# Patient Record
Sex: Female | Born: 1965
Health system: Southern US, Community
[De-identification: ages and names within clinical notes are randomized; demographics above are authoritative.]

## PROBLEM LIST (undated history)

## (undated) DIAGNOSIS — R079 Chest pain, unspecified: Secondary | ICD-10-CM

## (undated) DIAGNOSIS — F419 Anxiety disorder, unspecified: Secondary | ICD-10-CM

## (undated) DIAGNOSIS — A6000 Herpesviral infection of urogenital system, unspecified: Secondary | ICD-10-CM

## (undated) DIAGNOSIS — N920 Excessive and frequent menstruation with regular cycle: Secondary | ICD-10-CM

## (undated) DIAGNOSIS — N858 Other specified noninflammatory disorders of uterus: Secondary | ICD-10-CM

## (undated) DIAGNOSIS — D500 Iron deficiency anemia secondary to blood loss (chronic): Secondary | ICD-10-CM

## (undated) DIAGNOSIS — F32A Depression, unspecified: Secondary | ICD-10-CM

## (undated) DIAGNOSIS — F329 Major depressive disorder, single episode, unspecified: Secondary | ICD-10-CM

## (undated) DIAGNOSIS — D649 Anemia, unspecified: Secondary | ICD-10-CM

## (undated) DIAGNOSIS — I10 Essential (primary) hypertension: Secondary | ICD-10-CM

## (undated) HISTORY — DX: Other specified noninflammatory disorders of uterus: N85.8

## (undated) HISTORY — DX: Depression, unspecified: F32.A

## (undated) HISTORY — DX: Iron deficiency anemia secondary to blood loss (chronic): D50.0

## (undated) HISTORY — DX: Major depressive disorder, single episode, unspecified: F32.9

## (undated) HISTORY — DX: Anemia, unspecified: D64.9

## (undated) HISTORY — DX: Essential (primary) hypertension: I10

## (undated) HISTORY — DX: Herpesviral infection of urogenital system, unspecified: A60.00

## (undated) HISTORY — PX: TUBAL LIGATION: SHX77

## (undated) HISTORY — DX: Excessive and frequent menstruation with regular cycle: N92.0

---

## 1998-09-09 ENCOUNTER — Other Ambulatory Visit: Admission: RE | Admit: 1998-09-09 | Discharge: 1998-09-09 | Payer: Self-pay | Admitting: *Deleted

## 2001-08-03 ENCOUNTER — Other Ambulatory Visit: Admission: RE | Admit: 2001-08-03 | Discharge: 2001-08-03 | Payer: Self-pay | Admitting: Internal Medicine

## 2001-08-14 ENCOUNTER — Encounter: Payer: Self-pay | Admitting: Emergency Medicine

## 2001-08-14 ENCOUNTER — Emergency Department (HOSPITAL_COMMUNITY): Admission: EM | Admit: 2001-08-14 | Discharge: 2001-08-14 | Payer: Self-pay | Admitting: Emergency Medicine

## 2001-09-28 ENCOUNTER — Encounter: Payer: Self-pay | Admitting: Emergency Medicine

## 2001-09-28 ENCOUNTER — Emergency Department (HOSPITAL_COMMUNITY): Admission: EM | Admit: 2001-09-28 | Discharge: 2001-09-28 | Payer: Self-pay | Admitting: Emergency Medicine

## 2002-03-29 ENCOUNTER — Ambulatory Visit (HOSPITAL_COMMUNITY): Admission: RE | Admit: 2002-03-29 | Discharge: 2002-03-29 | Payer: Self-pay | Admitting: *Deleted

## 2002-03-29 ENCOUNTER — Encounter (INDEPENDENT_AMBULATORY_CARE_PROVIDER_SITE_OTHER): Payer: Self-pay | Admitting: *Deleted

## 2003-06-16 ENCOUNTER — Encounter: Payer: Self-pay | Admitting: *Deleted

## 2003-06-16 ENCOUNTER — Other Ambulatory Visit: Admission: RE | Admit: 2003-06-16 | Discharge: 2003-06-16 | Payer: Self-pay | Admitting: *Deleted

## 2003-06-16 ENCOUNTER — Inpatient Hospital Stay (HOSPITAL_COMMUNITY): Admission: AD | Admit: 2003-06-16 | Discharge: 2003-06-16 | Payer: Self-pay | Admitting: *Deleted

## 2005-03-03 ENCOUNTER — Ambulatory Visit: Payer: Self-pay | Admitting: Internal Medicine

## 2006-02-16 ENCOUNTER — Ambulatory Visit: Payer: Self-pay | Admitting: Internal Medicine

## 2006-02-21 ENCOUNTER — Ambulatory Visit: Payer: Self-pay | Admitting: Internal Medicine

## 2008-08-19 LAB — CONVERTED CEMR LAB: Pap Smear: NORMAL

## 2010-03-24 ENCOUNTER — Ambulatory Visit: Payer: Self-pay | Admitting: Internal Medicine

## 2010-03-24 DIAGNOSIS — F329 Major depressive disorder, single episode, unspecified: Secondary | ICD-10-CM

## 2010-03-24 DIAGNOSIS — A6000 Herpesviral infection of urogenital system, unspecified: Secondary | ICD-10-CM | POA: Insufficient documentation

## 2010-05-26 ENCOUNTER — Ambulatory Visit: Payer: Self-pay | Admitting: Internal Medicine

## 2010-08-20 ENCOUNTER — Emergency Department (HOSPITAL_COMMUNITY): Admission: EM | Admit: 2010-08-20 | Discharge: 2010-08-20 | Payer: Self-pay | Admitting: Emergency Medicine

## 2011-01-18 NOTE — Assessment & Plan Note (Signed)
Summary: new / sliding fee 100% / #/ cd   Vital Signs:  Patient profile:   45 year old female Menstrual status:  regular LMP:     03/24/2010 Height:      67 inches (170.18 cm) Weight:      160.75 pounds (73.07 kg) BMI:     25.27 O2 Sat:      99 % on Room air Temp:     97.4 degrees F (36.33 degrees C) oral Pulse rate:   84 / minute Pulse rhythm:   regular Resp:     16 per minute BP sitting:   138 / 86  (left arm) Cuff size:   regular  Vitals Entered By: Brenton Grills (March 24, 2010 1:27 PM)  O2 Flow:  Room air CC: pt here to re establish care/new pt. pt concerned about BP and palpitations/aj, Depressive symptoms LMP (date): 03/24/2010     Menstrual Status regular Enter LMP: 03/24/2010 Last PAP Result normal   Primary Care Provider:  Etta Grandchild MD  CC:  pt here to re establish care/new pt. pt concerned about BP and palpitations/aj and Depressive symptoms.  History of Present Illness:  Depressive Symptoms      This is a 45 year old woman who presents with Depressive symptoms.  The symptoms began >1 year ago.  The severity is described as moderate.  The patient reports depressed mood, loss of interest/pleasure, significant weight loss, and insomnia, but denies significant weight gain, hypersomnia, psychomotor agitation, and psychomotor retardation.  The patient also reports fatigue or loss of energy, feelings of worthlessness, and diminished concentration.  The patient denies indecisiveness, thoughts of death, thoughts of suicide, suicidal intent, and suicidal plans.  The patient reports the following psychosocial stressors: recent divorce and major life changes.  The patient denies abnormally elevated mood, abnormally irritable mood, decreased need for sleep, increased talkativeness, distractibility, flight of ideas, increased goal-directed activity, and inflated self-esteem/ grandiosity.    Preventive Screening-Counseling & Management  Alcohol-Tobacco     Alcohol  drinks/day: 0     Smoking Status: never  Caffeine-Diet-Exercise     Does Patient Exercise: yes  Hep-HIV-STD-Contraception     Hepatitis Risk: no risk noted     HIV Risk: no risk noted     STD Risk: no risk noted     SBE monthly: yes      Sexual History:  not active.        Drug Use:  no.        Blood Transfusions:  no.    Medications Prior to Update: 1)  None  Current Medications (verified): 1)  Acyclovir 800 Mg Tabs (Acyclovir) .... One By Mouth Three Times A Day For Outbreaks 2)  One-A-Day Womens Prenatal 28-0.8 & 440 Mg Misc (Prenatal Vit-Fe Fum-Fa-Omega) .Marland Kitchen.. 1 Tab Daily 3)  Lexapro 10 Mg Tabs (Escitalopram Oxalate) .... One By Mouth Once Daily  Allergies (verified): No Known Drug Allergies  Past History:  Past Medical History: Genital Herpes  Past Surgical History: Denies surgical history  Family History: Family History Diabetes 1st degree relative Family History Hypertension  Social History: Occupation: Medical records at [employer] Single Never Smoked Alcohol use-no Drug use-no Regular exercise-yes Smoking Status:  never Hepatitis Risk:  no risk noted HIV Risk:  no risk noted STD Risk:  no risk noted Sexual History:  not active Blood Transfusions:  no Drug Use:  no Does Patient Exercise:  yes  Review of Systems  The patient complains of depression.  The patient denies anorexia, chest pain, syncope, dyspnea on exertion, peripheral edema, prolonged cough, headaches, hemoptysis, abdominal pain, suspicious skin lesions, and angioedema.    Physical Exam  General:  alert, well-developed, well-nourished, well-hydrated, appropriate dress, normal appearance, healthy-appearing, cooperative to examination, and good hygiene.   Head:  normocephalic, atraumatic, no abnormalities observed, and no abnormalities palpated.   Eyes:  vision grossly intact, pupils equal, pupils round, and pupils reactive to light.   Mouth:  Oral mucosa and oropharynx without lesions  or exudates.  Teeth in good repair. Neck:  supple, full ROM, no masses, no thyromegaly, no JVD, normal carotid upstroke, and no carotid bruits.   Lungs:  normal respiratory effort, no intercostal retractions, no accessory muscle use, normal breath sounds, no dullness, no fremitus, no crackles, and no wheezes.   Heart:  normal rate, regular rhythm, no murmur, no gallop, and no rub.   Abdomen:  soft, non-tender, normal bowel sounds, no distention, no masses, no guarding, no rigidity, no rebound tenderness, no abdominal hernia, no inguinal hernia, no hepatomegaly, and no splenomegaly.   Msk:  normal ROM, no joint tenderness, no joint swelling, no joint warmth, no redness over joints, no joint deformities, no joint instability, and no crepitation.   Pulses:  R and L carotid,radial,femoral,dorsalis pedis and posterior tibial pulses are full and equal bilaterally Extremities:  No clubbing, cyanosis, edema, or deformity noted with normal full range of motion of all joints.   Neurologic:  No cranial nerve deficits noted. Station and gait are normal. Plantar reflexes are down-going bilaterally. DTRs are symmetrical throughout. Sensory, motor and coordinative functions appear intact. Skin:  turgor normal, color normal, no rashes, no suspicious lesions, no ecchymoses, no petechiae, no purpura, no ulcerations, and no edema.   Cervical Nodes:  no anterior cervical adenopathy and no posterior cervical adenopathy.   Axillary Nodes:  no R axillary adenopathy and no L axillary adenopathy.   Psych:  Oriented X3, memory intact for recent and remote, not agitated, not suicidal, not homicidal, dysphoric affect, tearful, and slightly anxious.     Impression & Recommendations:  Problem # 1:  DEPRESSIVE DISORDER (ICD-311) Assessment New no labs were done at pt's request. Her updated medication list for this problem includes:    Lexapro 10 Mg Tabs (Escitalopram oxalate) ..... One by mouth once daily  Discussed  treatment options, including trial of antidpressant medication. Will refer to behavioral health. Follow-up call in in 24-48 hours and recheck in 2 weeks, sooner as needed. Patient agrees to call if any worsening of symptoms or thoughts of doing harm arise. Verified that the patient has no suicidal ideation at this time.   Problem # 2:  GENITAL HERPES (ICD-054.10) Assessment: Unchanged  Complete Medication List: 1)  Acyclovir 800 Mg Tabs (Acyclovir) .... One by mouth three times a day for outbreaks 2)  One-a-day Womens Prenatal 28-0.8 & 440 Mg Misc (Prenatal vit-fe fum-fa-omega) .Marland Kitchen.. 1 tab daily 3)  Lexapro 10 Mg Tabs (Escitalopram oxalate) .... One by mouth once daily  Patient Instructions: 1)  Please schedule a follow-up appointment in 1 month. Prescriptions: ACYCLOVIR 800 MG TABS (ACYCLOVIR) One by mouth three times a day for outbreaks  #30 x 11   Entered and Authorized by:   Etta Grandchild MD   Signed by:   Etta Grandchild MD on 03/24/2010   Method used:   Print then Give to Patient   RxID:   [id number] LEXAPRO 10 MG TABS (ESCITALOPRAM  OXALATE) One by mouth once daily  #105 x 0   Entered and Authorized by:   Etta Grandchild MD   Signed by:   Etta Grandchild MD on 03/24/2010   Method used:   Samples Given   RxID:   [id number]    Preventive Care Screening  Pap Smear:    Date:  08/19/2008    Results:  normal

## 2011-03-03 LAB — POCT I-STAT, CHEM 8
BUN: 6 mg/dL (ref 6–23)
Calcium, Ion: 1.15 mmol/L (ref 1.12–1.32)
Chloride: 103 mEq/L (ref 96–112)
Creatinine, Ser: 0.7 mg/dL (ref 0.4–1.2)
Glucose, Bld: 87 mg/dL (ref 70–99)
HCT: 42 % (ref 36.0–46.0)
Hemoglobin: 14.3 g/dL (ref 12.0–15.0)
Potassium: 3.7 mEq/L (ref 3.5–5.1)
Sodium: 139 mEq/L (ref 135–145)
TCO2: 26 mmol/L (ref 0–100)

## 2011-05-06 NOTE — Op Note (Signed)
[facility]  Patient:    Haley Harding, Haley Harding Visit Number: [id number] MRN: [id number]          Service Type: DSU Location: [facility] Attending Physician:  Pleas Koch Dictated by:   Georgina Peer, M.D. Proc. Date: 03/29/02 Admit Date:  03/29/2002                             Operative Report  PREOPERATIVE DIAGNOSES:       1. Irregular menstrual bleeding.                               2. Cervical polyp.                               3. Pelvic pain.  POSTOPERATIVE DIAGNOSES:      1. Irregular menstrual bleeding.                               2. Cervical polyp.                               3. Pelvic pain.  OPERATION:                    Diagnostic hysteroscopy, polypectomy, dilation and curettage, and diagnostic laparoscopy.  SURGEON:                      Georgina Peer, M.D.  ANESTHESIA:                   General.  ESTIMATED BLOOD LOSS:         Less than 25 cc.  COMPLICATIONS:                None.  FINDINGS:                     Two endocervical polyps, normal endometrial cavity, boggy uterus with normal pelvis, tubes, ovaries and appendix.  INDICATIONS:                  This is a 45 year old gravida 3, para 3, status post tubal ligation with a multi-year history of worsening pelvic pain, dysmenorrhea and dyspareunia.  The patient began having irregular bleeding and had normal ultrasound.  A small endocervical polyp was noted at the external os on examination.  The patient was brought in for diagnosis and treatment.  DESCRIPTION OF PROCEDURE:     The patient was taken to the operating room and given a general anesthetic, placed in the dorsal lithotomy position.  Abdomen, perineum and vagina were prepped and draped in the normal sterile fashion. Catheter emptied the bladder, and the bimanual examination revealed a retroflexed uterus with no adnexal masses.  The cervix was identified with the speculum and grasped with the tenaculum.  A  small endocervical polyp was removed with polyp forceps.  The cervix was then sounded to 8.5 cm and dilated to a 24 [place] with [employer] dilators.  Diagnostic hysteroscope was then placed, and there was a small endocervical polyp which was seen in the mid cervical cavity.  The lower uterine segment, fundus, tubal ostia area were all normal without evidence of submucous  myoma, polyp, septum or irregular tissue.  A curettage was performed and represented tissue recovered including a polypoid tissue thought to be this endocervical polyp.  A Hulka uterine manipulator was placed.  Laparoscopy was begun with injecting 3 cc of 0.25% Xylocaine into the subumbilical and suprapubic area and then making an incision through this local in the subumbilical area.  A Verres needle was placed and under low pressures 2.7 liters of carbon dioxide gas was insufflated to create a pneumoperitoneum.  The laparoscopic trocar and sleeve were introduced.  A second suprapubic trocar was introduced through the locally infiltrated skin. The following pelvic findings were noted:  There was a small amount of pneumo-omentum as a result of insufflation but no evidence of bowel injury or bleeding.  The appendix appeared normal.  The upper abdomen had a few small left upper abdominal adhesions to the omentum but otherwise was normal.  The surface of the bowel looked normal.  There was evidence of tubal ligation. The ends of the tubes looked normal.  The ovaries were normal.  The ovarian fossa were normal.  There was no evidence of cyst, tumor, adhesions, or endometriosis on any of the peritoneal surfaces.  The uterus appeared boggy and mottled and thickened but was mobile.  Photodocumentations of these findings were noted.  There was no pathology for treatment.  Therefore, the ports were removed, the air allowed to escape from the abdomen.  The incisions were closed with subcuticular Dexon.  The vaginal instruments were  removed. Sponge, needle and instrument counts were correct.  The patient was returned to the recovery area in stable condition.  She received Toradol 30 mg IV before reaching the recovery area. Dictated by:   Georgina Peer, M.D. Attending Physician:  Pleas Koch DD:  03/29/02 TD:  03/31/02 Job: [id number]

## 2011-06-08 LAB — HM PAP SMEAR: HM Pap smear: NORMAL

## 2011-10-07 ENCOUNTER — Emergency Department (HOSPITAL_COMMUNITY): Payer: PRIVATE HEALTH INSURANCE

## 2011-10-07 ENCOUNTER — Observation Stay (HOSPITAL_COMMUNITY)
Admission: EM | Admit: 2011-10-07 | Discharge: 2011-10-07 | Discharge status: Against Medical Advice | Payer: PRIVATE HEALTH INSURANCE | Attending: Emergency Medicine | Admitting: Emergency Medicine

## 2011-10-07 DIAGNOSIS — R079 Chest pain, unspecified: Principal | ICD-10-CM | POA: Insufficient documentation

## 2011-10-07 LAB — CK TOTAL AND CKMB (NOT AT ARMC)
CK, MB: 1.9 ng/mL (ref 0.3–4.0)
CK, MB: 2 ng/mL (ref 0.3–4.0)
Total CK: 97 U/L (ref 7–177)

## 2011-10-07 LAB — BASIC METABOLIC PANEL
Calcium: 9.6 mg/dL (ref 8.4–10.5)
GFR calc Af Amer: >90 mL/min (ref >90)
GFR calc non Af Amer: >90 mL/min (ref >90)
Potassium: 4 mEq/L (ref 3.5–5.1)
Sodium: 138 mEq/L (ref 135–145)

## 2011-10-07 LAB — CBC
MCHC: 33.1 g/dL (ref 30.0–36.0)
Platelets: 487 10*3/uL — ABNORMAL HIGH (ref 150–400)
RDW: 16.8 % — ABNORMAL HIGH (ref 11.5–15.5)
WBC: 7.3 10*3/uL (ref 4.0–10.5)

## 2011-10-07 LAB — DIFFERENTIAL
Basophils Absolute: 0.1 10*3/uL (ref 0.0–0.1)
Basophils Relative: 1 % (ref 0–1)
Eosinophils Relative: 1 % (ref 0–5)
Monocytes Absolute: 0.4 10*3/uL (ref 0.1–1.0)

## 2011-10-07 LAB — D-DIMER, QUANTITATIVE: D-Dimer, Quant: 0.78 ug/mL-FEU — ABNORMAL HIGH (ref 0.00–0.48)

## 2011-10-07 LAB — POCT I-STAT TROPONIN I: Troponin i, poc: 0.02 ng/mL (ref 0.00–0.08)

## 2011-10-07 MED ORDER — IOHEXOL 300 MG/ML  SOLN
100.0000 mL | Freq: Once | INTRAMUSCULAR | Status: AC | PRN
  Administered 2011-10-07: 100 mL via INTRAVENOUS

## 2011-10-26 ENCOUNTER — Encounter: Payer: Self-pay | Admitting: Cardiovascular Disease

## 2011-10-26 ENCOUNTER — Ambulatory Visit (INDEPENDENT_AMBULATORY_CARE_PROVIDER_SITE_OTHER): Payer: PRIVATE HEALTH INSURANCE | Admitting: Cardiovascular Disease

## 2011-10-26 VITALS — BP 153/101 | HR 92 | Ht 68.0 in | Wt 177.0 lb

## 2011-10-26 DIAGNOSIS — R002 Palpitations: Secondary | ICD-10-CM | POA: Insufficient documentation

## 2011-10-26 DIAGNOSIS — I10 Essential (primary) hypertension: Secondary | ICD-10-CM | POA: Insufficient documentation

## 2011-10-26 MED ORDER — METOPROLOL TARTRATE 25 MG PO TABS: 25.0000 mg | ORAL_TABLET | Freq: Two times a day (BID) | ORAL | Status: DC

## 2011-10-26 NOTE — Patient Instructions (Signed)
Your physician recommends that you schedule a follow-up appointment in: 3 weeks.   Your physician has requested that you have an echocardiogram. Echocardiography is a painless test that uses sound waves to create images of your heart. It provides your doctor with information about the size and shape of your heart and how well your heart's chambers and valves are working. This procedure takes approximately one hour. There are no restrictions for this procedure.   Your physician has recommended that you wear a holter monitor. Holter monitors are medical devices that record the heart's electrical activity. Doctors most often use these monitors to diagnose arrhythmias. Arrhythmias are problems with the speed or rhythm of the heartbeat. The monitor is a small, portable device. You can wear one while you do your normal daily activities. This is usually used to diagnose what is causing palpitations/syncope (passing out).  Your physician has recommended you make the following change in your medication: Start lopressor 25 mg by mouth twice daily.

## 2011-10-26 NOTE — Progress Notes (Signed)
History of Present Illness: 45 yo AAF with history of recently diagnosed HTN, depression who is referred today for evaluation of chest pain. She tells me that three weeks ago, she was doing gymnastics with her daughter and began to feel her heart racing and she felt dyspneic. She then felt a discomfort in her chest and left arm. She went into the ED at [facility]. Her cardiac enzymes were negative. Her d-dimer was elevated. CTA chest was negative for PE or other thoracic disease. She started a stress echo in the ED but she became frustrated and left AMA before the images and stress test were completed.   She tells me today that she is still having daily palpitations. She has been anxious. Some SOB and lower ext edema. No chest pain.   Past Medical History  Diagnosis Date  . Depression   . Herpes genitalia     Past Surgical History  Procedure Date  . None     No current outpatient prescriptions on file.    No Known Allergies  History   Social History  . Marital Status: Divorced    Spouse Name: N/A    Number of Children: 3  . Years of Education: N/A   Occupational History  .     Social History Main Topics  . Smoking status: Never Smoker   . Smokeless tobacco: Not on file  . Alcohol Use: No  . Drug Use: No  . Sexually Active: Not on file   Other Topics Concern  . Not on file   Social History Narrative  . No narrative on file    Family History  Problem Relation Age of Onset  . Hypertension Mother   . Hypertension Father   . Diabetes Father     Review of Systems:  As stated in the HPI and otherwise negative.   BP 153/101  Pulse 92  Ht 5\' 8"  (1.727 m)  Wt 177 lb (80.287 kg)  BMI 26.91 kg/m2  LMP 09/10/2011  Physical Examination: General: Well developed, well nourished, NAD HEENT: OP clear, mucus membranes moist SKIN: warm, dry. No rashes. Neuro: No focal deficits Musculoskeletal: Muscle strength 5/5 all ext Psychiatric: Mood and affect normal Neck: No  JVD, no carotid bruits, no thyromegaly, no lymphadenopathy. Lungs:Clear bilaterally, no wheezes, rhonci, crackles Cardiovascular: Regular rate and rhythm. No murmurs, gallops or rubs. Abdomen:Soft. Bowel sounds present. Non-tender.  Extremities: No lower extremity edema. Pulses are 2 + in the bilateral DP/PT.  EKG:NSR

## 2011-10-26 NOTE — Assessment & Plan Note (Signed)
Will start beta blocker. I will see her back in 3 weeks for BP check and f/u on studies.

## 2011-10-26 NOTE — Assessment & Plan Note (Signed)
Unclear etiology of palpitations. Will arrange 48 hour Holter monitor and echo to exclude structural heart disease. Will start Lopressor 25 mg po BID.

## 2011-11-09 ENCOUNTER — Encounter (INDEPENDENT_AMBULATORY_CARE_PROVIDER_SITE_OTHER): Payer: PRIVATE HEALTH INSURANCE

## 2011-11-09 ENCOUNTER — Ambulatory Visit (HOSPITAL_COMMUNITY): Payer: PRIVATE HEALTH INSURANCE | Attending: Cardiology | Admitting: Radiology

## 2011-11-09 DIAGNOSIS — I079 Rheumatic tricuspid valve disease, unspecified: Secondary | ICD-10-CM | POA: Insufficient documentation

## 2011-11-09 DIAGNOSIS — R072 Precordial pain: Secondary | ICD-10-CM | POA: Insufficient documentation

## 2011-11-09 DIAGNOSIS — I10 Essential (primary) hypertension: Secondary | ICD-10-CM | POA: Insufficient documentation

## 2011-11-09 DIAGNOSIS — R002 Palpitations: Secondary | ICD-10-CM | POA: Insufficient documentation

## 2011-11-18 ENCOUNTER — Telehealth: Payer: Self-pay | Admitting: Cardiovascular Disease

## 2011-11-18 NOTE — Telephone Encounter (Signed)
Correct number is [phone]. Left message for pt to call back

## 2011-11-18 NOTE — Telephone Encounter (Signed)
Fu call  Pt returning your call Please call her back at work

## 2011-11-18 NOTE — Telephone Encounter (Signed)
No answer on work phone listed. Will continue to try to reach pt

## 2011-12-02 NOTE — Telephone Encounter (Signed)
Left message on home number for pt to call back.

## 2011-12-08 ENCOUNTER — Encounter: Payer: Self-pay | Admitting: *Deleted

## 2011-12-08 NOTE — Telephone Encounter (Signed)
Letter sent to pt's home asking her to contact office to review results of tests

## 2011-12-16 ENCOUNTER — Telehealth: Payer: Self-pay | Admitting: Cardiovascular Disease

## 2011-12-16 NOTE — Telephone Encounter (Signed)
I spoke with the patient about her echo results. She is aware she will need a repeat in one year to follow up TR.

## 2011-12-16 NOTE — Telephone Encounter (Signed)
New Problem:     Patient called to receive the test results from her ultrasound that she had done on 11/09/11.

## 2011-12-26 NOTE — Telephone Encounter (Signed)
Message left on pt's identified voice mail of monitor results. Left message to call back if questions.

## 2012-07-27 ENCOUNTER — Encounter: Payer: Self-pay | Admitting: Internal Medicine

## 2012-07-27 ENCOUNTER — Other Ambulatory Visit (INDEPENDENT_AMBULATORY_CARE_PROVIDER_SITE_OTHER): Payer: 59

## 2012-07-27 ENCOUNTER — Ambulatory Visit (INDEPENDENT_AMBULATORY_CARE_PROVIDER_SITE_OTHER): Payer: 59 | Admitting: Internal Medicine

## 2012-07-27 VITALS — BP 148/88 | HR 76 | Temp 96.9°F | Resp 16 | Ht 67.0 in | Wt 175.0 lb

## 2012-07-27 DIAGNOSIS — I10 Essential (primary) hypertension: Secondary | ICD-10-CM

## 2012-07-27 DIAGNOSIS — D539 Nutritional anemia, unspecified: Secondary | ICD-10-CM | POA: Insufficient documentation

## 2012-07-27 DIAGNOSIS — F329 Major depressive disorder, single episode, unspecified: Secondary | ICD-10-CM

## 2012-07-27 DIAGNOSIS — D500 Iron deficiency anemia secondary to blood loss (chronic): Secondary | ICD-10-CM | POA: Insufficient documentation

## 2012-07-27 DIAGNOSIS — D509 Iron deficiency anemia, unspecified: Secondary | ICD-10-CM

## 2012-07-27 DIAGNOSIS — A6000 Herpesviral infection of urogenital system, unspecified: Secondary | ICD-10-CM

## 2012-07-27 DIAGNOSIS — D649 Anemia, unspecified: Secondary | ICD-10-CM

## 2012-07-27 DIAGNOSIS — Z0000 Encounter for general adult medical examination without abnormal findings: Secondary | ICD-10-CM

## 2012-07-27 DIAGNOSIS — Z1231 Encounter for screening mammogram for malignant neoplasm of breast: Secondary | ICD-10-CM

## 2012-07-27 DIAGNOSIS — F3289 Other specified depressive episodes: Secondary | ICD-10-CM

## 2012-07-27 LAB — COMPREHENSIVE METABOLIC PANEL
Albumin: 4 g/dL (ref 3.5–5.2)
Alkaline Phosphatase: 105 U/L (ref 39–117)
BUN: 14 mg/dL (ref 6–23)
Glucose, Bld: 92 mg/dL (ref 70–99)
Potassium: 4.2 mEq/L (ref 3.5–5.1)
Total Bilirubin: 0.3 mg/dL (ref 0.3–1.2)

## 2012-07-27 LAB — URINALYSIS, ROUTINE W REFLEX MICROSCOPIC
Bilirubin Urine: NEGATIVE
Leukocytes, UA: NEGATIVE
Nitrite: NEGATIVE
pH: 7 (ref 5.0–8.0)

## 2012-07-27 LAB — VITAMIN B12: Vitamin B-12: 376 pg/mL (ref 211–911)

## 2012-07-27 LAB — CBC WITH DIFFERENTIAL/PLATELET
Basophils Relative: 1.4 % (ref 0.0–3.0)
Eosinophils Relative: 1.3 % (ref 0.0–5.0)
HCT: 30.8 % — ABNORMAL LOW (ref 36.0–46.0)
Hemoglobin: 9.5 g/dL — ABNORMAL LOW (ref 12.0–15.0)
Lymphs Abs: 1.8 10*3/uL (ref 0.7–4.0)
MCV: 74.6 fl — ABNORMAL LOW (ref 78.0–100.0)
Monocytes Absolute: 0.4 10*3/uL (ref 0.1–1.0)
Neutro Abs: 4 10*3/uL (ref 1.4–7.7)
RBC: 4.13 Mil/uL (ref 3.87–5.11)
WBC: 6.5 10*3/uL (ref 4.5–10.5)

## 2012-07-27 LAB — LIPID PANEL
HDL: 69.2 mg/dL (ref >39.00)
LDL Cholesterol: 94 mg/dL (ref 0–99)
Total CHOL/HDL Ratio: 2
Triglycerides: 28 mg/dL (ref 0.0–149.0)

## 2012-07-27 LAB — IBC PANEL: Iron: 11 ug/dL — ABNORMAL LOW (ref 42–145)

## 2012-07-27 MED ORDER — FERROUS SULFATE 325 (65 FE) MG PO TABS: 325.0000 mg | ORAL_TABLET | Freq: Three times a day (TID) | ORAL | Status: DC

## 2012-07-27 MED ORDER — METOPROLOL-HCTZ ER 50-12.5 MG PO TB24: 1.0000 | ORAL_TABLET | Freq: Every day | ORAL | Status: DC

## 2012-07-27 MED ORDER — VILAZODONE HCL 10 & 20 & 40 MG PO KIT: 1.0000 | PACK | Freq: Every day | ORAL | Status: DC

## 2012-07-27 MED ORDER — ACYCLOVIR 800 MG PO TABS: 800.0000 mg | ORAL_TABLET | Freq: Three times a day (TID) | ORAL | Status: DC

## 2012-07-27 NOTE — Assessment & Plan Note (Signed)
I will check her CBC and will look at her vitamin levels 

## 2012-07-27 NOTE — Assessment & Plan Note (Signed)
Start [employer]

## 2012-07-27 NOTE — Assessment & Plan Note (Signed)
Her BP is not well controlled so I have changed her to Dutoprol, today I will check her labs to look for end organ damage and secondary causes if HTN.

## 2012-07-27 NOTE — Assessment & Plan Note (Signed)
Start Viibryd and continue psychotherapy

## 2012-07-27 NOTE — Assessment & Plan Note (Signed)
Exam done, vaccines were reviewed, labs ordered, mammo ordered, pt ed material was given

## 2012-07-27 NOTE — Progress Notes (Signed)
Subjective:    Patient ID: Haley Harding, female    DOB: 1966-06-28, 46 y.o.   MRN: [id number]  Anemia Presents for follow-up visit. Symptoms include malaise/fatigue and pica. There has been no abdominal pain, anorexia, bruising/bleeding easily, confusion, fever, light-headedness, pallor, palpitations, paresthesias or weight loss. Signs of blood loss that are present include menorrhagia and vaginal bleeding. Signs of blood loss that are not present include hematemesis, hematochezia and melena. Compliance problems include psychosocial issues.  Compliance with medications is 0-25%.      Review of Systems  Constitutional: Positive for malaise/fatigue and fatigue. Negative for fever, chills, weight loss, diaphoresis, activity change, appetite change and unexpected weight change.  HENT: Negative.   Eyes: Negative.   Respiratory: Negative for cough, chest tightness, shortness of breath, wheezing and stridor.   Cardiovascular: Negative for chest pain, palpitations and leg swelling.  Gastrointestinal: Negative for nausea, vomiting, abdominal pain, diarrhea, constipation, blood in stool, melena, hematochezia, abdominal distention, anal bleeding, rectal pain, anorexia and hematemesis.  Genitourinary: Positive for vaginal bleeding, genital sores and menorrhagia. Negative for dysuria, urgency, frequency, hematuria, flank pain, decreased urine volume, vaginal discharge, enuresis, difficulty urinating, vaginal pain, menstrual problem, pelvic pain and dyspareunia.  Musculoskeletal: Negative for myalgias, back pain, joint swelling, arthralgias and gait problem.  Skin: Negative for color change, pallor, rash and wound.  Neurological: Negative for dizziness, tremors, syncope, weakness, light-headedness and paresthesias.  Hematological: Negative for adenopathy. Does not bruise/bleed easily.  Psychiatric/Behavioral: Positive for disturbed wake/sleep cycle and dysphoric mood (anhedonia, crying spells, feels  overwhelmed). Negative for suicidal ideas, hallucinations, behavioral problems, confusion, self-injury, decreased concentration and agitation. The patient is not nervous/anxious and is not hyperactive.        Objective:   Physical Exam  Vitals reviewed. Constitutional: She is oriented to person, place, and time. She appears well-developed and well-nourished. No distress.  HENT:  Head: Normocephalic and atraumatic.  Mouth/Throat: Oropharynx is clear and moist. Mucous membranes are pale, not dry and not cyanotic. No oropharyngeal exudate, posterior oropharyngeal edema, posterior oropharyngeal erythema or tonsillar abscesses.  Eyes: Conjunctivae are normal. Right eye exhibits no discharge. Left eye exhibits no discharge. No scleral icterus.  Neck: Normal range of motion. Neck supple. No JVD present. No tracheal deviation present. No thyromegaly present.  Cardiovascular: Normal rate, regular rhythm, normal heart sounds and intact distal pulses.  Exam reveals no gallop and no friction rub.   No murmur heard. Pulmonary/Chest: Effort normal and breath sounds normal. No stridor. No respiratory distress. She has no wheezes. She has no rales. She exhibits no tenderness.  Abdominal: Soft. Bowel sounds are normal. She exhibits no distension and no mass. There is no tenderness. There is no rebound and no guarding.  Musculoskeletal: Normal range of motion. She exhibits no edema and no tenderness.  Lymphadenopathy:    She has no cervical adenopathy.  Neurological: She is alert and oriented to person, place, and time. She has normal reflexes. She displays normal reflexes. No cranial nerve deficit. She exhibits normal muscle tone. Coordination normal.  Skin: Skin is warm and dry. No rash noted. She is not diaphoretic. No erythema. No pallor.  Psychiatric: Her speech is normal and behavior is normal. Judgment and thought content normal. Her mood appears not anxious. Her affect is not angry, not blunt, not labile  and not inappropriate. Cognition and memory are normal. She exhibits a depressed mood (tearful).      Lab Results  Component Value Date   WBC 7.3 10/07/2011   HGB  11.4* 10/07/2011   HCT 34.4* 10/07/2011   PLT 487* 10/07/2011   GLUCOSE 95 10/07/2011   NA 138 10/07/2011   K 4.0 10/07/2011   CL 104 10/07/2011   CREATININE 0.62 10/07/2011   BUN 13 10/07/2011   CO2 23 10/07/2011      Assessment & Plan:

## 2012-07-27 NOTE — Assessment & Plan Note (Signed)
New RX for acyclovir

## 2012-07-27 NOTE — Patient Instructions (Signed)
Iron Deficiency Anemia There are many types of anemia. Iron deficiency anemia is the most common. Iron deficiency anemia is a decrease in the number of red blood cells caused by too little iron. Without enough iron, your body does not produce enough hemoglobin. Hemoglobin is a substance in red blood cells that carries oxygen to the body's tissues. Iron deficiency anemia may leave you tired and short of breath. CAUSES   Lack of iron in the diet.   This may be seen in infants and children, because there is little iron in milk.   This may be seen in adults who do not eat enough iron-rich foods.   This may be seen in pregnant or breastfeeding women who do not take iron supplements. There is a much higher need for iron intake at these times.   Poor absorption of iron, as seen with intestinal disorders.   Intestinal bleeding.   Heavy periods.  SYMPTOMS  Mild anemia may not be noticeable. Symptoms may include:  Fatigue.   Headache.   Pale skin.   Weakness.   Shortness of breath.   Dizziness.   Cold hands and feet.   Fast or irregular heartbeat.  DIAGNOSIS  Diagnosis requires a thorough evaluation and physical exam by your caregiver.  Blood tests are generally used to confirm iron deficiency anemia.   Additional tests may be done to find the underlying cause of your anemia. These may include:   Testing for blood in the stool (fecal occult blood test).   A procedure to see inside the colon and rectum (colonoscopy).   A procedure to see inside the esophagus and stomach (endoscopy).  TREATMENT   Correcting the cause of the iron deficiency is the first step.   Medicines, such as oral contraceptives, can make heavy menstrual flows lighter.   Antibiotics and other medicines can be used to treat peptic ulcers.   Surgery may be needed to remove a bleeding polyp, tumor, or fibroid.   Often, iron supplements (ferrous sulfate) are taken.   For the best iron absorption, take  these supplements with an empty stomach.   You may need to take the supplements with food if you cannot tolerate them on an empty stomach. Vitamin C improves the absorption of iron. Your caregiver may recommend taking your iron tablets with a glass of orange juice or vitamin C supplement.   Milk and antacids should not be taken at the same time as iron supplements. They may interfere with the absorption of iron.   Iron supplements can cause constipation. A stool softener is often recommended.   Pregnant and breastfeeding women will need to take extra iron, because their normal diet usually will not provide the required amount.   Patients who cannot tolerate iron by mouth can take it through a vein (intravenously) or by an injection into the muscle.  HOME CARE INSTRUCTIONS   Ask your dietitian for help with diet questions.   Take iron and vitamins as directed by your caregiver.   Eat a diet rich in iron. Eat liver, lean beef, whole-grain bread, eggs, dried fruit, and dark green leafy vegetables.  SEEK IMMEDIATE MEDICAL CARE IF:   You have a fainting episode. Do not drive yourself. Call your local emergency services (911 in [place] if no other help is available.   You have chest pain, nausea, or vomiting.   You develop severe or increased shortness of breath with activities.   You develop weakness or increased thirst.   You have   a rapid heartbeat.   You develop unexplained sweating or become lightheaded when getting up from a chair or bed.  MAKE SURE YOU:   Understand these instructions.   Will watch your condition.   Will get help right away if you are not doing well or get worse.  Document Released: 12/02/2000 Document Revised: 11/24/2011 Document Reviewed: 04/13/2010 [facility] Patient Information 2012 [place], [place].Hypertension As your heart beats, it forces blood through your arteries. This force is your blood pressure. If the pressure is too high, it is called hypertension  (HTN) or high blood pressure. HTN is dangerous because you may have it and not know it. High blood pressure may mean that your heart has to work harder to pump blood. Your arteries may be narrow or stiff. The extra work puts you at risk for heart disease, stroke, and other problems.  Blood pressure consists of two numbers, a higher number over a lower, 110/72, for example. It is stated as "110 over 72." The ideal is below 120 for the top number (systolic) and under 80 for the bottom (diastolic). Write down your blood pressure today. You should pay close attention to your blood pressure if you have certain conditions such as:  Heart failure.   Prior heart attack.   Diabetes   Chronic kidney disease.   Prior stroke.   Multiple risk factors for heart disease.  To see if you have HTN, your blood pressure should be measured while you are seated with your arm held at the level of the heart. It should be measured at least twice. A one-time elevated blood pressure reading (especially in the Emergency Department) does not mean that you need treatment. There may be conditions in which the blood pressure is different between your right and left arms. It is important to see your caregiver soon for a recheck. Most people have essential hypertension which means that there is not a specific cause. This type of high blood pressure may be lowered by changing lifestyle factors such as:  Stress.   Smoking.   Lack of exercise.   Excessive weight.   Drug/tobacco/alcohol use.   Eating less salt.  Most people do not have symptoms from high blood pressure until it has caused damage to the body. Effective treatment can often prevent, delay or reduce that damage. TREATMENT  When a cause has been identified, treatment for high blood pressure is directed at the cause. There are a large number of medications to treat HTN. These fall into several categories, and your caregiver will help you select the medicines  that are best for you. Medications may have side effects. You should review side effects with your caregiver. If your blood pressure stays high after you have made lifestyle changes or started on medicines,   Your medication(s) may need to be changed.   Other problems may need to be addressed.   Be certain you understand your prescriptions, and know how and when to take your medicine.   Be sure to follow up with your caregiver within the time frame advised (usually within two weeks) to have your blood pressure rechecked and to review your medications.   If you are taking more than one medicine to lower your blood pressure, make sure you know how and at what times they should be taken. Taking two medicines at the same time can result in blood pressure that is too low.  SEEK IMMEDIATE MEDICAL CARE IF:  You develop a severe headache, blurred or changing vision,  or confusion.   You have unusual weakness or numbness, or a faint feeling.   You have severe chest or abdominal pain, vomiting, or breathing problems.  MAKE SURE YOU:   Understand these instructions.   Will watch your condition.   Will get help right away if you are not doing well or get worse.  Document Released: 12/05/2005 Document Revised: 11/24/2011 Document Reviewed: 07/25/2008 [facility] Patient Information 2012 [place], [place].

## 2012-08-22 ENCOUNTER — Ambulatory Visit (HOSPITAL_COMMUNITY)
Admission: RE | Admit: 2012-08-22 | Discharge: 2012-08-22 | Disposition: A | Discharge status: Home / Self Care | Payer: 59 | Source: Ambulatory Visit | Attending: Internal Medicine | Admitting: Internal Medicine

## 2012-08-22 DIAGNOSIS — Z1231 Encounter for screening mammogram for malignant neoplasm of breast: Secondary | ICD-10-CM | POA: Insufficient documentation

## 2012-08-24 ENCOUNTER — Other Ambulatory Visit: Payer: Self-pay | Admitting: Internal Medicine

## 2012-08-24 DIAGNOSIS — R928 Other abnormal and inconclusive findings on diagnostic imaging of breast: Secondary | ICD-10-CM

## 2012-08-29 ENCOUNTER — Ambulatory Visit (INDEPENDENT_AMBULATORY_CARE_PROVIDER_SITE_OTHER): Payer: 59 | Admitting: Internal Medicine

## 2012-08-29 ENCOUNTER — Encounter: Payer: Self-pay | Admitting: Internal Medicine

## 2012-08-29 VITALS — BP 110/70 | HR 76 | Temp 98.4°F | Resp 16 | Wt 177.0 lb

## 2012-08-29 DIAGNOSIS — F329 Major depressive disorder, single episode, unspecified: Secondary | ICD-10-CM

## 2012-08-29 DIAGNOSIS — I10 Essential (primary) hypertension: Secondary | ICD-10-CM

## 2012-08-29 MED ORDER — DULOXETINE HCL 30 MG PO CPEP: 30.0000 mg | ORAL_CAPSULE | Freq: Every day | ORAL | Status: DC

## 2012-08-29 NOTE — Progress Notes (Signed)
  Subjective:    Patient ID: Haley Harding, female    DOB: 03-Aug-1966, 46 y.o.   MRN: [id number]  HPI  She returns for f/up and she tells me that she does not like the way Viibryd makes her feel so she wants to change to something else.  Review of Systems  Constitutional: Negative.   HENT: Negative.   Eyes: Negative.   Respiratory: Negative.   Cardiovascular: Negative.   Gastrointestinal: Negative.   Genitourinary: Negative.   Musculoskeletal: Negative.   Skin: Negative.   Neurological: Negative.   Hematological: Negative for adenopathy. Does not bruise/bleed easily.  Psychiatric/Behavioral: Positive for disturbed wake/sleep cycle and dysphoric mood. Negative for suicidal ideas, hallucinations, behavioral problems, confusion, self-injury and decreased concentration. The patient is not nervous/anxious and is not hyperactive.        Objective:   Physical Exam  Vitals reviewed. Constitutional: She is oriented to person, place, and time. She appears well-developed and well-nourished. No distress.  HENT:  Head: Normocephalic and atraumatic.  Mouth/Throat: No oropharyngeal exudate.  Eyes: Conjunctivae normal are normal. Right eye exhibits no discharge. Left eye exhibits no discharge. No scleral icterus.  Neck: Normal range of motion. Neck supple. No JVD present. No tracheal deviation present. No thyromegaly present.  Cardiovascular: Normal rate, regular rhythm, normal heart sounds and intact distal pulses.  Exam reveals no gallop and no friction rub.   No murmur heard. Pulmonary/Chest: Effort normal and breath sounds normal. No stridor. No respiratory distress. She has no wheezes. She has no rales. She exhibits no tenderness.  Abdominal: Soft. Bowel sounds are normal. She exhibits no distension and no mass. There is no tenderness. There is no rebound and no guarding.  Musculoskeletal: Normal range of motion. She exhibits no edema and no tenderness.  Lymphadenopathy:    She has no  cervical adenopathy.  Neurological: She is oriented to person, place, and time.  Skin: Skin is warm and dry. No rash noted. She is not diaphoretic. No erythema. No pallor.  Psychiatric: She has a normal mood and affect. Her behavior is normal. Judgment and thought content normal.      Lab Results  Component Value Date   WBC 6.5 07/27/2012   HGB 9.5* 07/27/2012   HCT 30.8* 07/27/2012   PLT 593.0* 07/27/2012   GLUCOSE 92 07/27/2012   CHOL 169 07/27/2012   TRIG 28.0 07/27/2012   HDL 69.20 07/27/2012   LDLCALC 94 07/27/2012   ALT 13 07/27/2012   AST 17 07/27/2012   NA 138 07/27/2012   K 4.2 07/27/2012   CL 104 07/27/2012   CREATININE 0.7 07/27/2012   BUN 14 07/27/2012   CO2 27 07/27/2012   TSH 0.85 07/27/2012      Assessment & Plan:

## 2012-08-29 NOTE — Assessment & Plan Note (Signed)
Her BP is well controlled 

## 2012-08-29 NOTE — Assessment & Plan Note (Signed)
Stop Viibryd and try cymbalta

## 2012-08-29 NOTE — Patient Instructions (Signed)

## 2012-09-07 ENCOUNTER — Ambulatory Visit
Admission: RE | Admit: 2012-09-07 | Discharge: 2012-09-07 | Disposition: A | Discharge status: Home / Self Care | Payer: 59 | Source: Ambulatory Visit | Attending: Internal Medicine | Admitting: Internal Medicine

## 2012-09-07 DIAGNOSIS — R928 Other abnormal and inconclusive findings on diagnostic imaging of breast: Secondary | ICD-10-CM

## 2012-09-09 LAB — HM MAMMOGRAPHY: HM Mammogram: NORMAL

## 2012-11-28 ENCOUNTER — Other Ambulatory Visit (INDEPENDENT_AMBULATORY_CARE_PROVIDER_SITE_OTHER): Payer: 59

## 2012-11-28 ENCOUNTER — Encounter: Payer: Self-pay | Admitting: Internal Medicine

## 2012-11-28 ENCOUNTER — Ambulatory Visit (INDEPENDENT_AMBULATORY_CARE_PROVIDER_SITE_OTHER): Payer: 59 | Admitting: Internal Medicine

## 2012-11-28 VITALS — BP 136/88 | HR 104 | Temp 98.5°F | Resp 16 | Wt 180.5 lb

## 2012-11-28 DIAGNOSIS — I10 Essential (primary) hypertension: Secondary | ICD-10-CM

## 2012-11-28 DIAGNOSIS — D509 Iron deficiency anemia, unspecified: Secondary | ICD-10-CM

## 2012-11-28 DIAGNOSIS — M7989 Other specified soft tissue disorders: Secondary | ICD-10-CM

## 2012-11-28 DIAGNOSIS — F329 Major depressive disorder, single episode, unspecified: Secondary | ICD-10-CM

## 2012-11-28 LAB — IBC PANEL
Iron: 35 ug/dL — ABNORMAL LOW (ref 42–145)
Saturation Ratios: 8 % — ABNORMAL LOW (ref 20.0–50.0)
Transferrin: 314.1 mg/dL (ref 212.0–360.0)

## 2012-11-28 LAB — BASIC METABOLIC PANEL WITH GFR
BUN: 11 mg/dL (ref 6–23)
CO2: 27 meq/L (ref 19–32)
Calcium: 8.7 mg/dL (ref 8.4–10.5)
Chloride: 105 meq/L (ref 96–112)
Creatinine, Ser: 0.7 mg/dL (ref 0.4–1.2)
GFR: 110.73 mL/min
Glucose, Bld: 103 mg/dL — ABNORMAL HIGH (ref 70–99)
Potassium: 3.6 meq/L (ref 3.5–5.1)
Sodium: 140 meq/L (ref 135–145)

## 2012-11-28 LAB — CBC WITH DIFFERENTIAL/PLATELET
Basophils Absolute: 0.1 10*3/uL (ref 0.0–0.1)
Basophils Relative: 0.7 % (ref 0.0–3.0)
Eosinophils Absolute: 0.1 10*3/uL (ref 0.0–0.7)
Lymphocytes Relative: 19.8 % (ref 12.0–46.0)
MCHC: 33.4 g/dL (ref 30.0–36.0)
Neutrophils Relative %: 71.1 % (ref 43.0–77.0)
Platelets: 532 10*3/uL — ABNORMAL HIGH (ref 150.0–400.0)
RBC: 3.65 Mil/uL — ABNORMAL LOW (ref 3.87–5.11)

## 2012-11-28 LAB — SEDIMENTATION RATE: Sed Rate: 27 mm/h — ABNORMAL HIGH (ref 0–22)

## 2012-11-28 MED ORDER — DULOXETINE HCL 60 MG PO CPEP: 60.0000 mg | ORAL_CAPSULE | Freq: Every day | ORAL | Status: DC

## 2012-11-28 MED ORDER — METOPROLOL-HCTZ ER 50-12.5 MG PO TB24: 1.0000 | ORAL_TABLET | Freq: Every day | ORAL | Status: DC

## 2012-11-28 NOTE — Progress Notes (Signed)
Subjective:    Patient ID: Haley Harding, female    DOB: [birth year], 46 y.o.   MRN: [id number]  HPI  She returns for f/up but complains of a 2 week history of pain, swelling, and bruising in her right upper arm with no preceding trauma or injury. The symptoms started in her right upper arm and have spread down to her right forearm.  Review of Systems  Constitutional: Negative.   HENT: Negative.   Respiratory: Negative for cough, chest tightness, shortness of breath, wheezing and stridor.   Cardiovascular: Negative for chest pain, palpitations and leg swelling.  Gastrointestinal: Negative for nausea, abdominal pain, diarrhea, constipation and blood in stool.  Genitourinary: Negative.   Musculoskeletal: Negative for myalgias, back pain, joint swelling, arthralgias and gait problem.  Skin: Negative for color change, pallor, rash and wound.  Neurological: Negative for dizziness, tremors, seizures, syncope, facial asymmetry, speech difficulty, weakness, light-headedness, numbness and headaches.  Hematological: Negative for adenopathy. Does not bruise/bleed easily.  Psychiatric/Behavioral: Positive for dysphoric mood (crying spells). Negative for suicidal ideas, hallucinations, behavioral problems, confusion, sleep disturbance, self-injury, decreased concentration and agitation. The patient is not nervous/anxious and is not hyperactive.        Objective:   Physical Exam  Vitals reviewed. Constitutional: She is oriented to person, place, and time. She appears well-developed and well-nourished. No distress.  HENT:  Head: Normocephalic and atraumatic.  Mouth/Throat: Oropharynx is clear and moist. No oropharyngeal exudate.  Eyes: Conjunctivae normal are normal. Right eye exhibits no discharge. Left eye exhibits no discharge. No scleral icterus.  Neck: Normal range of motion. Neck supple. No JVD present. No tracheal deviation present. No thyromegaly present.  Cardiovascular: Normal rate,  regular rhythm, normal heart sounds and intact distal pulses.  Exam reveals no gallop and no friction rub.   No murmur heard. Pulmonary/Chest: Effort normal and breath sounds normal. No stridor. No respiratory distress. She has no wheezes. She has no rales. She exhibits no tenderness.  Abdominal: Soft. Bowel sounds are normal. She exhibits no distension and no mass. There is no tenderness. There is no rebound and no guarding.  Musculoskeletal: Normal range of motion. She exhibits no edema and no tenderness.       Right forearm: She exhibits tenderness, swelling and deformity. She exhibits no bony tenderness, no edema and no laceration.       Arms: Lymphadenopathy:    She has no cervical adenopathy.    She has no axillary adenopathy.       Right axillary: No pectoral and no lateral adenopathy present.       Left axillary: No pectoral and no lateral adenopathy present.      Right: No supraclavicular and no epitrochlear adenopathy present.       Left: No supraclavicular and no epitrochlear adenopathy present.  Neurological: She is oriented to person, place, and time.  Skin: Skin is warm and dry. No rash noted. She is not diaphoretic. There is erythema. No pallor.  Psychiatric: She has a normal mood and affect. Her behavior is normal. Judgment and thought content normal.     Lab Results  Component Value Date   WBC 6.5 07/27/2012   HGB 9.5* 07/27/2012   HCT 30.8* 07/27/2012   PLT 593.0* 07/27/2012   GLUCOSE 92 07/27/2012   CHOL 169 07/27/2012   TRIG 28.0 07/27/2012   HDL 69.20 07/27/2012   LDLCALC 94 07/27/2012   ALT 13 07/27/2012   AST 17 07/27/2012   NA 138 07/27/2012  K 4.2 07/27/2012   CL 104 07/27/2012   CREATININE 0.7 07/27/2012   BUN 14 07/27/2012   CO2 27 07/27/2012   TSH 0.85 07/27/2012       Assessment & Plan:

## 2012-11-28 NOTE — Patient Instructions (Signed)
Phlebitis   Phlebitis is a redness, tenderness and soreness (inflammation) in a vein. This can occur in your arms, legs, or torso (trunk), as well as deeper inside your body.   CAUSES   Phlebitis can be triggered by multiple factors. These include:   Reduced (restricted) blood flow through your veins. This happens with prolonged bed rest, long distance travel, injury or surgery. Being overweight (obese) and pregnant can also restrict blood flow and lead to phlebitis.   Putting a catheter in the vein (intravenous or IV) and giving certain medications through in the vein (intravenously).   Cancer and cancer treatment.   Use of illegal intravenous drugs.   Inflammatory diseases.   Inherited (genetic) diseases that increase the risk for blood clots.   Hormone therapy (such as birth control pills).  SYMPTOMS   Red, tender, swollen, painful area on your skin.   Usually, the area will be long and narrow.   Low grade fever.   Significant firmness along the center of this area. This can indicate that a blood clot has formed.   Surrounding redness or a high fever, which can indicate an infection (cellulitis).  DIAGNOSIS   The appearance of your condition and your symptoms will cause your caregiver to suspect phlebitis. Usually, this is enough for a diagnosis.   Your caregiver may request blood tests or an ultrasound test of the area to be sure you do not have an infection or a blood clot. Blood tests and discussing your family history may also indicate if you have an underlying genetic disease that causes blood clots.   Occasionally, a piece of tissue is taken from the body (biopsy) if an unusual cause of phlebitis is suspected.  TREATMENT   Raise (elevate) the affected area above the level of the heart.   Apply a warm compress or heating pad for 20 minutes, 3 or 4 times a day. If you use an electric heating pad, follow the directions so you do not burn yourself.   Anti-inflammatory medications are usually recommended. Follow  your caregiver's directions.   Any IV catheter, if present, will be removed by your caregiver.   Your caregiver may prescribe medicines that kill germs (antibiotics) if an infection is present.   Your caregiver may recommend blood thinners if a blood clot is suspected or present.   Support stockings or bandages may be helpful, depending on the cause and location of the phlebitis.   Surgery may be needed to remove very damaged sections of vein, but this is rare.  HOME CARE INSTRUCTIONS   Take medications exactly as prescribed.   Follow up with your caregiver as directed.   Use support stockings or bandages if advised. These will speed healing and prevent recurrence.   If you are on blood thinners:   Do follow-up blood tests exactly as directed.   Check with your caregiver before using any new medications.   Wear a pendant to show that you are on blood thinners.   For phlebitis in the legs:   Avoid prolonged standing or bed rest.   Keep your legs moving. Raise your legs with sitting or lying.   Do not smoke.   Women, particularly those over the age of 35, should consider the risks and benefits of taking the contraceptive pill. This kind of hormone treatment can increase your risk for blood clots.  SEEK MEDICAL CARE IF:   You have unusual bruising or any bleeding problems.   Swelling or pain in your   affected arm or leg is not gradually improving.   You are on anti-inflammatory medication and you develop belly (abdominal) pain.  SEEK IMMEDIATE MEDICAL CARE IF:   An unexplained oral temperature above 100.5 F (38.1 C) develops.   You have sudden onset of chest pain or difficulty breathing.  Document Released: 11/29/2001 Document Revised: 02/27/2012 Document Reviewed: 08/31/2009   [employer] Patient Information 2013 [employer]

## 2012-11-29 ENCOUNTER — Encounter: Payer: Self-pay | Admitting: Internal Medicine

## 2012-11-29 NOTE — Assessment & Plan Note (Signed)
Her BP is well controlled 

## 2012-11-29 NOTE — Assessment & Plan Note (Signed)
I will check a d-dimer to see if she has a clot and an ESR to see if there is some vasculitis Also have asked her to get an u's done to check the extent of the inflammation She was given pt ed material about phlebitis

## 2012-11-29 NOTE — Assessment & Plan Note (Signed)
I will recheck her CBC and her iron level 

## 2012-11-29 NOTE — Assessment & Plan Note (Signed)
Will increase the cymbalta to 60 mg per day

## 2012-12-13 ENCOUNTER — Encounter (INDEPENDENT_AMBULATORY_CARE_PROVIDER_SITE_OTHER): Payer: 59

## 2012-12-13 DIAGNOSIS — L539 Erythematous condition, unspecified: Secondary | ICD-10-CM

## 2012-12-13 DIAGNOSIS — M7989 Other specified soft tissue disorders: Secondary | ICD-10-CM

## 2012-12-20 ENCOUNTER — Ambulatory Visit: Payer: 59 | Admitting: Internal Medicine

## 2012-12-31 ENCOUNTER — Other Ambulatory Visit (HOSPITAL_COMMUNITY)
Admission: RE | Admit: 2012-12-31 | Discharge: 2012-12-31 | Disposition: A | Discharge status: Home / Self Care | Payer: 59 | Source: Ambulatory Visit | Attending: Internal Medicine | Admitting: Internal Medicine

## 2012-12-31 ENCOUNTER — Encounter: Payer: Self-pay | Admitting: Internal Medicine

## 2012-12-31 ENCOUNTER — Other Ambulatory Visit (INDEPENDENT_AMBULATORY_CARE_PROVIDER_SITE_OTHER): Payer: 59

## 2012-12-31 ENCOUNTER — Ambulatory Visit (INDEPENDENT_AMBULATORY_CARE_PROVIDER_SITE_OTHER): Payer: 59 | Admitting: Internal Medicine

## 2012-12-31 VITALS — BP 128/86 | HR 86 | Temp 98.2°F | Resp 16 | Wt 183.8 lb

## 2012-12-31 DIAGNOSIS — Z113 Encounter for screening for infections with a predominantly sexual mode of transmission: Secondary | ICD-10-CM | POA: Insufficient documentation

## 2012-12-31 DIAGNOSIS — R10813 Right lower quadrant abdominal tenderness: Secondary | ICD-10-CM

## 2012-12-31 DIAGNOSIS — IMO0002 Reserved for concepts with insufficient information to code with codable children: Secondary | ICD-10-CM

## 2012-12-31 DIAGNOSIS — Z01419 Encounter for gynecological examination (general) (routine) without abnormal findings: Secondary | ICD-10-CM | POA: Insufficient documentation

## 2012-12-31 DIAGNOSIS — R002 Palpitations: Secondary | ICD-10-CM

## 2012-12-31 DIAGNOSIS — R6889 Other general symptoms and signs: Secondary | ICD-10-CM

## 2012-12-31 DIAGNOSIS — D509 Iron deficiency anemia, unspecified: Secondary | ICD-10-CM

## 2012-12-31 DIAGNOSIS — N898 Other specified noninflammatory disorders of vagina: Secondary | ICD-10-CM

## 2012-12-31 DIAGNOSIS — I10 Essential (primary) hypertension: Secondary | ICD-10-CM

## 2012-12-31 DIAGNOSIS — F329 Major depressive disorder, single episode, unspecified: Secondary | ICD-10-CM

## 2012-12-31 LAB — COMPREHENSIVE METABOLIC PANEL
Albumin: 3.7 g/dL (ref 3.5–5.2)
Alkaline Phosphatase: 103 U/L (ref 39–117)
BUN: 16 mg/dL (ref 6–23)
CO2: 30 mEq/L (ref 19–32)
Calcium: 8.7 mg/dL (ref 8.4–10.5)
Chloride: 101 mEq/L (ref 96–112)
Glucose, Bld: 88 mg/dL (ref 70–99)
Potassium: 3.7 mEq/L (ref 3.5–5.1)
Sodium: 136 mEq/L (ref 135–145)
Total Protein: 7.2 g/dL (ref 6.0–8.3)

## 2012-12-31 LAB — CBC WITH DIFFERENTIAL/PLATELET
Basophils Relative: 0.4 % (ref 0.0–3.0)
Eosinophils Absolute: 0.1 10*3/uL (ref 0.0–0.7)
Eosinophils Relative: 1.1 % (ref 0.0–5.0)
Lymphocytes Relative: 23.7 % (ref 12.0–46.0)
MCHC: 33.1 g/dL (ref 30.0–36.0)
MCV: 87 fl (ref 78.0–100.0)
Monocytes Absolute: 0.6 10*3/uL (ref 0.1–1.0)
Neutrophils Relative %: 67.5 % (ref 43.0–77.0)
Platelets: 520 10*3/uL — ABNORMAL HIGH (ref 150.0–400.0)
RBC: 3.91 Mil/uL (ref 3.87–5.11)
WBC: 7.8 10*3/uL (ref 4.5–10.5)

## 2012-12-31 LAB — URINALYSIS, ROUTINE W REFLEX MICROSCOPIC
Bilirubin Urine: NEGATIVE
Hgb urine dipstick: NEGATIVE
Ketones, ur: NEGATIVE
Nitrite: NEGATIVE
Urobilinogen, UA: 0.2 (ref 0.0–1.0)
pH: 6.5 (ref 5.0–8.0)

## 2012-12-31 LAB — WET PREP, GENITAL: Trich, Wet Prep: NONE SEEN

## 2012-12-31 MED ORDER — DULOXETINE HCL 60 MG PO CPEP: 60.0000 mg | ORAL_CAPSULE | Freq: Every day | ORAL | Status: DC

## 2012-12-31 NOTE — Assessment & Plan Note (Signed)
Repeat CBC today 

## 2012-12-31 NOTE — Assessment & Plan Note (Signed)
She is doing well on cymbalta

## 2012-12-31 NOTE — Assessment & Plan Note (Signed)
She is due for a cardiology f/up

## 2012-12-31 NOTE — Assessment & Plan Note (Signed)
She has s/s suspicious of appendicitis so I will check her CBC today and I have asked her to get a CT done as well Will also check her pregnancy, UTI, renal stones, etc

## 2012-12-31 NOTE — Progress Notes (Signed)
Subjective:    Patient ID: Haley Harding, female    DOB: 1966/01/04, 47 y.o.   MRN: [id number]  Abdominal Pain This is a recurrent problem. The current episode started 1 to 4 weeks ago. The onset quality is gradual. The problem occurs intermittently. The most recent episode lasted 1 day. The problem has been gradually worsening. The pain is located in the RUQ. The pain is at a severity of 1/10. The pain is mild. The quality of the pain is dull. The abdominal pain does not radiate. Pertinent negatives include no anorexia, arthralgias, belching, constipation, diarrhea, dysuria, fever, flatus, frequency, headaches, hematochezia, hematuria, melena, myalgias, nausea, vomiting or weight loss. Nothing aggravates the pain. The pain is relieved by nothing. She has tried nothing for the symptoms.  Palpitations  This is a chronic problem. The current episode started more than 1 year ago. The problem occurs intermittently. The problem has been gradually worsening. Nothing aggravates the symptoms. Pertinent negatives include no anxiety, chest fullness, chest pain, coughing, diaphoresis, dizziness, fever, irregular heartbeat, malaise/fatigue, nausea, near-syncope, numbness, shortness of breath, syncope, vomiting or weakness. She has tried beta blockers for the symptoms. The treatment provided mild relief. Her past medical history is significant for a valve disorder. There is no history of anemia, anxiety, drug use, heart disease or hyperthyroidism.      Review of Systems  Constitutional: Negative for fever, chills, weight loss, malaise/fatigue, diaphoresis, activity change, appetite change, fatigue and unexpected weight change.  HENT: Negative.   Eyes: Negative.   Respiratory: Negative for apnea, cough, choking, chest tightness, shortness of breath, wheezing and stridor.   Cardiovascular: Positive for palpitations. Negative for chest pain, leg swelling, syncope and near-syncope.  Gastrointestinal: Positive  for abdominal pain. Negative for nausea, vomiting, diarrhea, constipation, blood in stool, melena, hematochezia, abdominal distention, anal bleeding, rectal pain, anorexia and flatus.  Genitourinary: Positive for vaginal discharge. Negative for dysuria, urgency, frequency, hematuria, flank pain, decreased urine volume, vaginal bleeding, enuresis, difficulty urinating, genital sores, vaginal pain, menstrual problem, pelvic pain and dyspareunia.  Musculoskeletal: Negative for myalgias, back pain, joint swelling, arthralgias and gait problem.  Skin: Negative for color change, pallor, rash and wound.  Neurological: Negative for dizziness, tremors, seizures, syncope, facial asymmetry, speech difficulty, weakness, light-headedness, numbness and headaches.  Hematological: Negative for adenopathy. Does not bruise/bleed easily.  Psychiatric/Behavioral: Negative.        Objective:   Physical Exam  Vitals reviewed. Constitutional: She is oriented to person, place, and time. She appears well-developed and well-nourished.  Non-toxic appearance. She does not have a sickly appearance. She does not appear ill. No distress.  HENT:  Head: Normocephalic and atraumatic.  Mouth/Throat: Oropharynx is clear and moist. No oropharyngeal exudate.  Eyes: Conjunctivae normal are normal. Right eye exhibits no discharge. Left eye exhibits no discharge. No scleral icterus.  Neck: Normal range of motion. Neck supple. No JVD present. No tracheal deviation present. No thyromegaly present.  Cardiovascular: Normal rate, regular rhythm, normal heart sounds and intact distal pulses.  Exam reveals no gallop and no friction rub.   No murmur heard. Pulmonary/Chest: Effort normal and breath sounds normal. No stridor. No respiratory distress. She has no wheezes. She has no rales. She exhibits no tenderness.  Abdominal: Soft. Normal appearance and bowel sounds are normal. She exhibits no shifting dullness, no distension, no pulsatile  liver, no fluid wave, no abdominal bruit, no ascites, no pulsatile midline mass and no mass. There is no hepatosplenomegaly, splenomegaly or hepatomegaly. There is tenderness in the  right lower quadrant. There is guarding. There is no rigidity, no rebound, no CVA tenderness, no tenderness at McBurney's point and negative Murphy's sign. No hernia. Hernia confirmed negative in the ventral area, confirmed negative in the right inguinal area and confirmed negative in the left inguinal area.  Genitourinary: Rectum normal. Rectal exam shows no external hemorrhoid, no internal hemorrhoid, no fissure, no mass, no tenderness and anal tone normal. Guaiac negative stool. No labial fusion. There is no rash, tenderness, lesion or injury on the right labia. There is no rash, tenderness, lesion or injury on the left labia. Uterus is not deviated, not enlarged, not fixed and not tender. Cervix exhibits no motion tenderness, no discharge and no friability. Right adnexum displays tenderness. Right adnexum displays no mass and no fullness. Left adnexum displays no mass, no tenderness and no fullness. No erythema, tenderness or bleeding around the vagina. No foreign body around the vagina. No signs of injury around the vagina. Vaginal discharge (scant, white, foul-smelling) found.  Musculoskeletal: Normal range of motion. She exhibits no edema and no tenderness.  Lymphadenopathy:    She has no cervical adenopathy.       Right: No inguinal adenopathy present.       Left: No inguinal adenopathy present.  Neurological: She is alert and oriented to person, place, and time. She has normal reflexes. She displays normal reflexes. No cranial nerve deficit. She exhibits normal muscle tone.  Skin: Skin is warm and dry. No rash noted. She is not diaphoretic. No erythema. No pallor.  Psychiatric: She has a normal mood and affect. Her behavior is normal. Judgment and thought content normal.      Lab Results  Component Value Date    WBC 7.6 11/28/2012   HGB 10.6* 11/28/2012   HCT 31.6* 11/28/2012   PLT 532.0* 11/28/2012   GLUCOSE 103* 11/28/2012   CHOL 169 07/27/2012   TRIG 28.0 07/27/2012   HDL 69.20 07/27/2012   LDLCALC 94 07/27/2012   ALT 13 07/27/2012   AST 17 07/27/2012   NA 140 11/28/2012   K 3.6 11/28/2012   CL 105 11/28/2012   CREATININE 0.7 11/28/2012   BUN 11 11/28/2012   CO2 27 11/28/2012   TSH 0.85 07/27/2012  Mm [employer]ag [employer] R  09/07/2012  *RADIOLOGY REPORT*  Clinical Data:  The patient returns for evaluation of calcifications in the outer portion of the right breast noted on recent baseline screening mammogram dated 08/22/2012.  DIGITAL DIAGNOSTIC RIGHT LIMITED MAMMOGRAM  Comparison:  None.  Findings:  Magnification views demonstrate round calcifications in the right upper outer quadrant posteriorly.  These are felt very likely to be benign.  No suspicious linear or branching forms are noted.  Dermal calcifications are noted inferiorly on the right.  IMPRESSION: Probably benign calcifications in the right upper outer quadrant posteriorly.  RECOMMENDATION: 60-month follow-up right diagnostic mammogram.  BI-RADS CATEGORY 3:  Probably benign finding(s) - short interval follow-up suggested.   Original Report Authenticated By: Daryl Eastern, M.D.      Assessment & Plan:

## 2012-12-31 NOTE — Patient Instructions (Addendum)
Abdominal Pain  Abdominal pain can be caused by many things. Your caregiver decides the seriousness of your pain by an examination and possibly blood tests and X-rays. Many cases can be observed and treated at home. Most abdominal pain is not caused by a disease and will probably improve without treatment. However, in many cases, more time must pass before a clear cause of the pain can be found. Before that point, it may not be known if you need more testing, or if hospitalization or surgery is needed.  HOME CARE INSTRUCTIONS   · Do not take laxatives unless directed by your caregiver.  · Take pain medicine only as directed by your caregiver.  · Only take over-the-counter or prescription medicines for pain, discomfort, or fever as directed by your caregiver.  · Try a clear liquid diet (broth, tea, or water) for as long as directed by your caregiver. Slowly move to a bland diet as tolerated.  SEEK IMMEDIATE MEDICAL CARE IF:   · The pain does not go away.  · You have a fever.  · You keep throwing up (vomiting).  · The pain is felt only in portions of the abdomen. Pain in the right side could possibly be appendicitis. In an adult, pain in the left lower portion of the abdomen could be colitis or diverticulitis.  · You pass bloody or black tarry stools.  MAKE SURE YOU:   · Understand these instructions.  · Will watch your condition.  · Will get help right away if you are not doing well or get worse.  Document Released: 09/14/2005 Document Revised: 02/27/2012 Document Reviewed: 07/23/2008  Exit[employer]® Patient Information ©2013 [employer].

## 2012-12-31 NOTE — Assessment & Plan Note (Signed)
I will check her wet prep, PAP, GC, chlamydia today

## 2012-12-31 NOTE — Assessment & Plan Note (Signed)
Her BP is well controlled 

## 2013-01-01 ENCOUNTER — Encounter: Payer: Self-pay | Admitting: Internal Medicine

## 2013-01-01 ENCOUNTER — Telehealth: Payer: Self-pay | Admitting: *Deleted

## 2013-01-01 DIAGNOSIS — B9689 Other specified bacterial agents as the cause of diseases classified elsewhere: Secondary | ICD-10-CM

## 2013-01-01 DIAGNOSIS — A6000 Herpesviral infection of urogenital system, unspecified: Secondary | ICD-10-CM

## 2013-01-01 NOTE — Telephone Encounter (Signed)
Patient called to ask if she was suppose to get a antibiotic . States she was told had bacterial infection. Also would like to know if there is any cream that can be prescribed for a Herpes outbreak. Cb# cell [phone].

## 2013-01-02 MED ORDER — ACYCLOVIR 800 MG PO TABS: 800.0000 mg | ORAL_TABLET | Freq: Three times a day (TID) | ORAL | Status: DC

## 2013-01-02 MED ORDER — CLINDAMYCIN PHOSPHATE 2 % VA CREA: 1.0000 | TOPICAL_CREAM | Freq: Every day | VAGINAL | Status: DC

## 2013-01-02 NOTE — Telephone Encounter (Signed)
rx's have been sent in. 

## 2013-01-02 NOTE — Telephone Encounter (Signed)
Left message on patient work # and Cell # given of Rx sent to pharmacy.

## 2013-01-03 ENCOUNTER — Encounter: Payer: Self-pay | Admitting: Internal Medicine

## 2013-01-03 ENCOUNTER — Ambulatory Visit (INDEPENDENT_AMBULATORY_CARE_PROVIDER_SITE_OTHER)
Admission: RE | Admit: 2013-01-03 | Discharge: 2013-01-03 | Disposition: A | Discharge status: Home / Self Care | Payer: 59 | Source: Ambulatory Visit | Attending: Internal Medicine | Admitting: Internal Medicine

## 2013-01-03 DIAGNOSIS — IMO0002 Reserved for concepts with insufficient information to code with codable children: Secondary | ICD-10-CM | POA: Insufficient documentation

## 2013-01-03 DIAGNOSIS — R10813 Right lower quadrant abdominal tenderness: Secondary | ICD-10-CM

## 2013-01-03 DIAGNOSIS — N898 Other specified noninflammatory disorders of vagina: Secondary | ICD-10-CM

## 2013-01-03 MED ORDER — IOHEXOL 300 MG/ML  SOLN
100.0000 mL | Freq: Once | INTRAMUSCULAR | Status: AC | PRN
  Administered 2013-01-03: 100 mL via INTRAVENOUS

## 2013-01-03 NOTE — Addendum Note (Signed)
Addended by: Etta Grandchild on: 01/03/2013 03:00 PM   Modules accepted: Orders

## 2013-01-04 ENCOUNTER — Telehealth: Payer: Self-pay | Admitting: Internal Medicine

## 2013-01-04 NOTE — Telephone Encounter (Signed)
Patient left message on triage stating that she called on Tuesday and was told that her labs and pap were normal but today she got a call from [facility] trying to set her up an appointment for an abnormal pap, she wants to speak with someone ASAP about this results and her CT results.

## 2013-01-10 ENCOUNTER — Ambulatory Visit (INDEPENDENT_AMBULATORY_CARE_PROVIDER_SITE_OTHER): Payer: 59 | Admitting: Cardiovascular Disease

## 2013-01-10 ENCOUNTER — Encounter: Payer: Self-pay | Admitting: Cardiovascular Disease

## 2013-01-10 VITALS — BP 132/79 | HR 92 | Ht 68.0 in | Wt 181.6 lb

## 2013-01-10 DIAGNOSIS — I071 Rheumatic tricuspid insufficiency: Secondary | ICD-10-CM

## 2013-01-10 DIAGNOSIS — I079 Rheumatic tricuspid valve disease, unspecified: Secondary | ICD-10-CM

## 2013-01-10 DIAGNOSIS — R002 Palpitations: Secondary | ICD-10-CM

## 2013-01-10 NOTE — Progress Notes (Signed)
History of Present Illness: 47 yo AAF with history of HTN, depression who is here today for for cardiac follow up. I saw her in 2012 for evaluation of chest pain. She had been doing gymnastics with her daughter and began to feel her heart racing and she felt dyspneic. She then felt a discomfort in her chest and left arm. She went into the ED at [facility]. Her cardiac enzymes were negative. Her d-dimer was elevated. CTA chest was negative for PE or other thoracic disease. She started a stress echo in the ED but she became frustrated and left AMA before the images and stress test were completed. She told me that she was having daily palpitations. I arranged a 48 hour Holter monitor, echo and started a beta blocker. Echo showed normal LV size and function with moderate TR. Holter monitor with sinus rhythm, PACs and PVCs.   She is here today for f/u. She is doing well. No SOB. Rare palpitations. She does drink tea.   Primary Care Physician: Yetta Barre  Last Lipid Profile:Lipid Panel     Component Value Date/Time   CHOL 169 07/27/2012 0857   TRIG 28.0 07/27/2012 0857   HDL 69.20 07/27/2012 0857   CHOLHDL 2 07/27/2012 0857   VLDL 5.6 07/27/2012 0857   LDLCALC 94 07/27/2012 0857     Past Medical History  Diagnosis Date  . Depression   . Herpes genitalia   . Anemia   . Hypertension     Past Surgical History  Procedure Date  . None   . Tubal ligation     Current Outpatient Prescriptions  Medication Sig Dispense Refill  . acyclovir (ZOVIRAX) 800 MG tablet Take 1 tablet (800 mg total) by mouth 3 (three) times daily.  30 tablet  5  . clindamycin (CLEOCIN) 2 % vaginal cream Place 1 Applicatorful vaginally at bedtime.  40 g  1  . DULoxetine (CYMBALTA) 60 MG capsule Take 1 capsule (60 mg total) by mouth daily.  90 capsule  3  . ferrous sulfate (FEOSOL) 325 (65 FE) MG tablet Take 1 tablet (325 mg total) by mouth 3 (three) times daily with meals.  90 tablet  11  . Metoprolol-Hydrochlorothiazide (DUTOPROL)  50-12.5 MG TB24 Take 1 tablet by mouth daily.  90 tablet  3    No Known Allergies  History   Social History  . Marital Status: Divorced    Spouse Name: N/A    Number of Children: 3  . Years of Education: N/A   Occupational History  .     Social History Main Topics  . Smoking status: Never Smoker   . Smokeless tobacco: Never Used  . Alcohol Use: No  . Drug Use: No  . Sexually Active: Not Currently    Birth Control/ Protection: Surgical   Other Topics Concern  . Not on file   Social History Narrative  . No narrative on file    Family History  Problem Relation Age of Onset  . Hypertension Mother   . Hypertension Father   . Diabetes Father     Review of Systems:  As stated in the HPI and otherwise negative.   BP 132/79  Pulse 92  Ht 5\' 8"  (1.727 m)  Wt 181 lb 9.6 oz (82.373 kg)  BMI 27.61 kg/m2  LMP 12/24/2012  Physical Examination: General: Well developed, well nourished, NAD HEENT: OP clear, mucus membranes moist SKIN: warm, dry. No rashes. Neuro: No focal deficits Musculoskeletal: Muscle strength 5/5 all ext  Psychiatric: Mood and affect normal Neck: No JVD, no carotid bruits, no thyromegaly, no lymphadenopathy. Lungs:Clear bilaterally, no wheezes, rhonci, crackles Cardiovascular: Regular rate and rhythm. Slight systolic murmur. No gallops or rubs. Abdomen:Soft. Bowel sounds present. Non-tender.  Extremities: No lower extremity edema. Pulses are 2 + in the bilateral DP/PT.  Echo 11/09/11:  Left ventricle: The cavity size was normal. Wall thickness was increased in a pattern of mild LVH. Systolic function was normal. The estimated ejection fraction was in the range of 60% to 65%. Wall motion was normal; there were no regional wall motion abnormalities. Left ventricular diastolic function parameters were normal. - Tricuspid valve: Moderate regurgitation. - Pulmonary arteries: PA peak pressure: 38mm Hg (S).  EKG: NSR, rate 84 bpm. Non-specific T wave  abnormalities.   Assessment and Plan:   1. Palpitations: Likely due to PACs/PVCs. These are rare. Continue beta blocker. LV function is normal.   2. Tricuspid insufficiency: Moderate by echo 11/09/11. Will repeat echo.

## 2013-01-10 NOTE — Patient Instructions (Addendum)

## 2013-01-18 ENCOUNTER — Other Ambulatory Visit (HOSPITAL_COMMUNITY): Payer: 59

## 2013-01-21 ENCOUNTER — Encounter: Payer: 59 | Admitting: Obstetrics and Gynecology

## 2013-03-14 ENCOUNTER — Other Ambulatory Visit: Payer: Self-pay | Admitting: Obstetrics and Gynecology

## 2013-04-15 ENCOUNTER — Telehealth: Payer: Self-pay | Admitting: Internal Medicine

## 2013-04-15 ENCOUNTER — Encounter (HOSPITAL_COMMUNITY): Payer: Self-pay | Admitting: Emergency Medicine

## 2013-04-15 ENCOUNTER — Emergency Department (HOSPITAL_COMMUNITY)
Admission: EM | Admit: 2013-04-15 | Discharge: 2013-04-15 | Disposition: A | Discharge status: Home / Self Care | Payer: 59 | Attending: Emergency Medicine | Admitting: Emergency Medicine

## 2013-04-15 DIAGNOSIS — R110 Nausea: Secondary | ICD-10-CM | POA: Insufficient documentation

## 2013-04-15 DIAGNOSIS — D649 Anemia, unspecified: Secondary | ICD-10-CM | POA: Insufficient documentation

## 2013-04-15 DIAGNOSIS — Z79899 Other long term (current) drug therapy: Secondary | ICD-10-CM | POA: Insufficient documentation

## 2013-04-15 DIAGNOSIS — Z3202 Encounter for pregnancy test, result negative: Secondary | ICD-10-CM | POA: Insufficient documentation

## 2013-04-15 DIAGNOSIS — F329 Major depressive disorder, single episode, unspecified: Secondary | ICD-10-CM | POA: Insufficient documentation

## 2013-04-15 DIAGNOSIS — R1013 Epigastric pain: Secondary | ICD-10-CM | POA: Insufficient documentation

## 2013-04-15 DIAGNOSIS — K219 Gastro-esophageal reflux disease without esophagitis: Secondary | ICD-10-CM | POA: Insufficient documentation

## 2013-04-15 DIAGNOSIS — A6000 Herpesviral infection of urogenital system, unspecified: Secondary | ICD-10-CM | POA: Insufficient documentation

## 2013-04-15 DIAGNOSIS — E860 Dehydration: Secondary | ICD-10-CM | POA: Insufficient documentation

## 2013-04-15 DIAGNOSIS — I10 Essential (primary) hypertension: Secondary | ICD-10-CM | POA: Insufficient documentation

## 2013-04-15 DIAGNOSIS — F3289 Other specified depressive episodes: Secondary | ICD-10-CM | POA: Insufficient documentation

## 2013-04-15 LAB — CBC WITH DIFFERENTIAL/PLATELET
Basophils Absolute: 0.1 10*3/uL (ref 0.0–0.1)
Lymphocytes Relative: 30 % (ref 12–46)
Neutro Abs: 4.6 10*3/uL (ref 1.7–7.7)
Platelets: 511 10*3/uL — ABNORMAL HIGH (ref 150–400)
RDW: 16 % — ABNORMAL HIGH (ref 11.5–15.5)
WBC: 7.8 10*3/uL (ref 4.0–10.5)

## 2013-04-15 LAB — POCT PREGNANCY, URINE: Preg Test, Ur: NEGATIVE

## 2013-04-15 LAB — URINALYSIS, MICROSCOPIC ONLY
Glucose, UA: NEGATIVE mg/dL
Hgb urine dipstick: NEGATIVE
Protein, ur: NEGATIVE mg/dL
Specific Gravity, Urine: 1.031 — ABNORMAL HIGH (ref 1.005–1.030)

## 2013-04-15 LAB — COMPREHENSIVE METABOLIC PANEL
ALT: 16 U/L (ref 0–35)
AST: 20 U/L (ref 0–37)
CO2: 23 mEq/L (ref 19–32)
Chloride: 100 mEq/L (ref 96–112)
GFR calc non Af Amer: >90 mL/min (ref >90)
Potassium: 3.4 mEq/L — ABNORMAL LOW (ref 3.5–5.1)
Sodium: 136 mEq/L (ref 135–145)
Total Bilirubin: 0.4 mg/dL (ref 0.3–1.2)

## 2013-04-15 MED ORDER — ONDANSETRON HCL 4 MG/2ML IJ SOLN
4.0000 mg | Freq: Once | INTRAMUSCULAR | Status: AC
  Administered 2013-04-15: 4 mg via INTRAVENOUS
  Filled 2013-04-15: qty 2

## 2013-04-15 MED ORDER — OMEPRAZOLE 20 MG PO CPDR: DELAYED_RELEASE_CAPSULE | ORAL | Status: DC

## 2013-04-15 MED ORDER — GI COCKTAIL ~~LOC~~
30.0000 mL | Freq: Once | ORAL | Status: AC
  Administered 2013-04-15: 30 mL via ORAL
  Filled 2013-04-15: qty 30

## 2013-04-15 MED ORDER — ONDANSETRON 8 MG PO TBDP
8.0000 mg | ORAL_TABLET | Freq: Once | ORAL | Status: AC
  Administered 2013-04-15: 8 mg via ORAL
  Filled 2013-04-15: qty 1

## 2013-04-15 MED ORDER — ONDANSETRON 8 MG PO TBDP: 8.0000 mg | ORAL_TABLET | Freq: Three times a day (TID) | ORAL | Status: DC | PRN

## 2013-04-15 MED ORDER — SODIUM CHLORIDE 0.9 % IV BOLUS (SEPSIS)
1000.0000 mL | Freq: Once | INTRAVENOUS | Status: AC
  Administered 2013-04-15: 1000 mL via INTRAVENOUS

## 2013-04-15 MED ORDER — FAMOTIDINE IN NACL 20-0.9 MG/50ML-% IV SOLN
20.0000 mg | Freq: Once | INTRAVENOUS | Status: AC
  Administered 2013-04-15: 20 mg via INTRAVENOUS
  Filled 2013-04-15: qty 50

## 2013-04-15 NOTE — ED Provider Notes (Signed)
History     CSN: [id number]  Arrival date & time 04/15/13  1407   First MD Initiated Contact with Patient 04/15/13 1612      Chief Complaint  Patient presents with  . Nausea    (Consider location/radiation/quality/duration/timing/severity/associated sxs/prior treatment) HPI Patient reports about 5 days ago she had a luncheon at work and  about 2 hours later started getting nauseated. She states she ate baked chicken that was well cooked. She continued to have nausea the following couple of days and was unable to eat or drink. She did have vomiting yesterday about 4 or 5 times. She denies diarrhea. She also states she's had a aching epigastric abdominal pain the whole time that does not radiate. She continues to have nausea today without vomiting. She states she does feel dizzy and weak. She relates one other person felt ill after the lunch and also. She denies fever.   PCP Dr Leitha Schuller  Past Medical History  Diagnosis Date  . Depression   . Herpes genitalia   . Anemia   . Hypertension     Past Surgical History  Procedure Laterality Date  . None    . Tubal ligation      Family History  Problem Relation Age of Onset  . Hypertension Mother   . Hypertension Father   . Diabetes Father     History  Substance Use Topics  . Smoking status: Never Smoker   . Smokeless tobacco: Never Used  . Alcohol Use: No  employed  OB History   Grav Para Term Preterm Abortions TAB SAB Ect Mult Living                  Review of Systems  All other systems reviewed and are negative.    Allergies  Review of patient's allergies indicates no known allergies.  Home Medications   Current Outpatient Rx  Name  Route  Sig  Dispense  Refill  . acyclovir (ZOVIRAX) 800 MG tablet   Oral   Take 1 tablet (800 mg total) by mouth 3 (three) times daily.   30 tablet   5   . DULoxetine (CYMBALTA) 60 MG capsule   Oral   Take 1 capsule (60 mg total) by mouth daily.   90 capsule   3   .  ferrous sulfate (FEOSOL) 325 (65 FE) MG tablet   Oral   Take 1 tablet (325 mg total) by mouth 3 (three) times daily with meals.   90 tablet   11   . Metoprolol-Hydrochlorothiazide (DUTOPROL) 50-12.5 MG TB24   Oral   Take 1 tablet by mouth daily.   90 tablet   3     BP 149/83  Pulse 65  Temp(Src) 98.4 F (36.9 C) (Oral)  Resp 16  SpO2 100%  LMP 04/01/2013  Vital signs normal    Physical Exam  Nursing note and vitals reviewed. Constitutional: She is oriented to person, place, and time. She appears well-developed and well-nourished.  Non-toxic appearance. She does not appear ill. No distress.  HENT:  Head: Normocephalic and atraumatic.  Right Ear: External ear normal.  Left Ear: External ear normal.  Nose: Nose normal. No mucosal edema or rhinorrhea.  Mouth/Throat: Mucous membranes are normal. No dental abscesses or edematous.  Dry tongue  Eyes: Conjunctivae and EOM are normal. Pupils are equal, round, and reactive to light.  Neck: Normal range of motion and full passive range of motion without pain. Neck supple.  Cardiovascular: Normal rate,  regular rhythm and normal heart sounds.  Exam reveals no gallop and no friction rub.   No murmur heard. Pulmonary/Chest: Effort normal and breath sounds normal. No respiratory distress. She has no wheezes. She has no rhonchi. She has no rales. She exhibits no tenderness and no crepitus.  Abdominal: Soft. Normal appearance and bowel sounds are normal. She exhibits no distension. There is tenderness. There is no rebound and no guarding.    Mild tenderness  Musculoskeletal: Normal range of motion. She exhibits no edema and no tenderness.  Moves all extremities well.   Neurological: She is alert and oriented to person, place, and time. She has normal strength. No cranial nerve deficit.  Skin: Skin is warm, dry and intact. No rash noted. No erythema. No pallor.  Psychiatric: She has a normal mood and affect. Her speech is normal and  behavior is normal. Her mood appears not anxious.    ED Course  Procedures (including critical care time)  Medications  ondansetron (ZOFRAN-ODT) disintegrating tablet 8 mg (8 mg Oral Given 04/15/13 1435)  sodium chloride 0.9 % bolus 1,000 mL (0 mLs Intravenous Stopped 04/15/13 1927)  famotidine (PEPCID) IVPB 20 mg (0 mg Intravenous Stopped 04/15/13 1927)  ondansetron (ZOFRAN) injection 4 mg (4 mg Intravenous Given 04/15/13 1740)  sodium chloride 0.9 % bolus 1,000 mL (1,000 mLs Intravenous New Bag/Given 04/15/13 2041)  gi cocktail (Maalox,Lidocaine,Donnatal) (30 mLs Oral Given 04/15/13 2042)   Recheck at 20:00, still has some epigastric discomfort. Has not had urine output. Will give more IV fluids and try a GI cocktail  Recheck at discharge, she reports her nausea is gone, she has been drinking fluids, feels like she wants to eat crackers. Also her epigastric abdominal pain is gone.    Results for orders placed during the hospital encounter of 04/15/13  URINALYSIS, MICROSCOPIC ONLY      Result Value Range   Color, Urine AMBER (*) YELLOW   APPearance TURBID (*) CLEAR   Specific Gravity, Urine 1.031 (*) 1.005 - 1.030   pH 5.0  5.0 - 8.0   Glucose, UA NEGATIVE  NEGATIVE mg/dL   Hgb urine dipstick NEGATIVE  NEGATIVE   Bilirubin Urine SMALL (*) NEGATIVE   Ketones, ur NEGATIVE  NEGATIVE mg/dL   Protein, ur NEGATIVE  NEGATIVE mg/dL   Urobilinogen, UA 1.0  0.0 - 1.0 mg/dL   Nitrite NEGATIVE  NEGATIVE   Leukocytes, UA NEGATIVE  NEGATIVE   Bacteria, UA FEW (*) RARE   Squamous Epithelial / LPF FEW (*) RARE   Urine-Other MUCOUS PRESENT    CBC WITH DIFFERENTIAL      Result Value Range   WBC 7.8  4.0 - 10.5 K/uL   RBC 4.70  3.87 - 5.11 MIL/uL   Hemoglobin 11.0 (*) 12.0 - 15.0 g/dL   HCT 16.1 (*) 09.6 - 04.5 %   MCV 76.0 (*) 78.0 - 100.0 fL   MCH 23.4 (*) 26.0 - 34.0 pg   MCHC 30.8  30.0 - 36.0 g/dL   RDW 40.9 (*) 81.1 - 91.4 %   Platelets 511 (*) 150 - 400 K/uL   Neutrophils Relative 59   43 - 77 %   Neutro Abs 4.6  1.7 - 7.7 K/uL   Lymphocytes Relative 30  12 - 46 %   Lymphs Abs 2.3  0.7 - 4.0 K/uL   Monocytes Relative 10  3 - 12 %   Monocytes Absolute 0.8  0.1 - 1.0 K/uL   Eosinophils Relative 1  0 -  5 %   Eosinophils Absolute 0.1  0.0 - 0.7 K/uL   Basophils Relative 1  0 - 1 %   Basophils Absolute 0.1  0.0 - 0.1 K/uL  COMPREHENSIVE METABOLIC PANEL      Result Value Range   Sodium 136  135 - 145 mEq/L   Potassium 3.4 (*) 3.5 - 5.1 mEq/L   Chloride 100  96 - 112 mEq/L   CO2 23  19 - 32 mEq/L   Glucose, Bld 73  70 - 99 mg/dL   BUN 14  6 - 23 mg/dL   Creatinine, Ser 1.61  0.50 - 1.10 mg/dL   Calcium 9.8  8.4 - 09.6 mg/dL   Total Protein 8.4 (*) 6.0 - 8.3 g/dL   Albumin 4.2  3.5 - 5.2 g/dL   AST 20  0 - 37 U/L   ALT 16  0 - 35 U/L   Alkaline Phosphatase 107  39 - 117 U/L   Total Bilirubin 0.4  0.3 - 1.2 mg/dL   GFR calc non Af Amer >90  >90 mL/min   GFR calc Af Amer >90  >90 mL/min  LIPASE, BLOOD      Result Value Range   Lipase 26  11 - 59 U/L  POCT PREGNANCY, URINE      Result Value Range   Preg Test, Ur NEGATIVE  NEGATIVE   Laboratory interpretation all normal except mild hypokalemia, concentrated urine c/w dehydration, mild anemia    1. Nausea   2. Dehydration   3. GERD (gastroesophageal reflux disease)     New Prescriptions   OMEPRAZOLE (PRILOSEC) 20 MG CAPSULE    Take 1 po BID x 2 weeks then once a day   ONDANSETRON (ZOFRAN ODT) 8 MG DISINTEGRATING TABLET    Take 1 tablet (8 mg total) by mouth every 8 (eight) hours as needed for nausea.     Plan discharge  Devoria Albe, MD, FACEP   MDM          Ward Givens, MD 04/15/13 [phone]

## 2013-04-15 NOTE — ED Notes (Signed)
Pt complains of nausea and dizziness x 5 days and "getting worse" Pt reports unable to eat or drink anything x 2 days.

## 2013-04-15 NOTE — Telephone Encounter (Signed)
Patient Information:  Caller Name: [place]  Phone: [phone]  Patient: Haley Harding, Haley Harding  Gender: Female  DOB: 25-Sep-1966  Age: 47 Years  PCP: Sanda Linger (Adults only)  Pregnant: No  Office Follow Up:  Does the office need to follow up with this patient?: No  Instructions For The Office: N/A  RN Note:  Instructed pt to go to [facility] ED but pt states that she is going to [employer]; another adult to drive- Note sent High Priority for Office Head's up that pt sent to ED  Symptoms  Reason For Call & Symptoms: Pt is calling and states that she has vomiting and nausea; nausea started 04/11/13; vomiting started 04/14/13; has not had anything to drink since 5:00pm on 04/13/13; last time vomited 7:00pm on 04/14/13; still can not even drink water due to nausea; did urinate this morning; urinated small amount at 5:00am; mouth dry; attempted sips of ginger ale today but states that she can not tolerate it;    Reviewed Health History In EMR: Yes  Reviewed Medications In EMR: Yes  Reviewed Allergies In EMR: Yes  Reviewed Surgeries / Procedures: Yes  Date of Onset of Symptoms: 04/11/2013 OB / GYN:  LMP: 04/01/2013  Guideline(s) Used:  Vomiting  Disposition Per Guideline:   Go to ED Now (or to Office with PCP Approval)  Reason For Disposition Reached:   Drinking very little and has signs of dehydration (e.g., no urine > 12 hours, very dry mouth, very lightheaded)  Advice Given:  Call Back If:  You have more questions  You become worse.  Patient Will Follow Care Advice:  YES

## 2013-04-15 NOTE — ED Notes (Signed)
MD at bedside. 

## 2013-05-03 ENCOUNTER — Ambulatory Visit: Payer: 59 | Admitting: Internal Medicine

## 2013-06-12 ENCOUNTER — Other Ambulatory Visit (INDEPENDENT_AMBULATORY_CARE_PROVIDER_SITE_OTHER): Payer: 59

## 2013-06-12 ENCOUNTER — Ambulatory Visit (INDEPENDENT_AMBULATORY_CARE_PROVIDER_SITE_OTHER): Payer: 59 | Admitting: Internal Medicine

## 2013-06-12 ENCOUNTER — Encounter: Payer: Self-pay | Admitting: Internal Medicine

## 2013-06-12 VITALS — BP 118/64 | HR 87 | Temp 97.7°F | Resp 10 | Ht 68.0 in | Wt 189.0 lb

## 2013-06-12 DIAGNOSIS — R609 Edema, unspecified: Secondary | ICD-10-CM

## 2013-06-12 DIAGNOSIS — E876 Hypokalemia: Secondary | ICD-10-CM

## 2013-06-12 DIAGNOSIS — I10 Essential (primary) hypertension: Secondary | ICD-10-CM

## 2013-06-12 DIAGNOSIS — D509 Iron deficiency anemia, unspecified: Secondary | ICD-10-CM

## 2013-06-12 LAB — BASIC METABOLIC PANEL
BUN: 12 mg/dL (ref 6–23)
CO2: 25 mEq/L (ref 19–32)
Chloride: 102 mEq/L (ref 96–112)
Creatinine, Ser: 0.6 mg/dL (ref 0.4–1.2)

## 2013-06-12 LAB — CBC WITH DIFFERENTIAL/PLATELET
Basophils Relative: 1.4 % (ref 0.0–3.0)
Eosinophils Absolute: 0.1 10*3/uL (ref 0.0–0.7)
Lymphs Abs: 1.6 10*3/uL (ref 0.7–4.0)
MCHC: 31.2 g/dL (ref 30.0–36.0)
MCV: 71.5 fl — ABNORMAL LOW (ref 78.0–100.0)
Monocytes Absolute: 0.5 10*3/uL (ref 0.1–1.0)
Neutro Abs: 4.5 10*3/uL (ref 1.4–7.7)
Neutrophils Relative %: 66.5 % (ref 43.0–77.0)
RBC: 3.9 Mil/uL (ref 3.87–5.11)

## 2013-06-12 LAB — SEDIMENTATION RATE: Sed Rate: 55 mm/hr — ABNORMAL HIGH (ref 0–22)

## 2013-06-12 LAB — MAGNESIUM: Magnesium: 2 mg/dL (ref 1.5–2.5)

## 2013-06-12 NOTE — Patient Instructions (Signed)
Edema  Edema is an abnormal build-up of fluids in tissues. Because this is partly dependent on gravity (water flows to the lowest place), it is more common in the legs and thighs (lower extremities). It is also common in the looser tissues, like around the eyes. Painless swelling of the feet and ankles is common and increases as a person ages. It may affect both legs and may include the calves or even thighs. When squeezed, the fluid may move out of the affected area and may leave a dent for a few moments.  CAUSES    Prolonged standing or sitting in one place for extended periods of time. Movement helps pump tissue fluid into the veins, and absence of movement prevents this, resulting in edema.   Varicose veins. The valves in the veins do not work as well as they should. This causes fluid to leak into the tissues.   Fluid and salt overload.   Injury, burn, or surgery to the leg, ankle, or foot, may damage veins and allow fluid to leak out.   Sunburn damages vessels. Leaky vessels allow fluid to go out into the sunburned tissues.   Allergies (from insect bites or stings, medications or chemicals) cause swelling by allowing vessels to become leaky.   Protein in the blood helps keep fluid in your vessels. Low protein, as in malnutrition, allows fluid to leak out.   Hormonal changes, including pregnancy and menstruation, cause fluid retention. This fluid may leak out of vessels and cause edema.   Medications that cause fluid retention. Examples are sex hormones, blood pressure medications, steroid treatment, or anti-depressants.   Some illnesses cause edema, especially heart failure, kidney disease, or liver disease.   Surgery that cuts veins or lymph nodes, such as surgery done for the heart or for breast cancer, may result in edema.  DIAGNOSIS   Your caregiver is usually easily able to determine what is causing your swelling (edema) by simply asking what is wrong (getting a history) and examining you (doing  a physical). Sometimes x-rays, EKG (electrocardiogram or heart tracing), and blood work may be done to evaluate for underlying medical illness.  TREATMENT   General treatment includes:   Leg elevation (or elevation of the affected body part).   Restriction of fluid intake.   Prevention of fluid overload.   Compression of the affected body part. Compression with elastic bandages or support stockings squeezes the tissues, preventing fluid from entering and forcing it back into the blood vessels.   Diuretics (also called water pills or fluid pills) pull fluid out of your body in the form of increased urination. These are effective in reducing the swelling, but can have side effects and must be used only under your caregiver's supervision. Diuretics are appropriate only for some types of edema.  The specific treatment can be directed at any underlying causes discovered. Heart, liver, or kidney disease should be treated appropriately.  HOME CARE INSTRUCTIONS    Elevate the legs (or affected body part) above the level of the heart, while lying down.   Avoid sitting or standing still for prolonged periods of time.   Avoid putting anything directly under the knees when lying down, and do not wear constricting clothing or garters on the upper legs.   Exercising the legs causes the fluid to work back into the veins and lymphatic channels. This may help the swelling go down.   The pressure applied by elastic bandages or support stockings can help reduce ankle swelling.     A low-salt diet may help reduce fluid retention and decrease the ankle swelling.   Take any medications exactly as prescribed.  SEEK MEDICAL CARE IF:   Your edema is not responding to recommended treatments.  SEEK IMMEDIATE MEDICAL CARE IF:    You develop shortness of breath or chest pain.   You cannot breathe when you lay down; or if, while lying down, you have to get up and go to the window to get your breath.   You are having increasing  swelling without relief from treatment.   You develop a fever over 102 F (38.9 C).   You develop pain or redness in the areas that are swollen.   Tell your caregiver right away if you have gained 3 lb/1.4 kg in 1 day or 5 lb/2.3 kg in a week.  MAKE SURE YOU:    Understand these instructions.   Will watch your condition.   Will get help right away if you are not doing well or get worse.  Document Released: 12/05/2005 Document Revised: 06/05/2012 Document Reviewed: 07/23/2008  [employer] Patient Information 2014 [employer].

## 2013-06-12 NOTE — Progress Notes (Signed)
  Subjective:    Patient ID: Haley Harding, female    DOB: 1966-03-08, 47 y.o.   MRN: [id number]  Hypertension This is a chronic problem. The current episode started more than 1 year ago. The problem is unchanged. Associated symptoms include peripheral edema. Pertinent negatives include no anxiety, blurred vision, chest pain, headaches, malaise/fatigue, neck pain, orthopnea, palpitations, PND, shortness of breath or sweats. Past treatments include beta blockers and diuretics. The current treatment provides moderate improvement. Compliance problems include exercise and diet.       Review of Systems  Constitutional: Negative.  Negative for malaise/fatigue.  HENT: Negative.  Negative for neck pain.   Eyes: Negative.  Negative for blurred vision.  Respiratory: Negative.  Negative for apnea, cough, choking, chest tightness, shortness of breath, wheezing and stridor.   Cardiovascular: Positive for leg swelling. Negative for chest pain, palpitations, orthopnea and PND.  Gastrointestinal: Negative.  Negative for nausea, vomiting, abdominal pain, diarrhea, constipation and blood in stool.  Endocrine: Negative.   Genitourinary: Negative.  Negative for dysuria, urgency and hematuria.  Musculoskeletal: Negative.   Skin: Negative.   Allergic/Immunologic: Negative.   Neurological: Negative.  Negative for dizziness, tremors, syncope, speech difficulty, weakness, light-headedness and headaches.  Hematological: Negative.  Negative for adenopathy. Does not bruise/bleed easily.  Psychiatric/Behavioral: Negative.        Objective:   Physical Exam  Vitals reviewed. Constitutional: She is oriented to person, place, and time. She appears well-developed and well-nourished. No distress.  HENT:  Head: Normocephalic and atraumatic.  Mouth/Throat: Oropharynx is clear and moist. No oropharyngeal exudate.  Eyes: Conjunctivae are normal. Right eye exhibits no discharge. Left eye exhibits no discharge. No  scleral icterus.  Neck: Normal range of motion. Neck supple. No JVD present. No tracheal deviation present. No thyromegaly present.  Cardiovascular: Normal rate, regular rhythm, normal heart sounds and intact distal pulses.  Exam reveals no gallop and no friction rub.   No murmur heard. Pulmonary/Chest: Effort normal and breath sounds normal. No stridor. No respiratory distress. She has no wheezes. She has no rales. She exhibits no tenderness.  Abdominal: Soft. Bowel sounds are normal. She exhibits no distension and no mass. There is no tenderness. There is no rebound and no guarding.  Musculoskeletal: Normal range of motion. She exhibits edema (1+ pitting edema in BLE). She exhibits no tenderness.  Lymphadenopathy:    She has no cervical adenopathy.  Neurological: She is oriented to person, place, and time.  Skin: Skin is warm and dry. No rash noted. She is not diaphoretic. No erythema. No pallor.  Psychiatric: She has a normal mood and affect. Her behavior is normal. Judgment and thought content normal.     Lab Results  Component Value Date   WBC 7.8 04/15/2013   HGB 11.0* 04/15/2013   HCT 35.7* 04/15/2013   PLT 511* 04/15/2013   GLUCOSE 73 04/15/2013   CHOL 169 07/27/2012   TRIG 28.0 07/27/2012   HDL 69.20 07/27/2012   LDLCALC 94 07/27/2012   ALT 16 04/15/2013   AST 20 04/15/2013   NA 136 04/15/2013   K 3.4* 04/15/2013   CL 100 04/15/2013   CREATININE 0.67 04/15/2013   BUN 14 04/15/2013   CO2 23 04/15/2013   TSH 0.85 07/27/2012       Assessment & Plan:

## 2013-06-13 ENCOUNTER — Encounter: Payer: Self-pay | Admitting: Internal Medicine

## 2013-06-13 NOTE — Assessment & Plan Note (Signed)
Recheck of the K+ level today is normal

## 2013-06-13 NOTE — Assessment & Plan Note (Signed)
Her BNP is slightly elevated so I have asked her to get another ECHO done and to f/up with Dr. Shirlee Latch The other labs show no causes for her to develop edema, I have asked her to stop eating sodium, elevate her legs, and stay out of the heat

## 2013-06-13 NOTE — Assessment & Plan Note (Signed)
CBC today.  

## 2013-06-13 NOTE — Assessment & Plan Note (Signed)
Her BP is well controlled 

## 2013-06-16 ENCOUNTER — Encounter (HOSPITAL_COMMUNITY): Payer: Self-pay | Admitting: Emergency Medicine

## 2013-06-16 ENCOUNTER — Emergency Department (HOSPITAL_COMMUNITY)
Admission: EM | Admit: 2013-06-16 | Discharge: 2013-06-16 | Disposition: A | Discharge status: Home / Self Care | Payer: 59 | Source: Home / Self Care | Attending: Emergency Medicine | Admitting: Emergency Medicine

## 2013-06-16 DIAGNOSIS — M545 Low back pain, unspecified: Secondary | ICD-10-CM

## 2013-06-16 DIAGNOSIS — R350 Frequency of micturition: Secondary | ICD-10-CM

## 2013-06-16 LAB — POCT I-STAT, CHEM 8
BUN: 13 mg/dL (ref 6–23)
Creatinine, Ser: 0.8 mg/dL (ref 0.50–1.10)
Potassium: 4.1 mEq/L (ref 3.5–5.1)
Sodium: 137 mEq/L (ref 135–145)
TCO2: 28 mmol/L (ref 0–100)

## 2013-06-16 LAB — POCT URINALYSIS DIP (DEVICE)
Hgb urine dipstick: NEGATIVE
Protein, ur: NEGATIVE mg/dL
Specific Gravity, Urine: 1.02 (ref 1.005–1.030)
Urobilinogen, UA: 0.2 mg/dL (ref 0.0–1.0)
pH: 7.5 (ref 5.0–8.0)

## 2013-06-16 MED ORDER — NAPROXEN 500 MG PO TABS: 500.0000 mg | ORAL_TABLET | Freq: Two times a day (BID) | ORAL | Status: DC | PRN

## 2013-06-16 NOTE — ED Notes (Signed)
Pt reports lower back pain and urinary frequency.  Symptoms present since Friday. Pt has not tried any otc meds for symptoms.  Denies pelvic or abdominal pain. Pt is alert and oriented.

## 2013-06-16 NOTE — ED Provider Notes (Signed)
History    CSN: [id number] Arrival date & time 06/16/13  1137  First MD Initiated Contact with Patient 06/16/13 1308     Chief Complaint  Patient presents with  . Urinary Tract Infection    lower back pain frequency   (Consider location/radiation/quality/duration/timing/severity/associated sxs/prior Treatment) HPI Comments: 47 year old female presents complaining of 3 days of urinary frequency and lower back pain. She thinks she may have a bladder infection. She also has some mild lower abdominal pain as well as polydipsia. She denies any fever, dysuria, NVD, vaginal irritation or discharge.  Patient is a 47 y.o. female presenting with urinary tract infection.  Urinary Tract Infection Pertinent negatives include no chest pain, no abdominal pain and no shortness of breath.   Past Medical History  Diagnosis Date  . Depression   . Herpes genitalia   . Anemia   . Hypertension   . Uterine cyst    Past Surgical History  Procedure Laterality Date  . None    . Tubal ligation     Family History  Problem Relation Age of Onset  . Hypertension Mother   . Hypertension Father   . Diabetes Father    History  Substance Use Topics  . Smoking status: Never Smoker   . Smokeless tobacco: Never Used  . Alcohol Use: No   OB History   Grav Para Term Preterm Abortions TAB SAB Ect Mult Living                 Review of Systems  Constitutional: Negative for fever and chills.  Eyes: Negative for visual disturbance.  Respiratory: Negative for cough and shortness of breath.   Cardiovascular: Negative for chest pain, palpitations and leg swelling.  Gastrointestinal: Negative for nausea, vomiting and abdominal pain.  Endocrine: Positive for polydipsia and polyuria.  Genitourinary: Positive for frequency. Negative for dysuria, urgency, hematuria, flank pain, vaginal discharge, genital sores, vaginal pain, menstrual problem and pelvic pain.  Musculoskeletal: Positive for back pain. Negative  for myalgias and arthralgias.  Skin: Negative for rash.  Neurological: Negative for dizziness, weakness and light-headedness.    Allergies  Review of patient's allergies indicates no known allergies.  Home Medications   Current Outpatient Rx  Name  Route  Sig  Dispense  Refill  . ferrous sulfate (FEOSOL) 325 (65 FE) MG tablet   Oral   Take 1 tablet (325 mg total) by mouth 3 (three) times daily with meals.   90 tablet   11   . Metoprolol-Hydrochlorothiazide (DUTOPROL) 50-12.5 MG TB24   Oral   Take 1 tablet by mouth daily.   90 tablet   3   . omeprazole (PRILOSEC) 20 MG capsule      Take 1 po BID x 2 weeks then once a day   60 capsule   0   . acyclovir (ZOVIRAX) 800 MG tablet   Oral   Take 1 tablet (800 mg total) by mouth 3 (three) times daily.   30 tablet   5   . DULoxetine (CYMBALTA) 60 MG capsule   Oral   Take 1 capsule (60 mg total) by mouth daily.   90 capsule   3   . naproxen (NAPROSYN) 500 MG tablet   Oral   Take 1 tablet (500 mg total) by mouth 2 (two) times daily as needed.   60 tablet   0   . ondansetron (ZOFRAN ODT) 8 MG disintegrating tablet   Oral   Take 1 tablet (8 mg total) by  mouth every 8 (eight) hours as needed for nausea.   10 tablet   0    BP 145/91  Pulse 70  Temp(Src) 98.3 F (36.8 C) (Oral)  Resp 16  SpO2 100%  LMP 06/02/2013 Physical Exam  Nursing note and vitals reviewed. Constitutional: She is oriented to person, place, and time. Vital signs are normal. She appears well-developed and well-nourished. No distress.  HENT:  Head: Atraumatic.  Eyes: EOM are normal. Pupils are equal, round, and reactive to light.  Cardiovascular: Normal rate, regular rhythm and normal heart sounds.  Exam reveals no gallop and no friction rub.   No murmur heard. Pulmonary/Chest: Effort normal and breath sounds normal. No respiratory distress. She has no wheezes. She has no rales.  Abdominal: Soft. There is no hepatosplenomegaly. There is  tenderness in the right lower quadrant, suprapubic area and left lower quadrant. There is no rigidity, no rebound, no guarding, no CVA tenderness, no tenderness at McBurney's point and negative Murphy's sign.  Tenderness is mild  Neurological: She is alert and oriented to person, place, and time. She has normal strength.  Skin: Skin is warm and dry. She is not diaphoretic.  Psychiatric: She has a normal mood and affect. Her behavior is normal. Judgment normal.    ED Course  Procedures (including critical care time) Labs Reviewed  POCT I-STAT, CHEM 8 - Abnormal; Notable for the following:    Hemoglobin 11.2 (*)    HCT 33.0 (*)    All other components within normal limits  URINE CULTURE  POCT URINALYSIS DIP (DEVICE)  POCT PREGNANCY, URINE   No results found. 1. Low back pain   2. Urinary frequency     MDM  Lips appear to be any urinary tract infection. I-STAT chem 8 is normal as well. I suspect this in his lower back pain along with increased urination to 2 increased fluid intake. Will discharge with anti-inflammatories for the lower back pain and she will follow this does not improve.   Meds ordered this encounter  Medications  . naproxen (NAPROSYN) 500 MG tablet    Sig: Take 1 tablet (500 mg total) by mouth 2 (two) times daily as needed.    Dispense:  60 tablet    Refill:  0     Graylon Good, PA-C 06/16/13 1406

## 2013-06-17 NOTE — ED Provider Notes (Signed)
Medical screening examination/treatment/procedure(s) were performed by non-physician practitioner and as supervising physician I was immediately available for consultation/collaboration.  Leslee Home, M.D.  Reuben Likes, MD 06/17/13 2154

## 2013-06-18 LAB — URINE CULTURE: Colony Count: >=100000

## 2013-06-25 ENCOUNTER — Other Ambulatory Visit (HOSPITAL_COMMUNITY): Payer: 59

## 2013-07-04 ENCOUNTER — Encounter: Payer: Self-pay | Admitting: Internal Medicine

## 2013-07-04 ENCOUNTER — Ambulatory Visit (HOSPITAL_COMMUNITY): Payer: 59 | Attending: Internal Medicine | Admitting: Radiology

## 2013-07-04 ENCOUNTER — Ambulatory Visit (INDEPENDENT_AMBULATORY_CARE_PROVIDER_SITE_OTHER): Payer: 59 | Admitting: Internal Medicine

## 2013-07-04 VITALS — BP 124/82 | HR 76 | Temp 98.1°F | Resp 16 | Wt 188.0 lb

## 2013-07-04 DIAGNOSIS — I10 Essential (primary) hypertension: Secondary | ICD-10-CM

## 2013-07-04 DIAGNOSIS — R609 Edema, unspecified: Secondary | ICD-10-CM

## 2013-07-04 DIAGNOSIS — M7989 Other specified soft tissue disorders: Secondary | ICD-10-CM | POA: Insufficient documentation

## 2013-07-04 DIAGNOSIS — D509 Iron deficiency anemia, unspecified: Secondary | ICD-10-CM

## 2013-07-04 NOTE — Progress Notes (Signed)
  Subjective:    Patient ID: Haley Harding, female    DOB: February 15, 1966, 47 y.o.   MRN: [id number]  Hypertension This is a chronic problem. The current episode started more than 1 year ago. The problem has been gradually improving since onset. The problem is controlled. Pertinent negatives include no anxiety, blurred vision, chest pain, headaches, malaise/fatigue, neck pain, orthopnea, palpitations, peripheral edema, PND, shortness of breath or sweats. Past treatments include beta blockers and diuretics. The current treatment provides significant improvement. There are no compliance problems.       Review of Systems  Constitutional: Negative.  Negative for malaise/fatigue.  HENT: Negative.  Negative for neck pain.   Eyes: Negative.  Negative for blurred vision.  Respiratory: Negative for choking, shortness of breath, wheezing and stridor.   Cardiovascular: Negative.  Negative for chest pain, palpitations, orthopnea, leg swelling and PND.  Gastrointestinal: Negative.  Negative for nausea, vomiting, diarrhea, constipation and anal bleeding.  Endocrine: Negative.   Genitourinary: Positive for menstrual problem (heavy periods). Negative for dysuria, frequency and hematuria.  Musculoskeletal: Negative.   Skin: Negative.   Allergic/Immunologic: Negative.   Neurological: Negative for dizziness, weakness, light-headedness and headaches.  Hematological: Negative.  Negative for adenopathy. Does not bruise/bleed easily.  Psychiatric/Behavioral: Negative.        Objective:   Physical Exam  Vitals reviewed. Constitutional: She is oriented to person, place, and time. She appears well-developed and well-nourished. No distress.  HENT:  Head: Normocephalic and atraumatic.  Mouth/Throat: Oropharynx is clear and moist. No oropharyngeal exudate.  Eyes: Conjunctivae are normal. Right eye exhibits no discharge. Left eye exhibits no discharge. No scleral icterus.  Neck: Normal range of motion. Neck  supple. No JVD present. No tracheal deviation present. No thyromegaly present.  Cardiovascular: Normal rate, regular rhythm, normal heart sounds and intact distal pulses.  Exam reveals no gallop and no friction rub.   No murmur heard. Pulmonary/Chest: Effort normal and breath sounds normal. No stridor. No respiratory distress. She has no wheezes. She has no rales. She exhibits no tenderness.  Abdominal: Soft. Bowel sounds are normal. She exhibits no distension and no mass. There is no tenderness. There is no rebound and no guarding.  Musculoskeletal: Normal range of motion. She exhibits edema (trace edema in BLE). She exhibits no tenderness.  Lymphadenopathy:    She has no cervical adenopathy.  Neurological: She is oriented to person, place, and time.  Skin: Skin is warm and dry. No rash noted. She is not diaphoretic. No erythema. No pallor.  Psychiatric: She has a normal mood and affect. Her behavior is normal. Judgment and thought content normal.     Lab Results  Component Value Date   WBC 6.8 06/12/2013   HGB 11.2* 06/16/2013   HCT 33.0* 06/16/2013   PLT 543.0* 06/12/2013   GLUCOSE 90 06/16/2013   CHOL 169 07/27/2012   TRIG 28.0 07/27/2012   HDL 69.20 07/27/2012   LDLCALC 94 07/27/2012   ALT 16 04/15/2013   AST 20 04/15/2013   NA 137 06/16/2013   K 4.1 06/16/2013   CL 99 06/16/2013   CREATININE 0.80 06/16/2013   BUN 13 06/16/2013   CO2 25 06/12/2013   TSH 0.75 06/12/2013       Assessment & Plan:

## 2013-07-04 NOTE — Progress Notes (Signed)
Echocardiogram performed.  

## 2013-07-04 NOTE — Assessment & Plan Note (Signed)
She will see her GYN about the blood loss during her menstrual cycle She agrees to be compliant with the iron therapy

## 2013-07-04 NOTE — Assessment & Plan Note (Signed)
Her BP is well controlled 

## 2013-07-04 NOTE — Patient Instructions (Addendum)
Anemia, Nonspecific  Your exam and blood tests show you are anemic. This means your blood (hemoglobin) level is low. Normal hemoglobin values are 12 to 15 g/dL for females and 14 to 17 g/dL for males. Make a note of your hemoglobin level today. The hematocrit percent is also used to measure anemia. A normal hematocrit is 38% to 46% in females and 42% to 49% in males. Make a note of your hematocrit level today.  CAUSES   Anemia can be due to many different causes.   Excessive bleeding from periods (in women).   Intestinal bleeding.   Poor nutrition.   Kidney, thyroid, liver, and bone marrow diseases.  SYMPTOMS   Anemia can come on suddenly (acute). It can also come on slowly. Symptoms can include:   Minor weakness.   Dizziness.   Palpitations.   Shortness of breath.  Symptoms may be absent until half your hemoglobin is missing if it comes on slowly. Anemia due to acute blood loss from an injury or internal bleeding may require blood transfusion if the loss is severe. Hospital care is needed if you are anemic and there is significant continual blood loss.  TREATMENT    Stool tests for blood (Hemoccult) and additional lab tests are often needed. This determines the best treatment.   Further checking on your condition and your response to treatment is very important. It often takes many weeks to correct anemia.  Depending on the cause, treatment can include:   Supplements of iron.   Vitamins B12 and folic acid.   Hormone medicines.  If your anemia is due to bleeding, finding the cause of the blood loss is very important. This will help avoid further problems.  SEEK IMMEDIATE MEDICAL CARE IF:    You develop fainting, extreme weakness, shortness of breath, or chest pain.   You develop heavy vaginal bleeding.   You develop bloody or black, tarry stools or vomit up blood.   You develop a high fever, rash, repeated vomiting, or dehydration.  Document Released: 01/12/2005 Document Revised: 02/27/2012 Document  Reviewed: 10/20/2009  [employer] Patient Information 2014 [employer].

## 2013-09-03 ENCOUNTER — Encounter: Payer: Self-pay | Admitting: Internal Medicine

## 2013-10-07 ENCOUNTER — Ambulatory Visit (INDEPENDENT_AMBULATORY_CARE_PROVIDER_SITE_OTHER): Payer: 59 | Admitting: Internal Medicine

## 2013-10-07 ENCOUNTER — Other Ambulatory Visit (INDEPENDENT_AMBULATORY_CARE_PROVIDER_SITE_OTHER): Payer: 59

## 2013-10-07 ENCOUNTER — Encounter: Payer: Self-pay | Admitting: Internal Medicine

## 2013-10-07 VITALS — BP 128/82 | HR 100 | Ht 68.0 in | Wt 196.0 lb

## 2013-10-07 DIAGNOSIS — D500 Iron deficiency anemia secondary to blood loss (chronic): Secondary | ICD-10-CM

## 2013-10-07 DIAGNOSIS — N920 Excessive and frequent menstruation with regular cycle: Secondary | ICD-10-CM

## 2013-10-07 DIAGNOSIS — N858 Other specified noninflammatory disorders of uterus: Secondary | ICD-10-CM

## 2013-10-07 DIAGNOSIS — R141 Gas pain: Secondary | ICD-10-CM

## 2013-10-07 DIAGNOSIS — R140 Abdominal distension (gaseous): Secondary | ICD-10-CM

## 2013-10-07 DIAGNOSIS — K3189 Other diseases of stomach and duodenum: Secondary | ICD-10-CM

## 2013-10-07 DIAGNOSIS — R1013 Epigastric pain: Secondary | ICD-10-CM | POA: Insufficient documentation

## 2013-10-07 DIAGNOSIS — N9489 Other specified conditions associated with female genital organs and menstrual cycle: Secondary | ICD-10-CM

## 2013-10-07 HISTORY — DX: Other specified noninflammatory disorders of uterus: N85.8

## 2013-10-07 LAB — CBC WITH DIFFERENTIAL/PLATELET
Basophils Relative: 0.5 % (ref 0.0–3.0)
Eosinophils Absolute: 0.1 10*3/uL (ref 0.0–0.7)
Hemoglobin: 11.6 g/dL — ABNORMAL LOW (ref 12.0–15.0)
MCHC: 32.8 g/dL (ref 30.0–36.0)
MCV: 82.7 fl (ref 78.0–100.0)
Monocytes Absolute: 0.6 10*3/uL (ref 0.1–1.0)
Neutro Abs: 7.1 10*3/uL (ref 1.4–7.7)
RBC: 4.29 Mil/uL (ref 3.87–5.11)

## 2013-10-07 MED ORDER — PANTOPRAZOLE SODIUM 40 MG PO TBEC: 40.0000 mg | DELAYED_RELEASE_TABLET | Freq: Every day | ORAL | Status: DC

## 2013-10-07 MED ORDER — DICYCLOMINE HCL 20 MG PO TABS: 20.0000 mg | ORAL_TABLET | Freq: Three times a day (TID) | ORAL | Status: DC

## 2013-10-07 NOTE — Assessment & Plan Note (Signed)
Hysterectomy has been recommended by her gynecologist, seems appropriate to me. I encouraged her to followup with Dr. Tenny Craw and discuss this again.

## 2013-10-07 NOTE — Assessment & Plan Note (Signed)
It sounds like she has a post viral or bacterial gastroenteritis problem with mostly gastric ulcer minus small bowel motility disturbance. Daily PPI and dicyclomine prior to meals will be given. She will be reassessed in 6-8 weeks. She could need something like buspirone perhaps, if she has persistent problems despite a PPI then an upper GI endoscopy could be reasonable.

## 2013-10-07 NOTE — Progress Notes (Signed)
Subjective:    Patient ID: Haley Harding, female    DOB: 1966/12/01, 47 y.o.   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 She was well until about 6 months ago, a chicken meal was eating at her place of work and she and another coworker became sick. The patient had several days of nausea vomiting, abdominal cramps and diarrhea. She went to the emergency department or urgent care was evaluated and told she had a gastritis. A PPI was recommended. She improved but has had persistent postprandial bloating and abdominal distention. She is actually gaining weight. She belches and passes a lot of flatus as well. There is no significant change in bowel habits. No rectal bleeding melena or hematochezia. She has a long-standing iron deficiency anemia as well but has menorrhagia with having menstrual periods 2 weeks at a month. She is intolerant of oral iron saying upsets her stomach and plus she seems to forget to take it. Her last hemoglobin was 8 3 months ago. She is weak and tired most of the time. A hysterectomy has been recommended by her gynecologist but she is reluctant to do so because she is afraid of having to take hormone therapy. No Known Allergies Outpatient Prescriptions Prior to Visit  Medication Sig Dispense Refill  . acyclovir (ZOVIRAX) 800 MG tablet Take 1 tablet (800 mg total) by mouth 3 (three) times daily.  30 tablet  5  . DULoxetine (CYMBALTA) 60 MG capsule Take 1 capsule (60 mg total) by mouth daily.  90 capsule  3  . Metoprolol-Hydrochlorothiazide (DUTOPROL) 50-12.5 MG TB24 Take 1 tablet by mouth daily.  90 tablet  3  . omeprazole (PRILOSEC) 20 MG capsule Take 20 mg by mouth daily as needed.      . ferrous sulfate (FEOSOL) 325 (65 FE) MG tablet Take 1 tablet (325 mg total) by mouth 3 (three) times daily with meals.  90 tablet  11   No facility-administered medications prior to visit.    Past Medical History  Diagnosis Date  . Depression   . Herpes genitalia   . Anemia   . Hypertension   . Uterine cyst   . Menorrhagia   . Iron deficiency anemia due to chronic blood loss   . Mass of uterus 10/07/2013   Past Surgical History  Procedure Laterality Date  . Tubal ligation     History   Social History  . Marital Status: Divorced    Spouse Name: N/A    Number of Children: 3  .     Occupational History  . CLINICAL ASSISTANT    Social History Main Topics  . Smoking status: Never Smoker   . Smokeless tobacco: Never Used  . Alcohol Use: No  . Drug Use: No  . Sexual Activity: Not Currently          Social History Narrative   Divorced, one son to daughters   Sleep lab coordinator [facility]   One caffeinated beverage daily   10/07/2013         Family History  Problem Relation Age of Onset  . Hypertension Mother   . Hypertension Father   . Diabetes Father   . Cancer Neg Hx   . Alcohol abuse Neg Hx   . Early death Neg Hx   . Hearing loss Neg Hx   . Heart disease Neg Hx   . Hyperlipidemia Neg  Hx   . Kidney disease Neg Hx   . Stroke Neg Hx    Review of Systems As per history of present illness. She also has some depressive symptoms and menstrual pain and stress urinary incontinence at times. All other review of systems are negative.    Objective:   Physical Exam General:  Well-developed, well-nourished and in no acute distress Eyes:  Anicteric.PALE CONJUCTIVA ENT:   Mouth and posterior pharynx free of lesions PALE MUCOSA Neck:   supple w/o thyromegaly or mass.  Lungs: Clear to auscultation bilaterally. Heart:  S1S2, no rubs, murmurs, gallops. Abdomen:  soft, mildly tender epigastrium, no hepatosplenomegaly, hernia, or mass and BS+.  Lymph:  no cervical or supraclavicular adenopathy. Extremities:   no edema Skin   no rash. Neuro:  A&O x 3.  Psych:  appropriate mood and  Affect.   Data Reviewed: 12/31/2012 CT abdomen  and pelvis Endometrial fluid with a 4.5 x 3.7 x 3.8 cm diameter central  uterine mass question submucosal leiomyoma versus endometrial  polyp; this can be further evaluated by follow-up pelvic and  transvaginal sonography. 03/14/2013 pathology 1. Endocervix, curettage - DETACHED BENIGN ENDOCERVICAL MUCOSAL FRAGMENTS. - DETACHED BENIGN SQUAMOUS MUCOSAL FRAGMENTS. - FRAGMENTS OF BENIGN ENDOCERVICAL-TYPE POLYP - NO DYSPLASIA, ATYPIA OR MALIGNANCY IDENTIFIED. - SEE COMMENT. 2. Cervix, biopsy - BENIGN SQUAMOUS MUCOSA. - BENIGN ENDOCERVICAL MUCOSA. - FRAGMENTS OF BENIGN ENDOCERVICAL-TYPE POLYP. - NO DYSPLASIA, ATYPIA Lab Results  Component Value Date   WBC 9.8 10/07/2013   HGB 11.6* 10/07/2013   HCT 35.5* 10/07/2013   MCV 82.7 10/07/2013   PLT 577.0* 10/07/2013   Lab Results  Component Value Date   FERRITIN 2.7* 07/27/2012       Assessment & Plan:   1. Dyspepsia   2. Bloating   3. Iron deficiency anemia secondary to blood loss (chronic)    4. Menorrhagia   5. Mass of uterus       Please see the problem oriented charting. In short I will set her up for ferraheme and treat with PPI and dicyclomine. I encouraged her to go back to Dr. Tenny Craw about hysterectomy.   Cc: Dr. Tenny Craw, GYN

## 2013-10-07 NOTE — Assessment & Plan Note (Signed)
This certainly sounds like it's due to menorrhagia. I encouraged her to proceed with a hysterectomy to treat this definitively. She was at least mildly intolerant of oral iron. I will schedule her for Ferraheme infusions. Recheck CBC today.

## 2013-10-07 NOTE — Assessment & Plan Note (Signed)
I believe this is the cause of her iron deficiency anemia. A screening colonoscopy is reasonable at her age and because she is [place], we have decided to wait until followup to discuss that further. Hysterectomy would be prudent it seems.

## 2013-10-07 NOTE — Patient Instructions (Addendum)
We have sent the following medications to your pharmacy for you to pick up at your convenience: pantoprazole and dicyclomine  Your physician has requested that you go to the basement for the following lab work before leaving today: CBC/diff  Follow up with [place] in 6 weeks.  It is the time of year to have a vaccination to prevent the flu (influenza virus).  Please have this done through your primary care provider or you can get this done at local pharmacies or the [facility]. It would be very helpful if you notify your primary care provider when and where you had the vaccination given by messaging them in My Chart, leaving a message or faxing the information.   You have been set up for an iron infusion at [facility] short stay 10/09/13 at 3:00pm , arrive at 2:45pm.  Your second dose is 10/16/13 at 3:00pm, arrive at 2:45pm.  I appreciate the opportunity to care for you.

## 2013-10-08 NOTE — Progress Notes (Signed)
Quick Note:  Hgb only mildly low - actually same as it was recently but I did not see that one, saw one before that was 8  Still makes to replete iron with Feraheme as planned ______

## 2013-10-09 ENCOUNTER — Ambulatory Visit (HOSPITAL_COMMUNITY)
Admission: RE | Admit: 2013-10-09 | Discharge: 2013-10-09 | Disposition: A | Discharge status: Home / Self Care | Payer: 59 | Source: Ambulatory Visit | Attending: Internal Medicine | Admitting: Internal Medicine

## 2013-10-09 ENCOUNTER — Encounter (HOSPITAL_COMMUNITY): Payer: Self-pay

## 2013-10-09 ENCOUNTER — Other Ambulatory Visit (HOSPITAL_COMMUNITY): Payer: Self-pay | Admitting: Internal Medicine

## 2013-10-09 VITALS — BP 122/77 | HR 74 | Temp 97.9°F | Resp 18 | Ht 68.0 in | Wt 195.0 lb

## 2013-10-09 DIAGNOSIS — D649 Anemia, unspecified: Secondary | ICD-10-CM | POA: Insufficient documentation

## 2013-10-09 DIAGNOSIS — D500 Iron deficiency anemia secondary to blood loss (chronic): Secondary | ICD-10-CM

## 2013-10-09 MED ORDER — FERUMOXYTOL INJECTION 510 MG/17 ML
510.0000 mg | Freq: Once | INTRAVENOUS | Status: AC
  Administered 2013-10-09: 510 mg via INTRAVENOUS
  Filled 2013-10-09: qty 17

## 2013-10-09 MED ORDER — SODIUM CHLORIDE 0.9 % IV SOLN
Freq: Once | INTRAVENOUS | Status: AC
  Administered 2013-10-09: 20 mL/h via INTRAVENOUS

## 2013-10-16 ENCOUNTER — Encounter (HOSPITAL_COMMUNITY): Payer: Self-pay

## 2013-10-16 ENCOUNTER — Encounter (HOSPITAL_COMMUNITY)
Admission: RE | Admit: 2013-10-16 | Discharge: 2013-10-16 | Disposition: A | Discharge status: Home / Self Care | Payer: 59 | Source: Ambulatory Visit | Attending: Internal Medicine | Admitting: Internal Medicine

## 2013-10-16 VITALS — BP 128/80 | HR 85 | Temp 97.0°F | Resp 17

## 2013-10-16 DIAGNOSIS — D649 Anemia, unspecified: Secondary | ICD-10-CM | POA: Insufficient documentation

## 2013-10-16 DIAGNOSIS — D500 Iron deficiency anemia secondary to blood loss (chronic): Secondary | ICD-10-CM

## 2013-10-16 MED ORDER — FERUMOXYTOL INJECTION 510 MG/17 ML
510.0000 mg | Freq: Once | INTRAVENOUS | Status: AC
  Administered 2013-10-16: 510 mg via INTRAVENOUS
  Filled 2013-10-16: qty 17

## 2013-10-16 MED ORDER — SODIUM CHLORIDE 0.9 % IV SOLN
Freq: Once | INTRAVENOUS | Status: AC
  Administered 2013-10-16: 15:00:00 via INTRAVENOUS

## 2014-01-22 ENCOUNTER — Emergency Department (HOSPITAL_COMMUNITY)
Admission: EM | Admit: 2014-01-22 | Discharge: 2014-01-23 | Disposition: A | Discharge status: Home / Self Care | Payer: 59 | Attending: Emergency Medicine | Admitting: Emergency Medicine

## 2014-01-22 ENCOUNTER — Ambulatory Visit (INDEPENDENT_AMBULATORY_CARE_PROVIDER_SITE_OTHER): Payer: 59 | Admitting: Gastroenterology

## 2014-01-22 ENCOUNTER — Encounter (HOSPITAL_COMMUNITY): Payer: Self-pay | Admitting: Emergency Medicine

## 2014-01-22 ENCOUNTER — Emergency Department (HOSPITAL_COMMUNITY): Payer: 59

## 2014-01-22 ENCOUNTER — Other Ambulatory Visit (INDEPENDENT_AMBULATORY_CARE_PROVIDER_SITE_OTHER): Payer: 59

## 2014-01-22 ENCOUNTER — Encounter: Payer: Self-pay | Admitting: Gastroenterology

## 2014-01-22 VITALS — BP 124/72 | HR 60 | Ht 68.0 in | Wt 192.2 lb

## 2014-01-22 DIAGNOSIS — R1013 Epigastric pain: Secondary | ICD-10-CM | POA: Insufficient documentation

## 2014-01-22 DIAGNOSIS — A6000 Herpesviral infection of urogenital system, unspecified: Secondary | ICD-10-CM | POA: Insufficient documentation

## 2014-01-22 DIAGNOSIS — D649 Anemia, unspecified: Secondary | ICD-10-CM | POA: Insufficient documentation

## 2014-01-22 DIAGNOSIS — R112 Nausea with vomiting, unspecified: Secondary | ICD-10-CM

## 2014-01-22 DIAGNOSIS — F3289 Other specified depressive episodes: Secondary | ICD-10-CM | POA: Insufficient documentation

## 2014-01-22 DIAGNOSIS — I10 Essential (primary) hypertension: Secondary | ICD-10-CM | POA: Insufficient documentation

## 2014-01-22 DIAGNOSIS — Z3202 Encounter for pregnancy test, result negative: Secondary | ICD-10-CM | POA: Insufficient documentation

## 2014-01-22 DIAGNOSIS — Z79899 Other long term (current) drug therapy: Secondary | ICD-10-CM | POA: Insufficient documentation

## 2014-01-22 DIAGNOSIS — D259 Leiomyoma of uterus, unspecified: Secondary | ICD-10-CM

## 2014-01-22 DIAGNOSIS — F329 Major depressive disorder, single episode, unspecified: Secondary | ICD-10-CM | POA: Insufficient documentation

## 2014-01-22 DIAGNOSIS — Z8742 Personal history of other diseases of the female genital tract: Secondary | ICD-10-CM | POA: Insufficient documentation

## 2014-01-22 LAB — COMPREHENSIVE METABOLIC PANEL
ALBUMIN: 3.9 g/dL (ref 3.5–5.2)
ALK PHOS: 87 U/L (ref 39–117)
ALT: 9 U/L (ref 0–35)
AST: 13 U/L (ref 0–37)
BUN: 8 mg/dL (ref 6–23)
CHLORIDE: 101 meq/L (ref 96–112)
CO2: 25 mEq/L (ref 19–32)
Calcium: 8.9 mg/dL (ref 8.4–10.5)
Creatinine, Ser: 0.67 mg/dL (ref 0.50–1.10)
GFR calc Af Amer: >90 mL/min (ref >90)
GFR calc non Af Amer: >90 mL/min (ref >90)
Glucose, Bld: 88 mg/dL (ref 70–99)
POTASSIUM: 3.7 meq/L (ref 3.7–5.3)
SODIUM: 139 meq/L (ref 137–147)
TOTAL PROTEIN: 7.5 g/dL (ref 6.0–8.3)
Total Bilirubin: 0.3 mg/dL (ref 0.3–1.2)

## 2014-01-22 LAB — CBC WITH DIFFERENTIAL/PLATELET
BASOS PCT: 1.1 % (ref 0.0–3.0)
BASOS PCT: 1 % (ref 0–1)
Basophils Absolute: 0.1 10*3/uL (ref 0.0–0.1)
Basophils Absolute: 0.1 10*3/uL (ref 0.0–0.1)
EOS ABS: 0.1 10*3/uL (ref 0.0–0.7)
EOS PCT: 1.3 % (ref 0.0–5.0)
Eosinophils Absolute: 0.1 10*3/uL (ref 0.0–0.7)
Eosinophils Relative: 1 % (ref 0–5)
HCT: 36.2 % (ref 36.0–46.0)
HEMATOCRIT: 41.1 % (ref 36.0–46.0)
HEMOGLOBIN: 13.3 g/dL (ref 12.0–15.0)
Hemoglobin: 11.8 g/dL — ABNORMAL LOW (ref 12.0–15.0)
LYMPHS ABS: 2 10*3/uL (ref 0.7–4.0)
Lymphocytes Relative: 29.7 % (ref 12.0–46.0)
Lymphocytes Relative: 29 % (ref 12–46)
Lymphs Abs: 1.9 10*3/uL (ref 0.7–4.0)
MCH: 28.2 pg (ref 26.0–34.0)
MCHC: 32.4 g/dL (ref 30.0–36.0)
MCHC: 32.6 g/dL (ref 30.0–36.0)
MCV: 90.3 fl (ref 78.0–100.0)
MCV: 86.4 fL (ref 78.0–100.0)
MONO ABS: 0.5 10*3/uL (ref 0.1–1.0)
MONOS PCT: 7.8 % (ref 3.0–12.0)
Monocytes Absolute: 0.5 10*3/uL (ref 0.1–1.0)
Monocytes Relative: 7 % (ref 3–12)
NEUTROS ABS: 4.1 10*3/uL (ref 1.4–7.7)
NEUTROS ABS: 4.2 10*3/uL (ref 1.7–7.7)
NEUTROS PCT: 62 % (ref 43–77)
Neutrophils Relative %: 60.1 % (ref 43.0–77.0)
PLATELETS: 561 10*3/uL — AB (ref 150.0–400.0)
PLATELETS: 454 10*3/uL — AB (ref 150–400)
RBC: 4.55 Mil/uL (ref 3.87–5.11)
RBC: 4.19 MIL/uL (ref 3.87–5.11)
RDW: 13.8 % (ref 11.5–14.6)
RDW: 13.3 % (ref 11.5–15.5)
WBC: 6.8 10*3/uL (ref 4.5–10.5)
WBC: 6.7 10*3/uL (ref 4.0–10.5)

## 2014-01-22 LAB — COMPREHENSIVE METABOLIC PANEL WITH GFR
ALT: 12 U/L (ref 0–35)
AST: 15 U/L (ref 0–37)
Albumin: 4.2 g/dL (ref 3.5–5.2)
Alkaline Phosphatase: 80 U/L (ref 39–117)
BUN: 8 mg/dL (ref 6–23)
CO2: 27 meq/L (ref 19–32)
Calcium: 9.4 mg/dL (ref 8.4–10.5)
Chloride: 104 meq/L (ref 96–112)
Creatinine, Ser: 0.7 mg/dL (ref 0.4–1.2)
GFR: 125.96 mL/min
Glucose, Bld: 88 mg/dL (ref 70–99)
Potassium: 4 meq/L (ref 3.5–5.1)
Sodium: 139 meq/L (ref 135–145)
Total Bilirubin: 0.6 mg/dL (ref 0.3–1.2)
Total Protein: 7.9 g/dL (ref 6.0–8.3)

## 2014-01-22 LAB — POCT I-STAT TROPONIN I: Troponin i, poc: 0 ng/mL (ref 0.00–0.08)

## 2014-01-22 LAB — URINALYSIS, ROUTINE W REFLEX MICROSCOPIC
BILIRUBIN URINE: NEGATIVE
GLUCOSE, UA: NEGATIVE mg/dL
HGB URINE DIPSTICK: NEGATIVE
Ketones, ur: 40 mg/dL — AB
Leukocytes, UA: NEGATIVE
NITRITE: NEGATIVE
PH: 5.5 (ref 5.0–8.0)
Protein, ur: NEGATIVE mg/dL
SPECIFIC GRAVITY, URINE: 1.017 (ref 1.005–1.030)
Urobilinogen, UA: 0.2 mg/dL (ref 0.0–1.0)

## 2014-01-22 LAB — AMYLASE: AMYLASE: 89 U/L (ref 27–131)

## 2014-01-22 LAB — LIPASE, BLOOD: Lipase: 14 U/L (ref 11–59)

## 2014-01-22 LAB — LIPASE: LIPASE: 13 U/L (ref 11.0–59.0)

## 2014-01-22 LAB — PREGNANCY, URINE: Preg Test, Ur: NEGATIVE

## 2014-01-22 MED ORDER — ONDANSETRON HCL 4 MG/2ML IJ SOLN
4.0000 mg | Freq: Once | INTRAMUSCULAR | Status: AC
  Administered 2014-01-22: 4 mg via INTRAVENOUS
  Filled 2014-01-22: qty 2

## 2014-01-22 MED ORDER — HYDROMORPHONE HCL PF 1 MG/ML IJ SOLN
1.0000 mg | Freq: Once | INTRAMUSCULAR | Status: AC
Start: 2014-01-22 — End: 2014-01-22
  Administered 2014-01-22: 1 mg via INTRAVENOUS
  Filled 2014-01-22: qty 1

## 2014-01-22 MED ORDER — SODIUM CHLORIDE 0.9 % IV SOLN
Freq: Once | INTRAVENOUS | Status: AC
  Administered 2014-01-22: via INTRAVENOUS

## 2014-01-22 MED ORDER — SODIUM CHLORIDE 0.9 % IV BOLUS (SEPSIS)
1000.0000 mL | Freq: Once | INTRAVENOUS | Status: AC
  Administered 2014-01-22: 1000 mL via INTRAVENOUS

## 2014-01-22 MED ORDER — HYDROMORPHONE HCL PF 1 MG/ML IJ SOLN
1.0000 mg | Freq: Once | INTRAMUSCULAR | Status: AC
  Administered 2014-01-22: 1 mg via INTRAVENOUS
  Filled 2014-01-22: qty 1

## 2014-01-22 MED ORDER — IOHEXOL 300 MG/ML  SOLN
50.0000 mL | Freq: Once | INTRAMUSCULAR | Status: AC | PRN
  Administered 2014-01-22: 50 mL via ORAL

## 2014-01-22 NOTE — ED Notes (Signed)
Pt was seen at GI doctor today, blood work is not back yet, possible suspected pancreatitis. Pt reports not being able to eat or drink x3 days. Nausea and vomiting x3 days. Abdominal pain 5/10.

## 2014-01-22 NOTE — ED Notes (Signed)
Attempted lab draw but unsuccessful. 

## 2014-01-22 NOTE — Progress Notes (Signed)
01/22/2014 Haley Harding [id number] January 03, 1966   History of Present Illness:  This is a 48 year old female who is known to Dr. Carlean Purl for previous complaints of dyspepsia in October 2014. She was supposed to followup in 6-8 weeks from that time, but so far has not returned. Anyway, she comes in to this appointment today as an urgent add-on visit with complaints of significant epigastric abdominal pain with nausea and vomiting. She states that this began on Friday, January 31 immediately after eating a slice of pizza. She says that since that time she has not really been able to keep anything down, even having a hard time keeping liquids down at times. She does take pantoprazole 40 mg daily and has even been trying Pepcid AC along with her prescribed dicyclomine without relief.  She says that she has never had similar symptoms in the past. Denies alcohol use. Did take some Advil PM for approximately one week.   Current Medications, Allergies, Past Medical History, Past Surgical History, Family History and Social History were reviewed in [employer] record.   Physical Exam: BP 124/72  Pulse 60  Ht 5\' 8"  (1.727 m)  Wt 192 lb 3.2 oz (87.181 kg)  BMI 29.23 kg/m2 General: Well developed black female in no acute distress; non-toxic appearing. Head: Normocephalic and atraumatic Eyes:  Sclerae anicteric, conjunctiva pink  Ears: Normal auditory acuity Lungs: Clear throughout to auscultation Heart: Regular rate and rhythm Abdomen: Soft, non-distended.  Normal bowel sounds.  Moderate epigastric TTP without R/R/G.  ? Some possible fullness noted in her epigastrium. Musculoskeletal: Symmetrical with no gross deformities  Extremities: No edema  Neurological: Alert oriented x 4, grossly non-focal Psychological:  Alert and cooperative. Normal mood and affect  Assessment and Recommendations: -Epigastric abdominal pain with nausea and vomiting:  Unsure of the cause of her symptoms  and inability to eat.  ? Pancreatitis vs gallbladder etiology vs ulcer disease.  Will check CBC, CMP, and amylase/lipase today.  Will get CT scan of the abdomen with contrast tomorrow AM if possible.  In the interim she will increase her pantoprazole to twice daily.  I offered to give her a prescription for anti-emetic and pain medication, however, she declined.  Will be in contact with her after her CT scan.

## 2014-01-22 NOTE — ED Notes (Signed)
Unable to collect blood work at this time.

## 2014-01-22 NOTE — Patient Instructions (Signed)
Please go to the basement level to have your labs drawn.    You have been scheduled for a CT scan of the abdomen and pelvis at [facility] ([address]---this is in the same building as [facility]).   You are scheduled on 01-23-2014 at 4:00 PM . You should arrive at 3:45 PM  prior to your appointment time for registration. Please follow the written instructions below on the day of your exam:  WARNING: IF YOU ARE ALLERGIC TO IODINE/X-RAY DYE, PLEASE NOTIFY RADIOLOGY IMMEDIATELY AT [phone]! YOU WILL BE GIVEN A 13 HOUR PREMEDICATION PREP.  1) Do not eat or drink anything after 12:00 Noon (4 hours prior to your test) 2) You have been given 2 bottles of oral contrast to drink. The solution may taste better if refrigerated, but do NOT add ice or any other liquid to this solution. Shake  well before drinking.    Drink 1 bottle of contrast @ 2:00 PM  (2 hours prior to your exam)  Drink 1 bottle of contrast @ 3:00 PM  (1 hour prior to your exam)  You may take any medications as prescribed with a small amount of water except for the following: Metformin, Glucophage, Glucovance, Avandamet, Riomet, Fortamet, Actoplus Met, Janumet, Glumetza or Metaglip. The above medications must be held the day of the exam AND 48 hours after the exam.  The purpose of you drinking the oral contrast is to aid in the visualization of your intestinal tract. The contrast solution may cause some diarrhea. Before your exam is started, you will be given a small amount of fluid to drink. Depending on your individual set of symptoms, you may also receive an intravenous injection of x-ray contrast/dye. Plan on being at [facility] for 30 minutes or long, depending on the type of exam you are having performed.  If you have any questions regarding your exam or if you need to reschedule, you may call the CT department at [phone] between the hours of 8:00 am and 5:00 pm,  Monday-Friday.  ________________________________________________________________________

## 2014-01-22 NOTE — ED Notes (Signed)
PA at bedside.

## 2014-01-22 NOTE — ED Provider Notes (Signed)
CSN: [id number]     Arrival date & time 01/22/14  1900 History   First MD Initiated Contact with Patient 01/22/14 2119     Chief Complaint  Patient presents with  . Abdominal Pain   (Consider location/radiation/quality/duration/timing/severity/associated sxs/prior Treatment) The history is provided by the patient and medical records.   This is a 48 year old female with past medical history significant for hypertension, depression, anemia, presenting to the ED for epigastric abdominal pain, nausea, and vomiting for the past 3 days. Pain is described as sharp, and nonradiating. Bowel movements have been normal, non-bloody. Patient was seen at [facility] earlier today with suspicion of acute pancreatitis. Lab work was completed but have not been resulted yet. She states since returning home pain has intensified. She has not attempted any PO intake today.  No prior hx of pancreatitis.  Denies EtOH use.  No prior hx of gallstones.  Pt is scheduled for OP CT in the morning.  VS stable on arrival.  Past Medical History  Diagnosis Date  . Depression   . Herpes genitalia   . Anemia   . Hypertension   . Uterine cyst   . Menorrhagia   . Iron deficiency anemia due to chronic blood loss   . Mass of uterus 10/07/2013   Past Surgical History  Procedure Laterality Date  . Tubal ligation     Family History  Problem Relation Age of Onset  . Hypertension Mother   . Hypertension Father   . Diabetes Father   . Cancer Neg Hx   . Alcohol abuse Neg Hx   . Early death Neg Hx   . Hearing loss Neg Hx   . Heart disease Neg Hx   . Hyperlipidemia Neg Hx   . Kidney disease Neg Hx   . Stroke Neg Hx    History  Substance Use Topics  . Smoking status: Never Smoker   . Smokeless tobacco: Never Used  . Alcohol Use: No   OB History   Grav Para Term Preterm Abortions TAB SAB Ect Mult Living                 Review of Systems  Gastrointestinal: Positive for nausea, vomiting and abdominal pain.  All  other systems reviewed and are negative.    Allergies  Review of patient's allergies indicates no known allergies.  Home Medications   Current Outpatient Rx  Name  Route  Sig  Dispense  Refill  . acyclovir (ZOVIRAX) 800 MG tablet   Oral   Take 1 tablet (800 mg total) by mouth 3 (three) times daily.   30 tablet   5   . dicyclomine (BENTYL) 20 MG tablet   Oral   Take 1 tablet (20 mg total) by mouth 3 (three) times daily before meals. May take 1/2 tab or less than 3x/day   90 tablet   1   . DULoxetine (CYMBALTA) 60 MG capsule   Oral   Take 1 capsule (60 mg total) by mouth daily.   90 capsule   3   . EXPIRED: ferrous sulfate (FEOSOL) 325 (65 FE) MG tablet   Oral   Take 1 tablet (325 mg total) by mouth 3 (three) times daily with meals.   90 tablet   11   . Metoprolol-Hydrochlorothiazide (DUTOPROL) 50-12.5 MG TB24   Oral   Take 1 tablet by mouth daily.   90 tablet   3   . pantoprazole (PROTONIX) 40 MG tablet   Oral  Take 1 tablet (40 mg total) by mouth daily before breakfast.   30 tablet   1    BP 143/97  Pulse 94  Temp(Src) 99.2 F (37.3 C) (Oral)  Resp 16  SpO2 99%  Physical Exam  Nursing note and vitals reviewed. Constitutional: She is oriented to person, place, and time. She appears well-developed and well-nourished. No distress.  HENT:  Head: Normocephalic and atraumatic.  Mouth/Throat: Oropharynx is clear and moist.  Eyes: Conjunctivae and EOM are normal. Pupils are equal, round, and reactive to light.  Neck: Normal range of motion. Neck supple.  Cardiovascular: Normal rate, regular rhythm and normal heart sounds.   Pulmonary/Chest: Effort normal and breath sounds normal. No respiratory distress. She has no wheezes.  Abdominal: Soft. Bowel sounds are normal. There is tenderness in the right upper quadrant and epigastric area. There is no guarding.  Abdomen soft, non-distended, epigastric > RUQ TTP  Musculoskeletal: Normal range of motion.   Neurological: She is alert and oriented to person, place, and time.  Skin: Skin is warm and dry. She is not diaphoretic.  Psychiatric: She has a normal mood and affect.    ED Course  Procedures (including critical care time) Labs Review Labs Reviewed  CBC WITH DIFFERENTIAL - Abnormal; Notable for the following:    Hemoglobin 11.8 (*)    Platelets 454 (*)    All other components within normal limits  URINALYSIS, ROUTINE W REFLEX MICROSCOPIC - Abnormal; Notable for the following:    Ketones, ur 40 (*)    All other components within normal limits  COMPREHENSIVE METABOLIC PANEL  LIPASE, BLOOD  PREGNANCY, URINE  POCT I-STAT TROPONIN I   Imaging Review US Abdomen Complete  01/22/2014   CLINICAL DATA:  Right upper quadrant and epigastric pain.  EXAM: ULTRASOUND ABDOMEN COMPLETE  COMPARISON:  None.  FINDINGS: Gallbladder:  No gallstones or wall thickening visualized. No sonographic Murphy sign noted.  Common bile duct:  Diameter: 3 mm  Liver:  No focal lesion identified. Within normal limits in parenchymal echogenicity.  IVC:  No abnormality visualized.  Pancreas:  Visualized portion unremarkable.  Spleen:  Size and appearance within normal limits.  Right Kidney:  Length: 9 cm. Echogenicity within normal limits. No mass or hydronephrosis visualized.  Left Kidney:  Length: 8.5 cm. No hydronephrosis. Further evaluation limited due to narrow sonographic windows.  Abdominal aorta:  No aneurysm visualized.  Other findings:  None.  IMPRESSION: Negative abdominal ultrasound.   Electronically Signed   By: Jorje Guild M.D.   On: 01/22/2014 23:07   Ct Abdomen Pelvis W Contrast  01/23/2014   CLINICAL DATA:  Abdominal pain, nausea and vomiting for 3 days. Possible pancreatitis.  EXAM: CT ABDOMEN AND PELVIS WITH CONTRAST  TECHNIQUE: Multidetector CT imaging of the abdomen and pelvis was performed using the standard protocol following bolus administration of intravenous contrast.  CONTRAST:  50 mL OMNIPAQUE  IOHEXOL 300 MG/ML SOLN, 100 mL OMNIPAQUE IOHEXOL 300 MG/ML SOLN  COMPARISON:  CT abdomen and pelvis 01/03/2013.  FINDINGS: Mild dependent atelectasis is seen in the lung bases. No pleural or pericardial effusion.  A few small hepatic cysts are identified and unchanged. The liver is otherwise unremarkable. The gallbladder, biliary tree, adrenal glands, spleen, kidneys and pancreas appear normal. Fibroid uterus is identified. Adnexa are unremarkable. Urinary bladder appears normal. The stomach, small and large bowel and appendix appear normal. Small fat containing umbilical hernia is noted. Schmorl's node in the inferior endplate of L5 is seen. No worrisome  bony lesion.  IMPRESSION: Negative for pancreatitis.  No acute finding.   Electronically Signed   By: Inge Rise M.D.   On: 01/23/2014 00:57    EKG Interpretation    Date/Time:  Wednesday January 22 2014 21:51:53 EST Ventricular Rate:  76 PR Interval:  143 QRS Duration: 77 QT Interval:  391 QTC Calculation: 440 R Axis:   59 Text Interpretation:  Age not entered, assumed to be  48 years old for purpose of ECG interpretation Sinus rhythm Low voltage, precordial leads Abnormal R-wave progression, early transition Borderline T abnormalities, anterior leads Similar to prior Confirmed by Mingo Amber  MD, [place] ([id number]) on 01/22/2014 10:17:52 PM            MDM   1. Epigastric abdominal pain   2. Nausea & vomiting    EKG NSR, no acute ischemic changes.  Trop negative.  Labs as above-- some variance in Hgb in the ED compared with labs drawn earlier-- 11.8 in ED vs. 13.3 from GI office earlier.  Pt is not having any black/tarry stool, no hematemesis, VS stable-- feel this is likely a variance in lab collection/processing and not actual levels.  Lipase WNL.  U/s without acute findings.  Pt reassess, pain still rated 4/10 and she continues to feel nauseated.  She is scheduled for CT in the am but states she would feel more comfortable having this  done in the ED should there be critical results.  CT ordered, second round of meds ordered.  VS remain stable.  1:02 AM CT negative for acute findings.  Pt has since began vomiting-- given dose of zofran.  Pt signed out to PA Dammen at shift change-- Will monitor and initiate PO challenge.  If sx can be controlled, feel pt can be discharged with close GI FU if sx persist.  Larene Pickett, PA-C 01/23/14 0105

## 2014-01-23 ENCOUNTER — Telehealth: Payer: Self-pay | Admitting: Internal Medicine

## 2014-01-23 ENCOUNTER — Encounter: Payer: Self-pay | Admitting: Gastroenterology

## 2014-01-23 ENCOUNTER — Ambulatory Visit: Payer: 59 | Admitting: Gastroenterology

## 2014-01-23 ENCOUNTER — Inpatient Hospital Stay
Admission: RE | Admit: 2014-01-23 | Discharge: 2014-01-23 | Disposition: A | Discharge status: Home / Self Care | Payer: 59 | Source: Ambulatory Visit | Attending: Gastroenterology | Admitting: Gastroenterology

## 2014-01-23 ENCOUNTER — Encounter (HOSPITAL_COMMUNITY): Payer: Self-pay

## 2014-01-23 MED ORDER — PROMETHAZINE HCL 25 MG PO TABS: 25.0000 mg | ORAL_TABLET | Freq: Four times a day (QID) | ORAL | Status: DC | PRN

## 2014-01-23 MED ORDER — METOCLOPRAMIDE HCL 5 MG/ML IJ SOLN
10.0000 mg | INTRAMUSCULAR | Status: AC
  Administered 2014-01-23: 10 mg via INTRAVENOUS
  Filled 2014-01-23: qty 2

## 2014-01-23 MED ORDER — IOHEXOL 300 MG/ML  SOLN
100.0000 mL | Freq: Once | INTRAMUSCULAR | Status: AC | PRN
  Administered 2014-01-23: 100 mL via INTRAVENOUS

## 2014-01-23 MED ORDER — TRAMADOL HCL 50 MG PO TABS: 50.0000 mg | ORAL_TABLET | Freq: Four times a day (QID) | ORAL | Status: DC | PRN

## 2014-01-23 MED ORDER — PROMETHAZINE HCL 25 MG RE SUPP: 25.0000 mg | Freq: Four times a day (QID) | RECTAL | Status: DC | PRN

## 2014-01-23 NOTE — Discharge Instructions (Signed)
Your lab testing, ultrasound study and CAT scan of your abdomen and pelvis did not show any signs of emergent condition. There were found to have uterine fibroids. Please followup with the primary care provider. Please also followup with the gastroenterology specialist for continued evaluation and treatment.   Abdominal Pain, Adult Many things can cause abdominal pain. Usually, abdominal pain is not caused by a disease and will improve without treatment. It can often be observed and treated at home. Your health care provider will do a physical exam and possibly order blood tests and X-rays to help determine the seriousness of your pain. However, in many cases, more time must pass before a clear cause of the pain can be found. Before that point, your health care provider may not know if you need more testing or further treatment. HOME CARE INSTRUCTIONS  Monitor your abdominal pain for any changes. The following actions may help to alleviate any discomfort you are experiencing:  Only take over-the-counter or prescription medicines as directed by your health care provider.  Do not take laxatives unless directed to do so by your health care provider.  Try a clear liquid diet (broth, tea, or water) as directed by your health care provider. Slowly move to a bland diet as tolerated. SEEK MEDICAL CARE IF:  You have unexplained abdominal pain.  You have abdominal pain associated with nausea or diarrhea.  You have pain when you urinate or have a bowel movement.  You experience abdominal pain that wakes you in the night.  You have abdominal pain that is worsened or improved by eating food.  You have abdominal pain that is worsened with eating fatty foods. SEEK IMMEDIATE MEDICAL CARE IF:   Your pain does not go away within 2 hours.  You have a fever.  You keep throwing up (vomiting).  Your pain is felt only in portions of the abdomen, such as the right side or the left lower portion of the  abdomen.  You pass bloody or black tarry stools. MAKE SURE YOU:  Understand these instructions.   Will watch your condition.   Will get help right away if you are not doing well or get worse.  Document Released: 09/14/2005 Document Revised: 09/25/2013 Document Reviewed: 08/14/2013 [facility] Patient Information 2014 [place].

## 2014-01-23 NOTE — Telephone Encounter (Signed)
Patient reports that she has not had anything to eat or drink in 4 days.  According to ER records from 01/23/14 she was tolerating fluids in the ER.  She reports no further vomiting since 3:15 am.  She will not attempt po intake "I just can't tolerate anything"  I just want him to order an EGD".  She has not filled the prescription for Tramadol or phenergan that was given to her last night in the ER.  She refused prescription from [facility] Bogus, [place] for zofran that was offered in the office yesterday.  She did have her CT scan that was scheduled for today last night in the ER.  No acute findings found, see below for impression and findings.  She also had a normal US of the abdomen last night.  I have called her in phenergan suppositories and asked her to start on this then try ice chips and advance to clear liquids.  She is at work now.  Please advise next step.      FINDINGS:  Mild dependent atelectasis is seen in the lung bases. No pleural or  pericardial effusion.  A few small hepatic cysts are identified and unchanged. The liver is  otherwise unremarkable. The gallbladder, biliary tree, adrenal  glands, spleen, kidneys and pancreas appear normal. Fibroid uterus  is identified. Adnexa are unremarkable. Urinary bladder appears  normal. The stomach, small and large bowel and appendix appear  normal. Small fat containing umbilical hernia is noted. Schmorl's  node in the inferior endplate of L5 is seen. No worrisome bony  lesion.  IMPRESSION:  Negative for pancreatitis. No acute finding.

## 2014-01-23 NOTE — ED Notes (Addendum)
Pt attempting to drink apple juice at this time. Pt states that she does not have any nausea after taking a couple of sips of apple juice. Will re-evaluate to see if nausea continues while drinking juice.

## 2014-01-23 NOTE — ED Notes (Signed)
Patient transported to CT 

## 2014-01-23 NOTE — ED Notes (Addendum)
Pt unable to drink oral contrast. Pt starts to have dry heaves and emesis.

## 2014-01-23 NOTE — Progress Notes (Signed)
Agree with Haley Harding's management. 

## 2014-01-23 NOTE — ED Provider Notes (Signed)
Haley Harding S 1:00 AM patient discussed in signout. Patient with abdominal pains. Pains do seem worse with eating. CT scan pending. Unremarkable ultrasound. Labs unremarkable.  CT scan negative. There are uterine fibroids. Patient did develop some nausea and vomiting. Reglan ordered. Will reassess and give by mouth challenge.  2:30AM patient tolerating by mouth fluids. She is ready to return home at this time. GI referral provided. Prescription for Phenergan and tramadol.  Martie Lee, PA-C 01/23/14 [phone]

## 2014-01-23 NOTE — ED Notes (Signed)
Pt states that she is still having aching pain in her mid upper abdomen, rates pain at 4/10. Pt denies any nausea at this time. Pt able to tolerate sips of apple juice at this time.

## 2014-01-23 NOTE — ED Notes (Signed)
Pt given apple juice for fluid challenge. States her stomach is feeling upset, but she will attempt to drink some in 10 minutes.

## 2014-01-24 ENCOUNTER — Other Ambulatory Visit: Payer: Self-pay

## 2014-01-24 ENCOUNTER — Telehealth: Payer: Self-pay | Admitting: Internal Medicine

## 2014-01-24 ENCOUNTER — Encounter (HOSPITAL_COMMUNITY)
Admission: AD | Disposition: A | Discharge status: Home / Self Care | Payer: Self-pay | Source: Ambulatory Visit | Attending: Internal Medicine

## 2014-01-24 ENCOUNTER — Encounter (HOSPITAL_COMMUNITY): Payer: Self-pay

## 2014-01-24 ENCOUNTER — Ambulatory Visit (HOSPITAL_COMMUNITY)
Admission: AD | Admit: 2014-01-24 | Discharge: 2014-01-24 | Disposition: A | Discharge status: Home / Self Care | Payer: 59 | Source: Ambulatory Visit | Attending: Internal Medicine | Admitting: Internal Medicine

## 2014-01-24 DIAGNOSIS — R1013 Epigastric pain: Secondary | ICD-10-CM

## 2014-01-24 DIAGNOSIS — K2900 Acute gastritis without bleeding: Secondary | ICD-10-CM | POA: Insufficient documentation

## 2014-01-24 DIAGNOSIS — K2990 Gastroduodenitis, unspecified, without bleeding: Secondary | ICD-10-CM

## 2014-01-24 DIAGNOSIS — K269 Duodenal ulcer, unspecified as acute or chronic, without hemorrhage or perforation: Secondary | ICD-10-CM | POA: Insufficient documentation

## 2014-01-24 DIAGNOSIS — K222 Esophageal obstruction: Secondary | ICD-10-CM | POA: Insufficient documentation

## 2014-01-24 DIAGNOSIS — R112 Nausea with vomiting, unspecified: Secondary | ICD-10-CM

## 2014-01-24 DIAGNOSIS — K2970 Gastritis, unspecified, without bleeding: Secondary | ICD-10-CM

## 2014-01-24 DIAGNOSIS — K319 Disease of stomach and duodenum, unspecified: Secondary | ICD-10-CM | POA: Insufficient documentation

## 2014-01-24 DIAGNOSIS — K3189 Other diseases of stomach and duodenum: Secondary | ICD-10-CM

## 2014-01-24 DIAGNOSIS — K449 Diaphragmatic hernia without obstruction or gangrene: Secondary | ICD-10-CM | POA: Insufficient documentation

## 2014-01-24 HISTORY — PX: ESOPHAGOGASTRODUODENOSCOPY: SHX5428

## 2014-01-24 SURGERY — EGD (ESOPHAGOGASTRODUODENOSCOPY)
Anesthesia: Moderate Sedation

## 2014-01-24 MED ORDER — FENTANYL CITRATE 0.05 MG/ML IJ SOLN
INTRAMUSCULAR | Status: DC | PRN
  Administered 2014-01-24 (×3): 25 ug via INTRAVENOUS

## 2014-01-24 MED ORDER — FENTANYL CITRATE 0.05 MG/ML IJ SOLN
INTRAMUSCULAR | Status: AC
  Filled 2014-01-24: qty 4

## 2014-01-24 MED ORDER — SODIUM CHLORIDE 0.9 % IV SOLN
INTRAVENOUS | Status: DC
  Administered 2014-01-24: 14:00:00 via INTRAVENOUS

## 2014-01-24 MED ORDER — DIPHENHYDRAMINE HCL 50 MG/ML IJ SOLN
INTRAMUSCULAR | Status: AC
  Filled 2014-01-24: qty 1

## 2014-01-24 MED ORDER — MIDAZOLAM HCL 10 MG/2ML IJ SOLN
INTRAMUSCULAR | Status: AC
  Filled 2014-01-24: qty 4

## 2014-01-24 MED ORDER — MIDAZOLAM HCL 10 MG/2ML IJ SOLN
INTRAMUSCULAR | Status: DC | PRN
  Administered 2014-01-24 (×3): 2 mg via INTRAVENOUS

## 2014-01-24 NOTE — ED Provider Notes (Signed)
Medical screening examination/treatment/procedure(s) were performed by non-physician practitioner and as supervising physician I was immediately available for consultation/collaboration.  EKG Interpretation    Date/Time:  Wednesday January 22 2014 21:51:53 EST Ventricular Rate:  76 PR Interval:  143 QRS Duration: 77 QT Interval:  391 QTC Calculation: 440 R Axis:   59 Text Interpretation:  Age not entered, assumed to be  48 years old for purpose of ECG interpretation Sinus rhythm Low voltage, precordial leads Abnormal R-wave progression, early transition Borderline T abnormalities, anterior leads Similar to prior Confirmed by Mingo Amber  MD, [place] ([id number]) on 01/22/2014 10:17:52 PM              Osvaldo Shipper, MD 01/24/14 [phone]

## 2014-01-24 NOTE — Interval H&P Note (Signed)
History and Physical Interval Note: Ongoing nausea vomiting and inability to keep fluids/food down by mouth For EGD today Neg CT/US .The nature of the procedure, as well as the risks, benefits, and alternatives were carefully and thoroughly reviewed with the patient. Ample time for discussion and questions allowed. The patient understood, was satisfied, and agreed to proceed.      01/24/2014 3:33 PM  Haley Harding  has presented today for surgery, with the diagnosis of nausea and vomiting  The various methods of treatment have been discussed with the patient and family. After consideration of risks, benefits and other options for treatment, the patient has consented to  Procedure(s): ESOPHAGOGASTRODUODENOSCOPY (EGD) (N/A) as a surgical intervention .  The patient's history has been reviewed, patient examined, no change in status, stable for surgery.  I have reviewed the patient's chart and labs.  Questions were answered to the patient's satisfaction.     PYRTLE, [name]

## 2014-01-24 NOTE — Discharge Instructions (Signed)
YOU HAD AN ENDOSCOPIC PROCEDURE TODAY: Refer to the procedure report that was given to you for any specific questions about what was found during the examination.  If the procedure report does not answer your questions, please call your gastroenterologist to clarify. ° °YOU SHOULD EXPECT: Some feelings of bloating in the abdomen. Passage of more gas than usual.  Walking can help get rid of the air that was put into your GI tract during the procedure and reduce the bloating. If you had a lower endoscopy (such as a colonoscopy or flexible sigmoidoscopy) you may notice spotting of blood in your stool or on the toilet paper.  ° °DIET: Your first meal following the procedure should be a light meal and then it is ok to progress to your normal diet.  A half-sandwich or bowl of soup is an example of a good first meal.  Heavy or fried foods are harder to digest and may make you feel nasueas or bloated.  Drink plenty of fluids but you should avoid alcoholic beverages for 24 hours. ° °ACTIVITY: Your care partner should take you home directly after the procedure.  You should plan to take it easy, moving slowly for the rest of the day.  You can resume normal activity the day after the procedure however you should NOT DRIVE or use heavy machinery for 24 hours (because of the sedation medicines used during the test).   ° °SYMPTOMS TO REPORT IMMEDIATELY  °A gastroenterologist can be reached at any hour.  Please call your doctor's office for any of the following symptoms: ° °· Following lower endoscopy (colonoscopy, flexible sigmoidoscopy) ° Excessive amounts of blood in the stool ° Significant tenderness, worsening of abdominal pains ° Swelling of the abdomen that is new, acute ° Fever of 100° or higher °· Following upper endoscopy (EGD, EUS, ERCP) ° Vomiting of blood or coffee ground material ° New, significant abdominal pain ° New, significant chest pain or pain under the shoulder blades ° Painful or persistently difficult  swallowing ° New shortness of breath ° Black, tarry-looking stools ° °FOLLOW UP: °If any biopsies were taken you will be contacted by phone or by letter within the next 1-3 weeks.  Call your gastroenterologist if you have not heard about the biopsies in 3 weeks.  °Please also call your gastroenterologist's office with any specific questions about appointments or follow up tests. °Gastrointestinal Endoscopy °Care After °Refer to this sheet in the next few weeks. These instructions provide you with information on caring for yourself after your procedure. Your caregiver may also give you more specific instructions. Your treatment has been planned according to current medical practices, but problems sometimes occur. Call your caregiver if you have any problems or questions after your procedure. °HOME CARE INSTRUCTIONS °· If you were given medicine to help you relax (sedative), do not drive, operate machinery, or sign important documents for 24 hours. °· Avoid alcohol and hot or warm beverages for the first 24 hours after the procedure. °· Only take over-the-counter or prescription medicines for pain, discomfort, or fever as directed by your caregiver. You may resume taking your normal medicines unless your caregiver tells you otherwise. Ask your caregiver when you may resume taking medicines that may cause bleeding, such as aspirin, clopidogrel, or warfarin. °· You may return to your normal diet and activities on the day after your procedure, or as directed by your caregiver. Walking may help to reduce any bloated feeling in your abdomen. °· Drink enough fluids to   keep your urine clear or pale yellow. °· You may gargle with salt water if you have a sore throat. °SEEK IMMEDIATE MEDICAL CARE IF: °· You have severe nausea or vomiting. °· You have severe abdominal pain, abdominal cramps that last longer than 6 hours, or abdominal swelling (distention). °· You have severe shoulder or back pain. °· You have trouble  swallowing. °· You have shortness of breath, your breathing is shallow, or you are breathing faster than normal. °· You have a fever or a rapid heartbeat. °· You vomit blood or material that looks like coffee grounds. °· You have bloody, black, or tarry stools. °MAKE SURE YOU: °· Understand these instructions. °· Will watch your condition. °· Will get help right away if you are not doing well or get worse. °Document Released: 07/19/2004 Document Revised: 06/05/2012 Document Reviewed: 03/06/2012 °[employer]® Patient Information ©2014 [employer]. ° °

## 2014-01-24 NOTE — Telephone Encounter (Signed)
Intractable vomiting with negative work-up so far Came to work - still ill - Dr. Brett Fairy called me about the patient and asked for help on Ms. Haley Harding behalf.  I have spoken to Dr. Hilarie Fredrickson and he has agreed to perform an EGD on the patient today if we can get that scheduled.  Patient has not had solids in days and last liquids this AM - advised to stay NPO for now.

## 2014-01-24 NOTE — Telephone Encounter (Signed)
Per Dr. Carlean Purl and Dr. Hilarie Fredrickson patient needs EGD today.  She is scheduled for [facility] for 2:30 today.  I have left a message for the patient to call back and discuss

## 2014-01-24 NOTE — H&P (View-Only) (Signed)
Agree with Ms. Zehr's management. 

## 2014-01-24 NOTE — Telephone Encounter (Signed)
Patient aware of procedure scheduled for today.  She is aware to have clear liquids until 1030 then be NPO after 10:30

## 2014-01-24 NOTE — Telephone Encounter (Signed)
See phone note from yesterday for additional details.  

## 2014-01-24 NOTE — Op Note (Signed)
[facility] [place], [address]   ENDOSCOPY PROCEDURE REPORT  PATIENT: Haley Harding, Haley Harding  MR#: [id number] BIRTHDATE: [birth year] , 47  yrs. old GENDER: Female ENDOSCOPIST: Jerene Bears, MD REFERRED BY:  Gatha Mayer, M.D, [facility] PROCEDURE DATE:  01/24/2014 PROCEDURE:  EGD w/ biopsy ASA CLASS:     Class II INDICATIONS:  Nausea.   Vomiting.   Epigastric pain. MEDICATIONS: These medications were titrated to patient response per physician's verbal order, Fentanyl 75 mcg IV, and Versed 6 mg IV TOPICAL ANESTHETIC: Cetacaine Spray  DESCRIPTION OF PROCEDURE: After the risks benefits and alternatives of the procedure were thoroughly explained, informed consent was obtained.  The Pentax Gastroscope [id number] endoscope was introduced through the mouth and advanced to the second portion of the duodenum. Without limitations.  The instrument was slowly withdrawn as the mucosa was fully examined.     ESOPHAGUS: The mucosa of the esophagus appeared normal.  A partial, nonobstructing, Schatzki's ring was seen, 35 cm from the incisors. A 4 cm hiatal hernia was noted.  STOMACH: Acute gastritis (inflammation) was found in the gastric body and gastric antrum.  There were erosions present.  Multiple biopsies were performed using cold forceps.  DUODENUM: Moderate duodenal inflammation was found in the duodenal bulb with small, superficial, punctate ulcerations extending into the duodenal sweep.  Normal D2.  Retroflexed views revealed a hiatal hernia.     The scope was then withdrawn from the patient and the procedure completed.  COMPLICATIONS: There were no complications.  ENDOSCOPIC IMPRESSION: 1.   The mucosa of the esophagus appeared normal; nonobstructing Schatzki's ring 2.   4 cm hiatal hernia 3.   Acute gastritis (inflammation) was found in the gastric body and gastric antrum; multiple biopsies 4.   Duodenal inflammation was found in the duodenal  bulb  RECOMMENDATIONS: 1.  Await pathology results 2.  Follow-up of helicobacter pylori status, treat if indicated 3.  Continue taking your PPI (pantoprazole) twice daily.  It is best to be taken 20-30 minutes prior to breakfast meal. 4.  Continue anti-emetics as needed  eSigned:  Jerene Bears, MD 01/24/2014 4:07 PM   CC:The Patient and Silvano Rusk, MD  PATIENT NAME:  [name], [name] MR#: [id number]

## 2014-01-27 ENCOUNTER — Encounter (HOSPITAL_COMMUNITY): Payer: Self-pay | Admitting: Internal Medicine

## 2014-01-28 NOTE — ED Provider Notes (Signed)
Medical screening examination/treatment/procedure(s) were performed by non-physician practitioner and as supervising physician I was immediately available for consultation/collaboration.  EKG Interpretation    Date/Time:  Wednesday January 22 2014 21:51:53 EST Ventricular Rate:  76 PR Interval:  143 QRS Duration: 77 QT Interval:  391 QTC Calculation: 440 R Axis:   59 Text Interpretation:  Age not entered, assumed to be  48 years old for purpose of ECG interpretation Sinus rhythm Low voltage, precordial leads Abnormal R-wave progression, early transition Borderline T abnormalities, anterior leads Similar to prior Confirmed by Mingo Amber  MD, [place] ([id number]) on 01/22/2014 10:17:52 PM              Osvaldo Shipper, MD 01/28/14 [phone]

## 2014-01-29 NOTE — Progress Notes (Signed)
Quick Note:  Please contact her re: results and get f/u - as well as arrange REV in March ______

## 2014-01-31 ENCOUNTER — Other Ambulatory Visit: Payer: Self-pay | Admitting: Internal Medicine

## 2014-02-12 ENCOUNTER — Encounter: Payer: Self-pay | Admitting: Internal Medicine

## 2014-02-12 ENCOUNTER — Ambulatory Visit (INDEPENDENT_AMBULATORY_CARE_PROVIDER_SITE_OTHER): Payer: 59 | Admitting: Internal Medicine

## 2014-02-12 VITALS — BP 138/80 | HR 76 | Ht 68.0 in | Wt 193.0 lb

## 2014-02-12 DIAGNOSIS — K2990 Gastroduodenitis, unspecified, without bleeding: Secondary | ICD-10-CM

## 2014-02-12 DIAGNOSIS — K2980 Duodenitis without bleeding: Secondary | ICD-10-CM

## 2014-02-12 DIAGNOSIS — K2970 Gastritis, unspecified, without bleeding: Secondary | ICD-10-CM

## 2014-02-12 DIAGNOSIS — K219 Gastro-esophageal reflux disease without esophagitis: Secondary | ICD-10-CM

## 2014-02-12 NOTE — Progress Notes (Signed)
         Subjective:    Patient ID: [name], female    DOB: 08-Jun-1966, 48 y.o.   MRN: [id number]  HPI The patient is here for f/u - she had nausea and vomiting - intractable - and abdominal pain. Ended up in ED with negative labs, CT, [place]. Promethazine suppositories provided relief. EGD showed nonspecific erosive gastritis and duodenitis. Bid PPi used. Over time she has improved and though still having some nausea, no vomiting and pain is much less.  Sxs had begun shortly after eating at [employer])  Medications, allergies, past medical history, past surgical history, family history and social history are reviewed and updated in the EMR.   Review of Systems As above    Objective:   Physical Exam WDWN NAD    Assessment & Plan:  Gastritis  Duodenitis  GERD (gastroesophageal reflux disease)  Cause not clear - ? Food poisoning with prolonged response Continue daily PPI Keep promethazine on hand F/u prn

## 2014-02-12 NOTE — Patient Instructions (Signed)
I am glad you are improving. Please continue your current medications and let me know if things change for the worse.  I appreciate the opportunity to care for you. Gatha Mayer, MD, [facility]

## 2014-02-20 ENCOUNTER — Other Ambulatory Visit: Payer: Self-pay | Admitting: Internal Medicine

## 2014-04-17 ENCOUNTER — Other Ambulatory Visit: Payer: Self-pay | Admitting: Internal Medicine

## 2014-05-13 ENCOUNTER — Telehealth: Payer: Self-pay | Admitting: *Deleted

## 2014-05-13 DIAGNOSIS — I10 Essential (primary) hypertension: Secondary | ICD-10-CM

## 2014-05-13 MED ORDER — HYDROCHLOROTHIAZIDE 12.5 MG PO CAPS: 12.5000 mg | ORAL_CAPSULE | Freq: Every day | ORAL | Status: DC

## 2014-05-13 MED ORDER — CARVEDILOL 12.5 MG PO TABS: 12.5000 mg | ORAL_TABLET | Freq: Two times a day (BID) | ORAL | Status: DC

## 2014-05-13 NOTE — Telephone Encounter (Signed)
Pt called states BP medication is too expensive , she is requesting a less expensive Rx.  Please advise

## 2014-05-13 NOTE — Telephone Encounter (Signed)
changed

## 2014-05-14 NOTE — Telephone Encounter (Signed)
Left message on VM Rx sent 

## 2014-10-10 ENCOUNTER — Encounter: Payer: 59 | Admitting: Internal Medicine

## 2014-10-17 ENCOUNTER — Encounter: Payer: 59 | Admitting: Internal Medicine

## 2014-10-27 ENCOUNTER — Other Ambulatory Visit (INDEPENDENT_AMBULATORY_CARE_PROVIDER_SITE_OTHER): Payer: 59

## 2014-10-27 ENCOUNTER — Encounter: Payer: Self-pay | Admitting: Internal Medicine

## 2014-10-27 ENCOUNTER — Ambulatory Visit (INDEPENDENT_AMBULATORY_CARE_PROVIDER_SITE_OTHER): Payer: 59 | Admitting: Internal Medicine

## 2014-10-27 VITALS — BP 150/102 | HR 84 | Temp 98.4°F | Resp 16 | Ht 68.0 in | Wt 182.0 lb

## 2014-10-27 DIAGNOSIS — Z0000 Encounter for general adult medical examination without abnormal findings: Secondary | ICD-10-CM

## 2014-10-27 DIAGNOSIS — D500 Iron deficiency anemia secondary to blood loss (chronic): Secondary | ICD-10-CM

## 2014-10-27 DIAGNOSIS — N920 Excessive and frequent menstruation with regular cycle: Secondary | ICD-10-CM

## 2014-10-27 DIAGNOSIS — I10 Essential (primary) hypertension: Secondary | ICD-10-CM

## 2014-10-27 LAB — COMPREHENSIVE METABOLIC PANEL
ALBUMIN: 3.2 g/dL — AB (ref 3.5–5.2)
ALT: 13 U/L (ref 0–35)
AST: 14 U/L (ref 0–37)
Alkaline Phosphatase: 73 U/L (ref 39–117)
BUN: 10 mg/dL (ref 6–23)
CHLORIDE: 106 meq/L (ref 96–112)
CO2: 26 meq/L (ref 19–32)
Calcium: 8.9 mg/dL (ref 8.4–10.5)
Creatinine, Ser: 0.6 mg/dL (ref 0.4–1.2)
GFR: 132.58 mL/min (ref >60.00)
Glucose, Bld: 84 mg/dL (ref 70–99)
POTASSIUM: 3.4 meq/L — AB (ref 3.5–5.1)
Sodium: 139 mEq/L (ref 135–145)
Total Bilirubin: 0.3 mg/dL (ref 0.2–1.2)
Total Protein: 7.2 g/dL (ref 6.0–8.3)

## 2014-10-27 LAB — IBC PANEL
IRON: 22 ug/dL — AB (ref 42–145)
Saturation Ratios: 5.1 % — ABNORMAL LOW (ref 20.0–50.0)
Transferrin: 305.4 mg/dL (ref 212.0–360.0)

## 2014-10-27 LAB — CBC WITH DIFFERENTIAL/PLATELET
Basophils Absolute: 0.2 10*3/uL — ABNORMAL HIGH (ref 0.0–0.1)
Basophils Relative: 2.5 % (ref 0.0–3.0)
EOS ABS: 0.3 10*3/uL (ref 0.0–0.7)
Eosinophils Relative: 4.6 % (ref 0.0–5.0)
HCT: 33 % — ABNORMAL LOW (ref 36.0–46.0)
Hemoglobin: 10.5 g/dL — ABNORMAL LOW (ref 12.0–15.0)
LYMPHS PCT: 23.9 % (ref 12.0–46.0)
Lymphs Abs: 1.5 10*3/uL (ref 0.7–4.0)
MCHC: 31.9 g/dL (ref 30.0–36.0)
MCV: 77.4 fl — ABNORMAL LOW (ref 78.0–100.0)
Monocytes Absolute: 0.6 10*3/uL (ref 0.1–1.0)
Monocytes Relative: 9.4 % (ref 3.0–12.0)
NEUTROS PCT: 59.6 % (ref 43.0–77.0)
Neutro Abs: 3.8 10*3/uL (ref 1.4–7.7)
Platelets: 552 10*3/uL — ABNORMAL HIGH (ref 150.0–400.0)
RBC: 4.26 Mil/uL (ref 3.87–5.11)
RDW: 18.4 % — AB (ref 11.5–15.5)
WBC: 6.3 10*3/uL (ref 4.0–10.5)

## 2014-10-27 LAB — URINALYSIS, ROUTINE W REFLEX MICROSCOPIC
Bilirubin Urine: NEGATIVE
HGB URINE DIPSTICK: NEGATIVE
Ketones, ur: NEGATIVE
LEUKOCYTES UA: NEGATIVE
Nitrite: NEGATIVE
RBC / HPF: NONE SEEN (ref 0–?)
Specific Gravity, Urine: 1.02 (ref 1.000–1.030)
Total Protein, Urine: NEGATIVE
Urine Glucose: NEGATIVE
Urobilinogen, UA: 0.2 (ref 0.0–1.0)
WBC, UA: NONE SEEN (ref 0–?)
pH: 6.5 (ref 5.0–8.0)

## 2014-10-27 LAB — LIPID PANEL
CHOL/HDL RATIO: 4
Cholesterol: 158 mg/dL (ref 0–200)
HDL: 38.6 mg/dL — ABNORMAL LOW (ref >39.00)
LDL Cholesterol: 108 mg/dL — ABNORMAL HIGH (ref 0–99)
NonHDL: 119.4
Triglycerides: 57 mg/dL (ref 0.0–149.0)
VLDL: 11.4 mg/dL (ref 0.0–40.0)

## 2014-10-27 LAB — FERRITIN: FERRITIN: 7.8 ng/mL — AB (ref 10.0–291.0)

## 2014-10-27 LAB — TSH: TSH: 0.56 u[IU]/mL (ref 0.35–4.50)

## 2014-10-27 MED ORDER — AMLODIPINE BESYLATE 5 MG PO TABS: 5.0000 mg | ORAL_TABLET | Freq: Every day | ORAL | Status: DC

## 2014-10-27 NOTE — Assessment & Plan Note (Signed)
Her BP is not well controlled as she has not been compliant I have asked her to restart the HCTZ consistently At her request will stop the coreg Will start amlodipine for better BP control

## 2014-10-27 NOTE — Assessment & Plan Note (Signed)
She will follow up soon with her GYN I have encouraged her to have this treated

## 2014-10-27 NOTE — Progress Notes (Signed)
Pre visit review using our clinic review tool, if applicable. No additional management support is needed unless otherwise documented below in the visit note. 

## 2014-10-27 NOTE — Patient Instructions (Signed)
Health Maintenance Adopting a healthy lifestyle and getting preventive care can go a long way to promote health and wellness. Talk with your health care provider about what schedule of regular examinations is right for you. This is a good chance for you to check in with your provider about disease prevention and staying healthy. In between checkups, there are plenty of things you can do on your own. Experts have done a lot of research about which lifestyle changes and preventive measures are most likely to keep you healthy. Ask your health care provider for more information. WEIGHT AND DIET  Eat a healthy diet  Be sure to include plenty of vegetables, fruits, low-fat dairy products, and lean protein.  Do not eat a lot of foods high in solid fats, added sugars, or salt.  Get regular exercise. This is one of the most important things you can do for your health.  Most adults should exercise for at least 150 minutes each week. The exercise should increase your heart rate and make you sweat (moderate-intensity exercise).  Most adults should also do strengthening exercises at least twice a week. This is in addition to the moderate-intensity exercise.  Maintain a healthy weight  Body mass index (BMI) is a measurement that can be used to identify possible weight problems. It estimates body fat based on height and weight. Your health care provider can help determine your BMI and help you achieve or maintain a healthy weight.  For females 25 years of age and older:   A BMI below 18.5 is considered underweight.  A BMI of 18.5 to 24.9 is normal.  A BMI of 25 to 29.9 is considered overweight.  A BMI of 30 and above is considered obese.  Watch levels of cholesterol and blood lipids  You should start having your blood tested for lipids and cholesterol at 47 years of age, then have this test every 5 years.  You may need to have your cholesterol levels checked more often if:  Your lipid or  cholesterol levels are high.  You are older than 47 years of age.  You are at high risk for heart disease.  CANCER SCREENING   Lung Cancer  Lung cancer screening is recommended for adults 47-92 years old who are at high risk for lung cancer because of a history of smoking.  A yearly low-dose CT scan of the lungs is recommended for people who:  Currently smoke.  Have quit within the past 15 years.  Have at least a 30-pack-year history of smoking. A pack year is smoking an average of one pack of cigarettes a day for 1 year.  Yearly screening should continue until it has been 15 years since you quit.  Yearly screening should stop if you develop a health problem that would prevent you from having lung cancer treatment.  Breast Cancer  Practice breast self-awareness. This means understanding how your breasts normally appear and feel.  It also means doing regular breast self-exams. Let your health care provider know about any changes, no matter how small.  If you are in your 20s or 30s, you should have a clinical breast exam (CBE) by a health care provider every 1-3 years as part of a regular health exam.  If you are 76 or older, have a CBE every year. Also consider having a breast X-ray (mammogram) every year.  If you have a family history of breast cancer, talk to your health care provider about genetic screening.  If you are  at high risk for breast cancer, talk to your health care provider about having an MRI and a mammogram every year.  Breast cancer gene (BRCA) assessment is recommended for women who have family members with BRCA-related cancers. BRCA-related cancers include:  Breast.  Ovarian.  Tubal.  Peritoneal cancers.  Results of the assessment will determine the need for genetic counseling and BRCA1 and BRCA2 testing. Cervical Cancer Routine pelvic examinations to screen for cervical cancer are no longer recommended for nonpregnant women who are considered low  risk for cancer of the pelvic organs (ovaries, uterus, and vagina) and who do not have symptoms. A pelvic examination may be necessary if you have symptoms including those associated with pelvic infections. Ask your health care provider if a screening pelvic exam is right for you.   The Pap test is the screening test for cervical cancer for women who are considered at risk.  If you had a hysterectomy for a problem that was not cancer or a condition that could lead to cancer, then you no longer need Pap tests.  If you are older than 47 years, and you have had normal Pap tests for the past 10 years, you no longer need to have Pap tests.  If you have had past treatment for cervical cancer or a condition that could lead to cancer, you need Pap tests and screening for cancer for at least 20 years after your treatment.  If you no longer get a Pap test, assess your risk factors if they change (such as having a new sexual partner). This can affect whether you should start being screened again.  Some women have medical problems that increase their chance of getting cervical cancer. If this is the case for you, your health care provider may recommend more frequent screening and Pap tests.  The human papillomavirus (HPV) test is another test that may be used for cervical cancer screening. The HPV test looks for the virus that can cause cell changes in the cervix. The cells collected during the Pap test can be tested for HPV.  The HPV test can be used to screen women 30 years of age and older. Getting tested for HPV can extend the interval between normal Pap tests from three to five years.  An HPV test also should be used to screen women of any age who have unclear Pap test results.  After 47 years of age, women should have HPV testing as often as Pap tests.  Colorectal Cancer  This type of cancer can be detected and often prevented.  Routine colorectal cancer screening usually begins at 47 years of  age and continues through 47 years of age.  Your health care provider may recommend screening at an earlier age if you have risk factors for colon cancer.  Your health care provider may also recommend using home test kits to check for hidden blood in the stool.  A small camera at the end of a tube can be used to examine your colon directly (sigmoidoscopy or colonoscopy). This is done to check for the earliest forms of colorectal cancer.  Routine screening usually begins at age 50.  Direct examination of the colon should be repeated every 5-10 years through 47 years of age. However, you may need to be screened more often if early forms of precancerous polyps or small growths are found. Skin Cancer  Check your skin from head to toe regularly.  Tell your health care provider about any new moles or changes in   moles, especially if there is a change in a mole's shape or color.  Also tell your health care provider if you have a mole that is larger than the size of a pencil eraser.  Always use sunscreen. Apply sunscreen liberally and repeatedly throughout the day.  Protect yourself by wearing long sleeves, pants, a wide-brimmed hat, and sunglasses whenever you are outside. HEART DISEASE, DIABETES, AND HIGH BLOOD PRESSURE   Have your blood pressure checked at least every 1-2 years. High blood pressure causes heart disease and increases the risk of stroke.  If you are between 75 years and 42 years old, ask your health care provider if you should take aspirin to prevent strokes.  Have regular diabetes screenings. This involves taking a blood sample to check your fasting blood sugar level.  If you are at a normal weight and have a low risk for diabetes, have this test once every three years after 47 years of age.  If you are overweight and have a high risk for diabetes, consider being tested at a younger age or more often. PREVENTING INFECTION  Hepatitis B  If you have a higher risk for  hepatitis B, you should be screened for this virus. You are considered at high risk for hepatitis B if:  You were born in a country where hepatitis B is common. Ask your health care provider which countries are considered high risk.  Your parents were born in a high-risk country, and you have not been immunized against hepatitis B (hepatitis B vaccine).  You have HIV or AIDS.  You use needles to inject street drugs.  You live with someone who has hepatitis B.  You have had sex with someone who has hepatitis B.  You get hemodialysis treatment.  You take certain medicines for conditions, including cancer, organ transplantation, and autoimmune conditions. Hepatitis C  Blood testing is recommended for:  Everyone born from [birth year] through 1964.  Anyone with known risk factors for hepatitis C. Sexually transmitted infections (STIs)  You should be screened for sexually transmitted infections (STIs) including gonorrhea and chlamydia if:  You are sexually active and are younger than 48 years of age.  You are older than 47 years of age and your health care provider tells you that you are at risk for this type of infection.  Your sexual activity has changed since you were last screened and you are at an increased risk for chlamydia or gonorrhea. Ask your health care provider if you are at risk.  If you do not have HIV, but are at risk, it may be recommended that you take a prescription medicine daily to prevent HIV infection. This is called pre-exposure prophylaxis (PrEP). You are considered at risk if:  You are sexually active and do not regularly use condoms or know the HIV status of your partner(s).  You take drugs by injection.  You are sexually active with a partner who has HIV. Talk with your health care provider about whether you are at high risk of being infected with HIV. If you choose to begin PrEP, you should first be tested for HIV. You should then be tested every 3 months for  as long as you are taking PrEP.  PREGNANCY   If you are premenopausal and you may become pregnant, ask your health care provider about preconception counseling.  If you may become pregnant, take 400 to 800 micrograms (mcg) of folic acid every day.  If you want to prevent pregnancy, talk to your  health care provider about birth control (contraception). OSTEOPOROSIS AND MENOPAUSE   Osteoporosis is a disease in which the bones lose minerals and strength with aging. This can result in serious bone fractures. Your risk for osteoporosis can be identified using a bone density scan.  If you are 65 years of age or older, or if you are at risk for osteoporosis and fractures, ask your health care provider if you should be screened.  Ask your health care provider whether you should take a calcium or vitamin D supplement to lower your risk for osteoporosis.  Menopause may have certain physical symptoms and risks.  Hormone replacement therapy may reduce some of these symptoms and risks. Talk to your health care provider about whether hormone replacement therapy is right for you.  HOME CARE INSTRUCTIONS   Schedule regular health, dental, and eye exams.  Stay current with your immunizations.   Do not use any tobacco products including cigarettes, chewing tobacco, or electronic cigarettes.  If you are pregnant, do not drink alcohol.  If you are breastfeeding, limit how much and how often you drink alcohol.  Limit alcohol intake to no more than 1 drink per day for nonpregnant women. One drink equals 12 ounces of beer, 5 ounces of wine, or 1 ounces of hard liquor.  Do not use street drugs.  Do not share needles.  Ask your health care provider for help if you need support or information about quitting drugs.  Tell your health care provider if you often feel depressed.  Tell your health care provider if you have ever been abused or do not feel safe at home. Document Released: 06/20/2011  Document Revised: 04/21/2014 Document Reviewed: 11/06/2013 Exit[employer] Patient Information 2015 [employer]. This information is not intended to replace advice given to you by your health care provider. Make sure you discuss any questions you have with your health care provider. Hypertension Hypertension, commonly called high blood pressure, is when the force of blood pumping through your arteries is too strong. Your arteries are the blood vessels that carry blood from your heart throughout your body. A blood pressure reading consists of a higher number over a lower number, such as 110/72. The higher number (systolic) is the pressure inside your arteries when your heart pumps. The lower number (diastolic) is the pressure inside your arteries when your heart relaxes. Ideally you want your blood pressure below 120/80. Hypertension forces your heart to work harder to pump blood. Your arteries may become narrow or stiff. Having hypertension puts you at risk for heart disease, stroke, and other problems.  RISK FACTORS Some risk factors for high blood pressure are controllable. Others are not.  Risk factors you cannot control include:   Race. You may be at higher risk if you are African American.  Age. Risk increases with age.  Gender. Men are at higher risk than women before age 45 years. After age 65, women are at higher risk than men. Risk factors you can control include:  Not getting enough exercise or physical activity.  Being overweight.  Getting too much fat, sugar, calories, or salt in your diet.  Drinking too much alcohol. SIGNS AND SYMPTOMS Hypertension does not usually cause signs or symptoms. Extremely high blood pressure (hypertensive crisis) may cause headache, anxiety, shortness of breath, and nosebleed. DIAGNOSIS  To check if you have hypertension, your health care provider will measure your blood pressure while you are seated, with your arm held at the level of your heart. It    should be measured at least twice using the same arm. Certain conditions can cause a difference in blood pressure between your right and left arms. A blood pressure reading that is higher than normal on one occasion does not mean that you need treatment. If one blood pressure reading is high, ask your health care provider about having it checked again. TREATMENT  Treating high blood pressure includes making lifestyle changes and possibly taking medicine. Living a healthy lifestyle can help lower high blood pressure. You may need to change some of your habits. Lifestyle changes may include:  Following the DASH diet. This diet is high in fruits, vegetables, and whole grains. It is low in salt, red meat, and added sugars.  Getting at least 2 hours of brisk physical activity every week.  Losing weight if necessary.  Not smoking.  Limiting alcoholic beverages.  Learning ways to reduce stress. If lifestyle changes are not enough to get your blood pressure under control, your health care provider may prescribe medicine. You may need to take more than one. Work closely with your health care provider to understand the risks and benefits. HOME CARE INSTRUCTIONS  Have your blood pressure rechecked as directed by your health care provider.   Take medicines only as directed by your health care provider. Follow the directions carefully. Blood pressure medicines must be taken as prescribed. The medicine does not work as well when you skip doses. Skipping doses also puts you at risk for problems.   Do not smoke.   Monitor your blood pressure at home as directed by your health care provider. SEEK MEDICAL CARE IF:   You think you are having a reaction to medicines taken.  You have recurrent headaches or feel dizzy.  You have swelling in your ankles.  You have trouble with your vision. SEEK IMMEDIATE MEDICAL CARE IF:  You develop a severe headache or confusion.  You have unusual weakness,  numbness, or feel faint.  You have severe chest or abdominal pain.  You vomit repeatedly.  You have trouble breathing. MAKE SURE YOU:   Understand these instructions.  Will watch your condition.  Will get help right away if you are not doing well or get worse. Document Released: 12/05/2005 Document Revised: 04/21/2014 Document Reviewed: 09/27/2013 [employer] Patient Information 2015 [employer]. This information is not intended to replace advice given to you by your health care provider. Make sure you discuss any questions you have with your health care provider.  

## 2014-10-27 NOTE — Assessment & Plan Note (Signed)
I will recheck her CBC and iron levels today I have asked her to take of the menorrhagia She does not tolerate oral iron very well so will refer to Hematology to consider iron infusions

## 2014-10-27 NOTE — Progress Notes (Signed)
   Subjective:    Patient ID: Haley Harding, female    DOB: [birth year], 48 y.o.   MRN: [id number]  Hypertension This is a chronic problem. The current episode started more than 1 year ago. The problem has been gradually worsening since onset. The problem is uncontrolled. Associated symptoms include headaches. Pertinent negatives include no anxiety, blurred vision, chest pain, malaise/fatigue, neck pain, orthopnea, palpitations, peripheral edema, PND, shortness of breath or sweats. Past treatments include beta blockers and diuretics. The current treatment provides mild improvement. Compliance problems include diet, exercise, psychosocial issues and medication side effects (she is convinced that coreg causes HA's).       Review of Systems  Constitutional: Negative.  Negative for fever, chills, malaise/fatigue, diaphoresis, appetite change and fatigue.  Eyes: Negative.  Negative for blurred vision.  Respiratory: Negative.  Negative for cough, choking, chest tightness and shortness of breath.   Cardiovascular: Negative.  Negative for chest pain, palpitations, orthopnea, leg swelling and PND.  Gastrointestinal: Negative.  Negative for nausea, vomiting, abdominal pain, diarrhea, constipation and blood in stool.  Endocrine: Negative.   Genitourinary: Positive for vaginal bleeding and menstrual problem.  Musculoskeletal: Negative.  Negative for back pain, joint swelling, arthralgias and neck pain.  Skin: Negative.  Negative for rash.  Allergic/Immunologic: Negative.   Neurological: Positive for headaches. Negative for dizziness, tremors, seizures, syncope, facial asymmetry, speech difficulty, weakness, light-headedness and numbness.  Hematological: Negative.  Negative for adenopathy. Does not bruise/bleed easily.  Psychiatric/Behavioral: Negative.        Objective:   Physical Exam  Constitutional: She is oriented to person, place, and time. She appears well-developed and well-nourished. No  distress.  HENT:  Head: Normocephalic and atraumatic.  Mouth/Throat: Oropharynx is clear and moist. No oropharyngeal exudate.  Eyes: Conjunctivae are normal. Right eye exhibits no discharge. Left eye exhibits no discharge. No scleral icterus.  Neck: Normal range of motion. Neck supple. No JVD present. No tracheal deviation present. No thyromegaly present.  Cardiovascular: Normal rate, regular rhythm, normal heart sounds and intact distal pulses.  Exam reveals no gallop and no friction rub.   No murmur heard. Pulmonary/Chest: Effort normal and breath sounds normal. No stridor. No respiratory distress. She has no wheezes. She has no rales. She exhibits no tenderness.  Abdominal: Soft. Bowel sounds are normal. She exhibits no distension and no mass. There is no tenderness. There is no rebound and no guarding.  Musculoskeletal: Normal range of motion. She exhibits no edema or tenderness.  Lymphadenopathy:    She has no cervical adenopathy.  Neurological: She is oriented to person, place, and time.  Skin: Skin is warm and dry. No rash noted. She is not diaphoretic. No erythema. No pallor.  Psychiatric: She has a normal mood and affect. Her behavior is normal. Judgment and thought content normal.  Vitals reviewed.    Lab Results  Component Value Date   WBC 6.7 01/22/2014   HGB 11.8* 01/22/2014   HCT 36.2 01/22/2014   PLT 454* 01/22/2014   GLUCOSE 88 01/22/2014   CHOL 169 07/27/2012   TRIG 28.0 07/27/2012   HDL 69.20 07/27/2012   LDLCALC 94 07/27/2012   ALT 9 01/22/2014   AST 13 01/22/2014   NA 139 01/22/2014   K 3.7 01/22/2014   CL 101 01/22/2014   CREATININE 0.67 01/22/2014   BUN 8 01/22/2014   CO2 25 01/22/2014   TSH 0.75 06/12/2013       Assessment & Plan:

## 2014-10-27 NOTE — Assessment & Plan Note (Signed)
Exam done She was referred for a mammogram Labs ordered Vaccines were addressed Pt ed material was given

## 2014-10-28 ENCOUNTER — Telehealth: Payer: Self-pay | Admitting: Hematology and Oncology

## 2014-10-28 NOTE — Telephone Encounter (Signed)
LEFT MESSAGE FOR PATIENT AND [facility] NP APPT FOR 11/19 @ 1:15 W/DR. Maitland. CONTACT INFORMATION FOR PATIENT TO RETURN CALL TO CONFIRM MESSAGE/NP APPT.

## 2014-11-06 ENCOUNTER — Ambulatory Visit: Payer: 59

## 2014-11-06 ENCOUNTER — Ambulatory Visit: Payer: 59 | Admitting: Hematology and Oncology

## 2015-08-20 DIAGNOSIS — R079 Chest pain, unspecified: Secondary | ICD-10-CM

## 2015-08-20 HISTORY — DX: Chest pain, unspecified: R07.9

## 2015-09-04 ENCOUNTER — Emergency Department (HOSPITAL_COMMUNITY): Payer: 59

## 2015-09-04 ENCOUNTER — Encounter (HOSPITAL_COMMUNITY): Payer: Self-pay | Admitting: *Deleted

## 2015-09-04 ENCOUNTER — Emergency Department (HOSPITAL_COMMUNITY)
Admission: EM | Admit: 2015-09-04 | Discharge: 2015-09-04 | Disposition: A | Discharge status: Home / Self Care | Payer: 59 | Attending: Emergency Medicine | Admitting: Emergency Medicine

## 2015-09-04 DIAGNOSIS — F329 Major depressive disorder, single episode, unspecified: Secondary | ICD-10-CM | POA: Diagnosis not present

## 2015-09-04 DIAGNOSIS — R079 Chest pain, unspecified: Secondary | ICD-10-CM | POA: Diagnosis present

## 2015-09-04 DIAGNOSIS — Z79899 Other long term (current) drug therapy: Secondary | ICD-10-CM | POA: Insufficient documentation

## 2015-09-04 DIAGNOSIS — I10 Essential (primary) hypertension: Secondary | ICD-10-CM | POA: Diagnosis not present

## 2015-09-04 DIAGNOSIS — D509 Iron deficiency anemia, unspecified: Secondary | ICD-10-CM | POA: Diagnosis not present

## 2015-09-04 LAB — BASIC METABOLIC PANEL
ANION GAP: 8 (ref 5–15)
BUN: 7 mg/dL (ref 6–20)
CHLORIDE: 104 mmol/L (ref 101–111)
CO2: 25 mmol/L (ref 22–32)
Calcium: 9.1 mg/dL (ref 8.9–10.3)
Creatinine, Ser: 0.71 mg/dL (ref 0.44–1.00)
GFR calc non Af Amer: >60 mL/min (ref >60)
Glucose, Bld: 105 mg/dL — ABNORMAL HIGH (ref 65–99)
POTASSIUM: 3.2 mmol/L — AB (ref 3.5–5.1)
Sodium: 137 mmol/L (ref 135–145)

## 2015-09-04 LAB — CBC
HEMATOCRIT: 34.4 % — AB (ref 36.0–46.0)
HEMOGLOBIN: 11.2 g/dL — AB (ref 12.0–15.0)
MCH: 26.2 pg (ref 26.0–34.0)
MCHC: 32.6 g/dL (ref 30.0–36.0)
MCV: 80.4 fL (ref 78.0–100.0)
Platelets: 504 10*3/uL — ABNORMAL HIGH (ref 150–400)
RBC: 4.28 MIL/uL (ref 3.87–5.11)
RDW: 18.4 % — ABNORMAL HIGH (ref 11.5–15.5)
WBC: 7.5 10*3/uL (ref 4.0–10.5)

## 2015-09-04 LAB — I-STAT TROPONIN, ED: Troponin i, poc: 0 ng/mL (ref 0.00–0.08)

## 2015-09-04 MED ORDER — ASPIRIN 81 MG PO CHEW
324.0000 mg | CHEWABLE_TABLET | Freq: Once | ORAL | Status: AC
  Administered 2015-09-04: 324 mg via ORAL
  Filled 2015-09-04: qty 4

## 2015-09-04 MED ORDER — OMEPRAZOLE 20 MG PO CPDR: 20.0000 mg | DELAYED_RELEASE_CAPSULE | Freq: Every day | ORAL | Status: DC

## 2015-09-04 NOTE — ED Provider Notes (Signed)
CSN: [id number]     Arrival date & time 09/04/15  1624 History   First MD Initiated Contact with Patient 09/04/15 1628     Chief Complaint  Patient presents with  . Chest Pain     (Consider location/radiation/quality/duration/timing/severity/associated sxs/prior Treatment) HPI  Haley Harding Is a 49 year old female who presents the emergency department with chief complaint of left-sided chest pain. The patient had several days of heart palpitations prior to onset of her chest pain 3 days ago. Patient states that she's had left-sided chest pain which has been constant, sharp. She has intermittent left arm pain. She states it has not gone away at any time. She has been able to sleep. She denies any nausea, diaphoresis, or exertional chest pain. Patient states that today she was eating once her pain became more intense and she felt as if she is going to pass out. She does have a history of mitral valve prolapse and is followed by Dr. Bjorn Pippin. The patient denies any new medications, stimulant use. She denies any pleuritic chest pain, unilateral leg swelling, history of DVT or PE. She denies any exogenous estrogen use. Patient has no recent heavy lifting. Cardiac risk factors include being overweight, and well-controlled hypertension. She denies a history of smoking, diabetes, hyperlipidemia or family history of MI or coronary artery disease. Past Medical History  Diagnosis Date  . Depression   . Herpes genitalia   . Anemia   . Hypertension   . Uterine cyst   . Menorrhagia   . Iron deficiency anemia due to chronic blood loss   . Mass of uterus 10/07/2013   Past Surgical History  Procedure Laterality Date  . Tubal ligation    . Esophagogastroduodenoscopy N/A 01/24/2014    Procedure: ESOPHAGOGASTRODUODENOSCOPY (EGD);  Surgeon: Jerene Bears, MD;  Location: [facility];  Service: Gastroenterology;  Laterality: N/A;   Family History  Problem Relation Age of Onset  . Hypertension Mother    . Hypertension Father   . Diabetes Father   . Cancer Neg Hx   . Alcohol abuse Neg Hx   . Early death Neg Hx   . Hearing loss Neg Hx   . Heart disease Neg Hx   . Hyperlipidemia Neg Hx   . Kidney disease Neg Hx   . Stroke Neg Hx    Social History  Substance Use Topics  . Smoking status: Never Smoker   . Smokeless tobacco: Never Used  . Alcohol Use: No   OB History    No data available     Review of Systems  Ten systems reviewed and are negative for acute change, except as noted in the HPI.    Allergies  Review of patient's allergies indicates no active allergies.  Home Medications   Prior to Admission medications   Medication Sig Start Date End Date Taking? Authorizing Provider  amLODipine (NORVASC) 5 MG tablet Take 1 tablet (5 mg total) by mouth daily. 10/27/14  Yes Janith Lima, MD  diphenhydrAMINE (BENADRYL) 25 MG tablet Take 25 mg by mouth every 6 (six) hours as needed for sleep.   Yes Historical Provider, MD  ferrous sulfate 325 (65 FE) MG EC tablet Take 325 mg by mouth 3 (three) times daily with meals.   Yes Historical Provider, MD  hydrochlorothiazide (MICROZIDE) 12.5 MG capsule Take 1 capsule (12.5 mg total) by mouth daily. 05/13/14  Yes Janith Lima, MD  acyclovir (ZOVIRAX) 800 MG tablet TAKE 1 TABLET BY MOUTH THREE TIMES A DAY  01/31/14   Janith Lima, MD  carvedilol (COREG) 12.5 MG tablet Take 1 tablet (12.5 mg total) by mouth 2 (two) times daily with a meal. Patient not taking: Reported on 09/04/2015 05/13/14   Janith Lima, MD  DULoxetine (CYMBALTA) 60 MG capsule TAKE 1 CAPSULE BY MOUTH DAILY. 02/20/14   Janith Lima, MD   BP 121/62 mmHg  Pulse 76  Temp(Src) 98.1 F (36.7 C) (Oral)  Resp 19  Ht 5\' 8"  (1.727 m)  Wt 176 lb (79.833 kg)  BMI 26.77 kg/m2  SpO2 98% Physical Exam  Constitutional: She is oriented to person, place, and time. She appears well-developed and well-nourished. No distress.  HENT:  Head: Normocephalic and atraumatic.  Eyes:  Conjunctivae are normal. No scleral icterus.  Neck: Normal range of motion.  Cardiovascular: Normal rate, regular rhythm and normal heart sounds.  Exam reveals no gallop and no friction rub.   No murmur heard. Pulmonary/Chest: Effort normal and breath sounds normal. No respiratory distress. She exhibits tenderness.    Patient with large pendulous breasts. She is exquisitely tender to palpation along the sternal border bilaterally with palpation to the pectoralis.  Abdominal: Soft. Bowel sounds are normal. She exhibits no distension and no mass. There is no tenderness. There is no guarding.  Neurological: She is alert and oriented to person, place, and time.  Skin: Skin is warm and dry. She is not diaphoretic.  Nursing note and vitals reviewed.   ED Course  Procedures (including critical care time) Labs Review Labs Reviewed  BASIC METABOLIC PANEL - Abnormal; Notable for the following:    Potassium 3.2 (*)    Glucose, Bld 105 (*)    All other components within normal limits  CBC - Abnormal; Notable for the following:    Hemoglobin 11.2 (*)    HCT 34.4 (*)    RDW 18.4 (*)    Platelets 504 (*)    All other components within normal limits  I-STAT TROPOININ, ED    Imaging Review Dg Chest 2 View  09/04/2015   CLINICAL DATA:  Left side chest pain  EXAM: CHEST  2 VIEW  COMPARISON:  10/07/2011  FINDINGS: Cardiomediastinal silhouette is stable. No acute infiltrate or pleural effusion. No pulmonary edema. Bony thorax is unremarkable.  IMPRESSION: No active cardiopulmonary disease.   Electronically Signed   By: Lahoma Crocker M.D.   On: 09/04/2015 17:21   I have personally reviewed and evaluated these images and lab results as part of my medical decision-making.   EKG Interpretation   Date/Time:  Friday September 04 2015 16:26:37 EDT Ventricular Rate:  76 PR Interval:  134 QRS Duration: 83 QT Interval:  395 QTC Calculation: 444 R Axis:   60 Text Interpretation:  Sinus rhythm Abnormal  R-wave progression, early  transition no change from previous. Confirmed by Johnney Killian, MD, Jeannie Done  [phone]) on 09/04/2015 4:31:30 PM      MDM   Final diagnoses:  None    5:54 PM BP 121/62 mmHg  Pulse 76  Temp(Src) 98.1 F (36.7 C) (Oral)  Resp 19  Ht 5\' 8"  (1.727 m)  Wt 176 lb (79.833 kg)  BMI 26.77 kg/m2  SpO2 98% Patient with normal ekg, no changes, normal chest x-ray. Constant cp for >48 hours with negative troponin. Heart score is 2 with minimal RF. Perc negative. I believe this is likely chest wall pain. She may follow up with Dr. Angelena Form for cardiac stress test. Discussed findings with the patient. She understands and  agress with POC. Return precautions discussed.    Margarita Mail, PA-C 09/05/15 0144  Charlesetta Shanks, MD 09/05/15 2149

## 2015-09-04 NOTE — ED Notes (Signed)
Pt satrted to experience left sided chest pain on Wednesday that gotten worse today. Describes pain as a sharp constant pain.  Pt was given 324mg  of ASA in route and 3 nitro. Nitro brought the pain from a 7 to a 1 out of 10 pain.  VS are as follows: 133/84 HR:82 SPO2: 100% on RA Resp: 24 CBG:100

## 2015-09-04 NOTE — Discharge Instructions (Signed)
Follow up as soon as possible with your cardiologist for an ER follow up. You may need a cardiac stress test. Your caregiver has diagnosed you as having chest pain that is not specific for one problem, but does not require admission.  You are at low risk for an acute heart condition or other serious illness. Chest pain comes from many different causes.  SEEK IMMEDIATE MEDICAL ATTENTION IF: You have severe chest pain, especially if the pain is crushing or pressure-like and spreads to the arms, back, neck, or jaw, or if you have sweating, nausea (feeling sick to your stomach), or shortness of breath. THIS IS AN EMERGENCY. Don't wait to see if the pain will go away. Get medical help at once. Call 911 or 0 (operator). DO NOT drive yourself to the hospital.  Your chest pain gets worse and does not go away with rest.  You have an attack of chest pain lasting longer than usual, despite rest and treatment with the medications your caregiver has prescribed.  You wake from sleep with chest pain or shortness of breath.  You feel dizzy or faint.  You have chest pain not typical of your usual pain for which you originally saw your caregiver.  Chest Wall Pain Chest wall pain is pain in or around the bones and muscles of your chest. It may take up to 6 weeks to get better. It may take longer if you must stay physically active in your work and activities.  CAUSES  Chest wall pain may happen on its own. However, it may be caused by:  A viral illness like the flu.  Injury.  Coughing.  Exercise.  Arthritis.  Fibromyalgia.  Shingles. HOME CARE INSTRUCTIONS   Avoid overtiring physical activity. Try not to strain or perform activities that cause pain. This includes any activities using your chest or your abdominal and side muscles, especially if heavy weights are used.  Put ice on the sore area.  Put ice in a plastic bag.  Place a towel between your skin and the bag.  Leave the ice on for 15-20  minutes per hour while awake for the first 2 days.  Only take over-the-counter or prescription medicines for pain, discomfort, or fever as directed by your caregiver. SEEK IMMEDIATE MEDICAL CARE IF:   Your pain increases, or you are very uncomfortable.  You have a fever.  Your chest pain becomes worse.  You have new, unexplained symptoms.  You have nausea or vomiting.  You feel sweaty or lightheaded.  You have a cough with phlegm (sputum), or you cough up blood. MAKE SURE YOU:   Understand these instructions.  Will watch your condition.  Will get help right away if you are not doing well or get worse. Document Released: 12/05/2005 Document Revised: 02/27/2012 Document Reviewed: 08/01/2011 [facility] Patient Information 2015 [place], [place]. This information is not intended to replace advice given to you by your health care provider. Make sure you discuss any questions you have with your health care provider.

## 2015-09-04 NOTE — ED Notes (Signed)
NAD at this time. Pt is stable and going home.  

## 2015-09-04 NOTE — ED Notes (Signed)
Pt leaving for XRay. 

## 2015-09-07 ENCOUNTER — Telehealth: Payer: Self-pay | Admitting: Cardiovascular Disease

## 2015-09-07 ENCOUNTER — Telehealth: Payer: Self-pay | Admitting: Physician Assistant

## 2015-09-07 ENCOUNTER — Ambulatory Visit (INDEPENDENT_AMBULATORY_CARE_PROVIDER_SITE_OTHER): Payer: 59 | Admitting: Physician Assistant

## 2015-09-07 ENCOUNTER — Encounter: Payer: Self-pay | Admitting: Physician Assistant

## 2015-09-07 VITALS — BP 122/76 | HR 82 | Ht 68.0 in | Wt 178.0 lb

## 2015-09-07 DIAGNOSIS — R42 Dizziness and giddiness: Secondary | ICD-10-CM | POA: Diagnosis not present

## 2015-09-07 DIAGNOSIS — I10 Essential (primary) hypertension: Secondary | ICD-10-CM | POA: Diagnosis not present

## 2015-09-07 DIAGNOSIS — E876 Hypokalemia: Secondary | ICD-10-CM

## 2015-09-07 DIAGNOSIS — R079 Chest pain, unspecified: Secondary | ICD-10-CM

## 2015-09-07 DIAGNOSIS — R7989 Other specified abnormal findings of blood chemistry: Secondary | ICD-10-CM

## 2015-09-07 DIAGNOSIS — R791 Abnormal coagulation profile: Secondary | ICD-10-CM

## 2015-09-07 DIAGNOSIS — R002 Palpitations: Secondary | ICD-10-CM

## 2015-09-07 LAB — D-DIMER, QUANTITATIVE (NOT AT ARMC): D DIMER QUANT: 0.57 ug{FEU}/mL — AB (ref 0.00–0.48)

## 2015-09-07 MED ORDER — POTASSIUM CHLORIDE CRYS ER 20 MEQ PO TBCR: 20.0000 meq | EXTENDED_RELEASE_TABLET | Freq: Every day | ORAL | Status: DC

## 2015-09-07 NOTE — Telephone Encounter (Signed)
Left message to call back  

## 2015-09-07 NOTE — Patient Instructions (Signed)
Medication Instructions:  1. START POTASSIUM 20 MEQ DAILY; RX  Labwork: 1. TODAY D-DIMER  2. 1 WEEK BMET, TSH, MAG LEVEL  Testing/Procedures: 1. Your physician has requested that you have an echocardiogram. Echocardiography is a painless test that uses sound waves to create images of your heart. It provides your doctor with information about the size and shape of your heart and how well your heart's chambers and valves are working. This procedure takes approximately one hour. There are no restrictions for this procedure.  2. Your physician has requested that you have an exercise tolerance test. For further information please visit [email or web address]. Please also follow instruction sheet, as given.   Follow-Up: 1 MONTH WITH DR. Angelena Form   Any Other Special Instructions Will Be Listed Below (If Applicable).

## 2015-09-07 NOTE — Progress Notes (Signed)
Cardiology Office Note   Date:  09/07/2015   ID:  Haley Harding, DOB [birth year], MRN [id number]  PCP:  Scarlette Calico, MD  Cardiologist:  Dr. Lauree Chandler   Electrophysiologist:  n/a  Chief Complaint  Patient presents with  . Chest Pain  . Dizziness     History of Present Illness: Haley Harding is a 49 y.o. female with a hx of HTN, depression.  Initially seen by Dr. Lauree Chandler in 2012 for evaluation of chest pain. She had been doing gymnastics with her daughter and began to feel her heart racing and she felt dyspneic. She then felt a discomfort in her chest and left arm. She went into the ED at [facility]. Her cardiac enzymes were negative. Her d-dimer was elevated. CTA chest was negative for PE or other thoracic disease. She started a stress echo in the ED but she became frustrated and left AMA before the images and stress test were completed. She noted daily palpitations and Holter monitor demonstrated PVCs, PACs.  Echo showed normal LV size and function and mod TR.  Last seen by Dr. Lauree Chandler in 1/14.  Last echo in 7/14 only showed mild TR.   She was seen in the ED 9/16 with chest pain.  K+ 3.2, Cr 0.71, Hgb 11.2, Trop neg.  CXR was unremarkable.  She is added on for FU evaluation.    The patient had been in her usual state of health until last week. She started having more palpitations and then started having fairly constant chest discomfort. This was left-sided and described as sharp.  It continued throughout the day at work on Friday and then she started to get dizzy. She felt near syncopal and was helped to the ground. EMS was called. She was given nitroglycerin and aspirin. She has no indication for bradycardia or hypotension on the records reviewed from the emergency room. She did have some left arm symptoms but denies any other associated dyspnea, nausea, diaphoresis. She denies frank syncope. She denies orthopnea, PND or edema. She has noted  improved chest discomfort since last week. It comes and goes now and only lasts a short period of time. She denies exertional chest pain. She is NYHA 1-2. She did go to urgent care yesterday with lightheadedness and another episode of near syncope. She was given meclizine which does help her dizziness.   Studies/Reports Reviewed Today:  Echo 7/14 EF 60% to 65%.  Holter 1/13 NSR, PACs, PVCs No AFib  Past Medical History  Diagnosis Date  . Depression   . Herpes genitalia   . Anemia   . Hypertension   . Uterine cyst   . Menorrhagia   . Iron deficiency anemia due to chronic blood loss   . Mass of uterus 10/07/2013    Past Surgical History  Procedure Laterality Date  . Tubal ligation    . Esophagogastroduodenoscopy N/A 01/24/2014    Procedure: ESOPHAGOGASTRODUODENOSCOPY (EGD);  Surgeon: Jerene Bears, MD;  Location: [facility];  Service: Gastroenterology;  Laterality: N/A;     Current Outpatient Prescriptions  Medication Sig Dispense Refill  . acyclovir (ZOVIRAX) 800 MG tablet TAKE 1 TABLET BY MOUTH THREE TIMES A DAY 30 tablet 5  . amLODipine (NORVASC) 5 MG tablet Take 1 tablet (5 mg total) by mouth daily. 90 tablet 3  . diphenhydrAMINE (BENADRYL) 25 MG tablet Take 25 mg by mouth every 6 (six) hours as needed for sleep.    . DULoxetine (CYMBALTA) 60 MG capsule  TAKE 1 CAPSULE BY MOUTH DAILY. 90 capsule 3  . ferrous sulfate 325 (65 FE) MG EC tablet Take 325 mg by mouth 3 (three) times daily with meals.    . hydrochlorothiazide (MICROZIDE) 12.5 MG capsule Take 1 capsule (12.5 mg total) by mouth daily. 90 capsule 4  . omeprazole (PRILOSEC) 20 MG capsule Take 1 capsule (20 mg total) by mouth daily. 30 capsule 1  . meclizine (ANTIVERT) 12.5 MG tablet Take 12.5 mg by mouth 3 (three) times daily as needed for dizziness.    . potassium chloride SA (K-DUR,KLOR-CON) 20 MEQ tablet Take 1 tablet (20 mEq total) by mouth daily. 90 tablet 3   No current facility-administered medications for  this visit.    Allergies:   Review of patient's allergies indicates no active allergies.    Social History:  The patient  reports that she has never smoked. She has never used smokeless tobacco. She reports that she does not drink alcohol or use illicit drugs.   Family History:  The patient's family history includes Diabetes in her father; Hypertension in her father and mother. There is no history of Cancer, Alcohol abuse, Early death, Hearing loss, Heart disease, Hyperlipidemia, Kidney disease, or Stroke.    ROS:   Please see the history of present illness.   Review of Systems  Neurological: Positive for dizziness.  All other systems reviewed and are negative.     PHYSICAL EXAM: VS:  BP 122/76 mmHg  Pulse 82  Ht 5\' 8"  (1.727 m)  Wt 178 lb (80.74 kg)  BMI 27.07 kg/m2    Wt Readings from Last 3 Encounters:  09/07/15 178 lb (80.74 kg)  09/04/15 176 lb (79.833 kg)  10/27/14 182 lb (82.555 kg)     GEN: Well nourished, well developed, in no acute distress HEENT: normal Neck: no JVD, no carotid bruits, no masses Cardiac:  Normal S1/S2, RRR; no murmur ,  no rubs or gallops, no edema   Respiratory:  clear to auscultation bilaterally, no wheezing, rhonchi or rales. GI: soft, nontender, nondistended, + BS MS: no deformity or atrophy Skin: warm and dry  Neuro:  CNs II-XII intact, Strength and sensation are intact Psych: Normal affect   EKG:  EKG is ordered today.  It demonstrates:   NSR, HR 82, normal axis, NSSTTW changes.   Recent Labs: 10/27/2014: ALT 13; TSH 0.56 09/04/2015: BUN 7; Creatinine, Ser 0.71; Hemoglobin 11.2*; Platelets 504*; Potassium 3.2*; Sodium 137    Lipid Panel    Component Value Date/Time   CHOL 158 10/27/2014 1117   TRIG 57.0 10/27/2014 1117   HDL 38.60* 10/27/2014 1117   CHOLHDL 4 10/27/2014 1117   VLDL 11.4 10/27/2014 1117   LDLCALC 108* 10/27/2014 1117      ASSESSMENT AND PLAN:  1. Chest pain: Etiology not clear. Symptoms are fairly  atypical. ECG is normal. Cardiac enzymes in the emergency room were normal after several days of chest discomfort. Risks for pulmonary embolism are quite low. I will just check a d-dimer. If abnormal, proceed with chest CT. Arrange exercise treadmill test to rule out ischemia.  Check Echo.   2. Dizziness: Etiology not entirely clear. She's been taking meclizine with some relief. I suspect a lot of her symptoms may be related to increased palpitations from hypokalemia. In review of her records, I do not see a need to proceed with event monitoring at this point. Obtain echocardiogram. Obtain ETT as noted above.  3. Hypokalemia: Question if a lot of her symptoms  may be related to this. Start potassium 20 mEq daily. Arrange follow-up BMET, magnesium, TSH in one week.  4. HTN:  Controlled.  5. Palpitations:  Probably exacerbated by hypoK+.  Replace K+.  Check TSH.     Medication Changes: Current medicines are reviewed at length with the patient today.  Concerns regarding medicines are as outlined above.  The following changes have been made:   Discontinued Medications   CARVEDILOL (COREG) 12.5 MG TABLET    Take 1 tablet (12.5 mg total) by mouth 2 (two) times daily with a meal.   Modified Medications   No medications on file   New Prescriptions   POTASSIUM CHLORIDE SA (K-DUR,KLOR-CON) 20 MEQ TABLET    Take 1 tablet (20 mEq total) by mouth daily.    Labs/ tests ordered today include:   Orders Placed This Encounter  Procedures  . D-Dimer, Quantitative  . Basic Metabolic Panel (BMET)  . Magnesium  . TSH  . Exercise Tolerance Test  . EKG 12-Lead  . Echocardiogram      Disposition:    FU with me or Dr. Lauree Chandler 1 month.    Signed, Versie Starks, MHS 09/07/2015 5:27 PM    [facility] [place], [place], [place]  [address] Phone: [phone]; Fax: [phone]

## 2015-09-07 NOTE — Telephone Encounter (Signed)
New Message  Pt wants to be seen sooner than what Dr. Angelena Form has   Pt has appt with Nicki Reaper on 10/3  The ER notes did not state if pt needs to be seen   urgently for Flex, pt didn't complain of pain or symptoms

## 2015-09-07 NOTE — Telephone Encounter (Signed)
Spoke with pt. She states she was seen in ED recently and told she needed sooner appt with cardiology than previously scheduled 09/21/15 appt.  Appt made for pt to see Richardson Dopp, PA today at 2:40

## 2015-09-07 NOTE — Telephone Encounter (Signed)
Patient D-dimer came as minimally elevated 0.57. Case discussed with Dr. Sallyanne Kuster. Patient does not having any chest pain or sob.   Plan: office will contact patient in morning regarding CT chest follow up. Patient will call office if she does not hear anything from early tomorrow. If she develops CP or SOB overnight, she will go to ER for further evaluation. Patient aggress with plan.  Bhagat,Bhavinkumar, [place]

## 2015-09-08 ENCOUNTER — Telehealth: Payer: Self-pay | Admitting: *Deleted

## 2015-09-08 ENCOUNTER — Ambulatory Visit (INDEPENDENT_AMBULATORY_CARE_PROVIDER_SITE_OTHER)
Admission: RE | Admit: 2015-09-08 | Discharge: 2015-09-08 | Disposition: A | Discharge status: Home / Self Care | Payer: 59 | Source: Ambulatory Visit | Attending: Physician Assistant | Admitting: Physician Assistant

## 2015-09-08 ENCOUNTER — Encounter: Payer: Self-pay | Admitting: Physician Assistant

## 2015-09-08 ENCOUNTER — Ambulatory Visit (INDEPENDENT_AMBULATORY_CARE_PROVIDER_SITE_OTHER): Payer: 59 | Admitting: Physician Assistant

## 2015-09-08 ENCOUNTER — Observation Stay (HOSPITAL_COMMUNITY)
Admission: AD | Admit: 2015-09-08 | Discharge: 2015-09-09 | Disposition: A | Discharge status: Home / Self Care | Payer: 59 | Source: Ambulatory Visit | Attending: Internal Medicine | Admitting: Internal Medicine

## 2015-09-08 VITALS — BP 140/70 | HR 76 | Resp 12 | Ht 68.0 in | Wt 178.0 lb

## 2015-09-08 DIAGNOSIS — F411 Generalized anxiety disorder: Secondary | ICD-10-CM | POA: Diagnosis present

## 2015-09-08 DIAGNOSIS — F419 Anxiety disorder, unspecified: Secondary | ICD-10-CM | POA: Insufficient documentation

## 2015-09-08 DIAGNOSIS — R42 Dizziness and giddiness: Secondary | ICD-10-CM

## 2015-09-08 DIAGNOSIS — R079 Chest pain, unspecified: Secondary | ICD-10-CM | POA: Diagnosis present

## 2015-09-08 DIAGNOSIS — R791 Abnormal coagulation profile: Secondary | ICD-10-CM

## 2015-09-08 DIAGNOSIS — R7989 Other specified abnormal findings of blood chemistry: Secondary | ICD-10-CM

## 2015-09-08 DIAGNOSIS — E876 Hypokalemia: Secondary | ICD-10-CM | POA: Diagnosis not present

## 2015-09-08 DIAGNOSIS — I10 Essential (primary) hypertension: Secondary | ICD-10-CM | POA: Diagnosis present

## 2015-09-08 DIAGNOSIS — F329 Major depressive disorder, single episode, unspecified: Secondary | ICD-10-CM | POA: Diagnosis not present

## 2015-09-08 DIAGNOSIS — F32A Depression, unspecified: Secondary | ICD-10-CM | POA: Diagnosis present

## 2015-09-08 DIAGNOSIS — R0781 Pleurodynia: Secondary | ICD-10-CM

## 2015-09-08 HISTORY — DX: Chest pain, unspecified: R07.9

## 2015-09-08 HISTORY — DX: Anxiety disorder, unspecified: F41.9

## 2015-09-08 LAB — CBC WITH DIFFERENTIAL/PLATELET
Basophils Absolute: 0.1 10*3/uL (ref 0.0–0.1)
Basophils Relative: 1 %
EOS ABS: 0.1 10*3/uL (ref 0.0–0.7)
EOS PCT: 1 %
HCT: 39.5 % (ref 36.0–46.0)
HEMOGLOBIN: 12.7 g/dL (ref 12.0–15.0)
LYMPHS ABS: 1.8 10*3/uL (ref 0.7–4.0)
LYMPHS PCT: 23 %
MCH: 26.5 pg (ref 26.0–34.0)
MCHC: 32.2 g/dL (ref 30.0–36.0)
MCV: 82.3 fL (ref 78.0–100.0)
MONOS PCT: 5 %
Monocytes Absolute: 0.4 10*3/uL (ref 0.1–1.0)
NEUTROS PCT: 70 %
Neutro Abs: 5.5 10*3/uL (ref 1.7–7.7)
Platelets: 559 10*3/uL — ABNORMAL HIGH (ref 150–400)
RBC: 4.8 MIL/uL (ref 3.87–5.11)
RDW: 18.8 % — ABNORMAL HIGH (ref 11.5–15.5)
WBC: 7.9 10*3/uL (ref 4.0–10.5)

## 2015-09-08 LAB — BASIC METABOLIC PANEL
Anion gap: 8 (ref 5–15)
BUN: 7 mg/dL (ref 6–20)
CALCIUM: 9.6 mg/dL (ref 8.9–10.3)
CHLORIDE: 101 mmol/L (ref 101–111)
CO2: 30 mmol/L (ref 22–32)
CREATININE: 0.69 mg/dL (ref 0.44–1.00)
GFR calc non Af Amer: >60 mL/min (ref >60)
GLUCOSE: 105 mg/dL — AB (ref 65–99)
Potassium: 3.3 mmol/L — ABNORMAL LOW (ref 3.5–5.1)
Sodium: 139 mmol/L (ref 135–145)

## 2015-09-08 LAB — TROPONIN I: Troponin I: <0.03 ng/mL (ref <0.031)

## 2015-09-08 LAB — GLUCOSE, CAPILLARY: Glucose-Capillary: 118 mg/dL — ABNORMAL HIGH (ref 65–99)

## 2015-09-08 LAB — TSH: TSH: 1.009 u[IU]/mL (ref 0.350–4.500)

## 2015-09-08 MED ORDER — FERROUS SULFATE 325 (65 FE) MG PO TABS: 325.0000 mg | ORAL_TABLET | Freq: Three times a day (TID) | ORAL | Status: DC

## 2015-09-08 MED ORDER — ACYCLOVIR 800 MG PO TABS: 800.0000 mg | ORAL_TABLET | Freq: Three times a day (TID) | ORAL | Status: DC

## 2015-09-08 MED ORDER — ASPIRIN 300 MG RE SUPP
300.0000 mg | RECTAL | Status: DC
  Filled 2015-09-08: qty 1

## 2015-09-08 MED ORDER — AMLODIPINE BESYLATE 5 MG PO TABS
5.0000 mg | ORAL_TABLET | Freq: Every day | ORAL | Status: DC
  Administered 2015-09-09: 5 mg via ORAL
  Filled 2015-09-08: qty 1

## 2015-09-08 MED ORDER — HYDROCHLOROTHIAZIDE 12.5 MG PO CAPS
12.5000 mg | ORAL_CAPSULE | Freq: Every day | ORAL | Status: DC
  Administered 2015-09-09: 12.5 mg via ORAL
  Filled 2015-09-08: qty 1

## 2015-09-08 MED ORDER — SODIUM CHLORIDE 0.9 % IJ SOLN
3.0000 mL | Freq: Two times a day (BID) | INTRAMUSCULAR | Status: DC
  Administered 2015-09-08 – 2015-09-09 (×2): 3 mL via INTRAVENOUS

## 2015-09-08 MED ORDER — SODIUM CHLORIDE 0.9 % IV SOLN: 250.0000 mL | INTRAVENOUS | Status: DC | PRN

## 2015-09-08 MED ORDER — ASPIRIN 81 MG PO CHEW: 324.0000 mg | CHEWABLE_TABLET | ORAL | Status: DC

## 2015-09-08 MED ORDER — POTASSIUM CHLORIDE CRYS ER 20 MEQ PO TBCR
20.0000 meq | EXTENDED_RELEASE_TABLET | Freq: Every day | ORAL | Status: DC
  Administered 2015-09-09: 20 meq via ORAL
  Filled 2015-09-08: qty 1

## 2015-09-08 MED ORDER — MECLIZINE HCL 25 MG PO TABS: 12.5000 mg | ORAL_TABLET | Freq: Three times a day (TID) | ORAL | Status: DC | PRN

## 2015-09-08 MED ORDER — ASPIRIN EC 81 MG PO TBEC
81.0000 mg | DELAYED_RELEASE_TABLET | Freq: Every day | ORAL | Status: DC
  Filled 2015-09-08: qty 1

## 2015-09-08 MED ORDER — ACETAMINOPHEN 325 MG PO TABS: 650.0000 mg | ORAL_TABLET | ORAL | Status: DC | PRN

## 2015-09-08 MED ORDER — ENOXAPARIN SODIUM 40 MG/0.4ML ~~LOC~~ SOLN
40.0000 mg | SUBCUTANEOUS | Status: DC
  Administered 2015-09-08: 40 mg via SUBCUTANEOUS
  Filled 2015-09-08: qty 0.4

## 2015-09-08 MED ORDER — SODIUM CHLORIDE 0.9 % IJ SOLN: 3.0000 mL | INTRAMUSCULAR | Status: DC | PRN

## 2015-09-08 MED ORDER — DULOXETINE HCL 60 MG PO CPEP: 60.0000 mg | ORAL_CAPSULE | Freq: Every day | ORAL | Status: DC

## 2015-09-08 MED ORDER — IOHEXOL 350 MG/ML SOLN
80.0000 mL | Freq: Once | INTRAVENOUS | Status: AC | PRN
  Administered 2015-09-08: 80 mL via INTRAVENOUS

## 2015-09-08 MED ORDER — NITROGLYCERIN 0.4 MG SL SUBL: 0.4000 mg | SUBLINGUAL_TABLET | SUBLINGUAL | Status: DC | PRN

## 2015-09-08 MED ORDER — PANTOPRAZOLE SODIUM 40 MG PO TBEC: 40.0000 mg | DELAYED_RELEASE_TABLET | Freq: Every day | ORAL | Status: DC

## 2015-09-08 MED ORDER — ONDANSETRON HCL 4 MG/2ML IJ SOLN: 4.0000 mg | Freq: Four times a day (QID) | INTRAMUSCULAR | Status: DC | PRN

## 2015-09-08 MED ORDER — FERROUS SULFATE 325 (65 FE) MG PO TABS
325.0000 mg | ORAL_TABLET | Freq: Three times a day (TID) | ORAL | Status: DC
  Filled 2015-09-08: qty 1

## 2015-09-08 MED ORDER — DIPHENHYDRAMINE HCL 25 MG PO TABS
25.0000 mg | ORAL_TABLET | Freq: Four times a day (QID) | ORAL | Status: DC | PRN
  Filled 2015-09-08: qty 1

## 2015-09-08 NOTE — Patient Instructions (Addendum)
Medication Instructions:  Your physician recommends that you continue on your current medications as directed. Please refer to the Current Medication list given to you today.   Labwork: NONE  Testing/Procedures: Your physician has requested that you have a lexiscan myoview. For further information please visit HugeFiesta.[email or web address]. Please follow instruction sheet, as given.   Follow-Up: KEEP YOUR FOLLOW UP PLANNED  Any Other Special Instructions Will Be Listed Below (If Applicable).

## 2015-09-08 NOTE — Telephone Encounter (Signed)
Ptcb and has been notified of D-dimer results and is agreeable to having chest CT-A to r/o PE. I advised pt scheduler will call her with an appt to have this done either today or tomorrow. Pt agreeable to plan of care.

## 2015-09-08 NOTE — Addendum Note (Signed)
Addended byKathlen Mody, Nicki Reaper T on: 09/08/2015 08:32 AM   Modules accepted: Orders

## 2015-09-08 NOTE — Progress Notes (Signed)
Admission History and Physical    Date:  09/08/2015   ID:  Haley Harding, DOB 1966-06-04, MRN [id number]  PCP:  Scarlette Calico, MD  Cardiologist:  Dr. Lauree Chandler   Electrophysiologist:  n/a  No chief complaint on file.    History of Present Illness: Haley Harding is a 49 y.o. female with a hx of HTN, depression.  Initially seen by Dr. Lauree Chandler in 2012 for evaluation of chest pain. She had been doing gymnastics with her daughter and began to feel her heart racing and she felt dyspneic. She then felt a discomfort in her chest and left arm. She went into the ED at [facility]. Her cardiac enzymes were negative. Her d-dimer was elevated. CTA chest was negative for PE or other thoracic disease. She started a stress echo in the ED but she became frustrated and left AMA before the images and stress test were completed. She noted daily palpitations and Holter monitor demonstrated PVCs, PACs.  Echo showed normal LV size and function and mod TR.  Last seen by Dr. Lauree Chandler in 1/14.  Last echo in 7/14 only showed mild TR.   She was seen in the ED 9/16 with chest pain.  K+ 3.2, Cr 0.71, Hgb 11.2, Trop neg.  CXR was unremarkable.    I saw her yesterday for chest pain, palpitations and dizziness.  Symptoms were somewhat atypical.  She had a near syncopal spell.  I set her up for a plain ETT and obtained a DDimer that was minimally elevated.  Chest CTA demonstrated no pulmonary embolism.  No obvious abnormality of the aorta noted.  She developed chest pain and dizziness while in the CT scanner and was added on to my schedule.  She has been more short of breath. Denies syncope.  No edema.  I elected to get her in for an ETT-Myoview instead of an ETT and do it in the next couple of days.  She then got dizzy again and had more chest pain while at checkout. She told the staff she was going straight to the ED.     Studies/Reports Reviewed Today:  Echo 7/14 EF 60% to  65%.  Holter 1/13 NSR, PACs, PVCs No AFib  Past Medical History  Diagnosis Date  . Depression   . Herpes genitalia   . Anemia   . Hypertension   . Uterine cyst   . Menorrhagia   . Iron deficiency anemia due to chronic blood loss   . Mass of uterus 10/07/2013    Past Surgical History  Procedure Laterality Date  . Tubal ligation    . Esophagogastroduodenoscopy N/A 01/24/2014    Procedure: ESOPHAGOGASTRODUODENOSCOPY (EGD);  Surgeon: Jerene Bears, MD;  Location: [facility];  Service: Gastroenterology;  Laterality: N/A;     Current Outpatient Prescriptions  Medication Sig Dispense Refill  . acyclovir (ZOVIRAX) 800 MG tablet TAKE 1 TABLET BY MOUTH THREE TIMES A DAY 30 tablet 5  . amLODipine (NORVASC) 5 MG tablet Take 1 tablet (5 mg total) by mouth daily. 90 tablet 3  . diphenhydrAMINE (BENADRYL) 25 MG tablet Take 25 mg by mouth every 6 (six) hours as needed for sleep.    . DULoxetine (CYMBALTA) 60 MG capsule TAKE 1 CAPSULE BY MOUTH DAILY. 90 capsule 3  . ferrous sulfate 325 (65 FE) MG EC tablet Take 325 mg by mouth 3 (three) times daily with meals.    . hydrochlorothiazide (MICROZIDE) 12.5 MG capsule Take 1 capsule (12.5 mg  total) by mouth daily. 90 capsule 4  . meclizine (ANTIVERT) 12.5 MG tablet Take 12.5 mg by mouth 3 (three) times daily as needed for dizziness.    Marland Kitchen omeprazole (PRILOSEC) 20 MG capsule Take 1 capsule (20 mg total) by mouth daily. 30 capsule 1  . potassium chloride SA (K-DUR,KLOR-CON) 20 MEQ tablet Take 1 tablet (20 mEq total) by mouth daily. 90 tablet 3   No current facility-administered medications for this visit.    Allergies:   Review of patient's allergies indicates no active allergies.    Social History:  The patient  reports that she has never smoked. She has never used smokeless tobacco. She reports that she does not drink alcohol or use illicit drugs.   Family History:  The patient's family history includes Diabetes in her father; Hypertension in  her father and mother. There is no history of Cancer, Alcohol abuse, Early death, Hearing loss, Heart disease, Hyperlipidemia, Kidney disease, or Stroke.    ROS:   Please see the history of present illness.   Review of Systems  Constitution: Negative for fever.  Respiratory: Negative for cough and wheezing.   Gastrointestinal: Negative for diarrhea and vomiting.  Neurological: Positive for dizziness.  All other systems reviewed and are negative.     PHYSICAL EXAM: VS:  BP 140/70 mmHg  Pulse 76  Resp 12  Ht 5\' 8"  (1.727 m)  Wt 178 lb (80.74 kg)  BMI 27.07 kg/m2  LMP 09/08/2015    Wt Readings from Last 3 Encounters:  09/08/15 178 lb (80.74 kg)  09/07/15 178 lb (80.74 kg)  09/04/15 176 lb (79.833 kg)     GEN: Well nourished, well developed, in no acute distress HEENT: normal Neck: no JVD  no masses Cardiac:  Normal S1/S2, RRR; no murmur, no rubs or gallops, no edema   Respiratory:  clear to auscultation bilaterally, no wheezing, rhonchi or rales. GI: soft, nontender, nondistended, + BS MS: no deformity or atrophy Skin: warm and dry  Neuro:  CNs II-XII intact, Strength and sensation are intact Psych: Normal affect   EKG:  EKG is ordered today.  It demonstrates:   NSR, HR 82, normal axis, NSSTTW changes.   Recent Labs: 10/27/2014: ALT 13; TSH 0.56 09/04/2015: BUN 7; Creatinine, Ser 0.71; Hemoglobin 11.2*; Platelets 504*; Potassium 3.2*; Sodium 137    Lipid Panel    Component Value Date/Time   CHOL 158 10/27/2014 1117   TRIG 57.0 10/27/2014 1117   HDL 38.60* 10/27/2014 1117   CHOLHDL 4 10/27/2014 1117   VLDL 11.4 10/27/2014 1117   LDLCALC 108* 10/27/2014 1117      ASSESSMENT AND PLAN:  1. Chest pain:  Symptoms still sound atypical. But her constellation of symptoms have persisted and worsened and she is obviously quite distressed about he symptoms. She is tearful in the office.  I suspect anxiety is playing a large role. However, given her ongoing symptoms in  the office, I think it is best to admit her to the hospital for observation.  I reviewed this with Dr. Cristopher Peru who agreed.    -  Admit to observation  -  Cycle cardiac enzymes  -  Check Echo  -  Plan inpatient ETT-Myoview in AM if rules out  2. Dizziness: Etiology not entirely clear. She's been taking meclizine with some relief. I suspect a lot of her symptoms may be related to anxiety.  Will get Head CT without contrast.  May need to get Neuro eval or PT/Vestibular eval  in the hospital depending upon her symptoms.   3. Hypokalemia: K+ started yesterday. Will check BMET.   4. HTN:  Controlled.    Medication Changes: Current medicines are reviewed at length with the patient today.  Concerns regarding medicines are as outlined above.  The following changes have been made:   Discontinued Medications   No medications on file   Modified Medications   No medications on file   New Prescriptions   No medications on file    Labs/ tests ordered today include:   Orders Placed This Encounter  Procedures  . Myocardial Perfusion Imaging  . EKG 12-Lead      Disposition:    Admit to [facility].  Her mother will drive her.    Signed, Versie Starks, MHS 09/08/2015 5:51 PM    [facility], [place], [place]  [address] Phone: [phone]; Fax: [phone]    Cardiology Attending  Patient seen and examined. Agree with the findings as noted above. The patient's exam as documented represents my findings as well. She has both typical and atypical chest pain. She will be admitted for observation as noted above.  Mikle Bosworth.D.

## 2015-09-08 NOTE — Telephone Encounter (Signed)
Lmptcb to go over D-dimer results and recommendation for Chest CT-A r/o PE.

## 2015-09-08 NOTE — Progress Notes (Signed)
New pt direct admission. Pt arrived to the floor in stable condition. Vitals taken. Initial Assessment done. All immediate pertinent needs to patient addressed. Patient Guide given to patient. Important safety instructions relating to hospitalization reviewed with patient. Patient verbalized understanding. Will continue to monitor pt.  Maurene Capes RN

## 2015-09-09 ENCOUNTER — Encounter (HOSPITAL_COMMUNITY): Payer: Self-pay | Admitting: General Practice

## 2015-09-09 ENCOUNTER — Observation Stay (HOSPITAL_COMMUNITY): Payer: 59

## 2015-09-09 DIAGNOSIS — R072 Precordial pain: Secondary | ICD-10-CM

## 2015-09-09 DIAGNOSIS — R42 Dizziness and giddiness: Secondary | ICD-10-CM | POA: Diagnosis not present

## 2015-09-09 DIAGNOSIS — F411 Generalized anxiety disorder: Secondary | ICD-10-CM | POA: Diagnosis present

## 2015-09-09 DIAGNOSIS — F329 Major depressive disorder, single episode, unspecified: Secondary | ICD-10-CM | POA: Diagnosis not present

## 2015-09-09 DIAGNOSIS — F32A Depression, unspecified: Secondary | ICD-10-CM | POA: Diagnosis present

## 2015-09-09 DIAGNOSIS — R079 Chest pain, unspecified: Secondary | ICD-10-CM

## 2015-09-09 DIAGNOSIS — I10 Essential (primary) hypertension: Secondary | ICD-10-CM | POA: Diagnosis not present

## 2015-09-09 LAB — BASIC METABOLIC PANEL
ANION GAP: 9 (ref 5–15)
BUN: 9 mg/dL (ref 6–20)
CHLORIDE: 103 mmol/L (ref 101–111)
CO2: 25 mmol/L (ref 22–32)
Calcium: 8.8 mg/dL — ABNORMAL LOW (ref 8.9–10.3)
Creatinine, Ser: 0.74 mg/dL (ref 0.44–1.00)
GFR calc Af Amer: >60 mL/min (ref >60)
GLUCOSE: 111 mg/dL — AB (ref 65–99)
POTASSIUM: 3.4 mmol/L — AB (ref 3.5–5.1)
Sodium: 137 mmol/L (ref 135–145)

## 2015-09-09 LAB — TROPONIN I

## 2015-09-09 LAB — NM MYOCAR MULTI W/SPECT W/WALL MOTION / EF
CSEPHR: 71 %
MPHR: 172 {beats}/min
Peak HR: 123 {beats}/min
Rest HR: 70 {beats}/min

## 2015-09-09 LAB — LIPID PANEL
CHOL/HDL RATIO: 3.3 ratio
CHOLESTEROL: 134 mg/dL (ref 0–200)
HDL: 41 mg/dL (ref >40)
LDL Cholesterol: 77 mg/dL (ref 0–99)
TRIGLYCERIDES: 79 mg/dL (ref <150)
VLDL: 16 mg/dL (ref 0–40)

## 2015-09-09 MED ORDER — REGADENOSON 0.4 MG/5ML IV SOLN
INTRAVENOUS | Status: AC
  Filled 2015-09-09: qty 5

## 2015-09-09 MED ORDER — TECHNETIUM TC 99M SESTAMIBI GENERIC - CARDIOLITE
30.0000 | Freq: Once | INTRAVENOUS | Status: AC | PRN
  Administered 2015-09-09: 30 via INTRAVENOUS

## 2015-09-09 MED ORDER — REGADENOSON 0.4 MG/5ML IV SOLN
0.4000 mg | Freq: Once | INTRAVENOUS | Status: AC
  Administered 2015-09-09: 0.4 mg via INTRAVENOUS
  Filled 2015-09-09: qty 5

## 2015-09-09 MED ORDER — POTASSIUM CHLORIDE CRYS ER 20 MEQ PO TBCR: 40.0000 meq | EXTENDED_RELEASE_TABLET | Freq: Two times a day (BID) | ORAL | Status: DC

## 2015-09-09 MED ORDER — TECHNETIUM TC 99M SESTAMIBI GENERIC - CARDIOLITE
10.0000 | Freq: Once | INTRAVENOUS | Status: AC | PRN
  Administered 2015-09-09: 10 via INTRAVENOUS

## 2015-09-09 NOTE — Discharge Summary (Signed)
Pt got discharged, Tele DC, IV taken out, discharge instructions provided and pt is following up with PCP in 1 week, pt left the hospital with all of her belongings in a stable condition

## 2015-09-09 NOTE — Progress Notes (Signed)
5 mins post scan pt reports "intense headache" and "dull Left side chest pain." Katie PA at bedside, request to monitor until heart rate is below 100. Pt alert and oriented, tearful

## 2015-09-09 NOTE — Progress Notes (Signed)
Patient ID: Haley Harding, female   DOB: [birth year], 49 y.o.   MRN: [id number]  Cardiology Attending  Patient seen and examined. I have reviewed the findings as noted by Mr. Kathlen Mody, [place]. I have supervised Mr. Kathlen Mody in the medical decision making and his assessment and plan reflect mine. The patient is admitted for observation. I expect early discharge.   Mikle Bosworth.D.

## 2015-09-09 NOTE — Discharge Summary (Addendum)
Discharge Summary   Patient ID: Haley Harding MRN: [id number], DOB/AGE: 1966/04/23 49 y.o. Admit date: 09/08/2015 D/C date:     09/09/2015  Primary Cardiologist: Dr. Angelena Form   Principal Problem:   Chest pain Active Problems:   Essential hypertension, benign   Dizziness   Depression   Anxiety   Admission Dates: 09/08/15-09/09/15 Discharge Diagnosis: atypical chest pain s/p low risk nuclear stress test   HPI: Haley Harding is a 49 y.o. female with a history of anxiety/depression, HTN and recurrent chest pain who was admitted from the office to [facility] on 09/08/15 for atypical chest pain and dizziness  She was initially seen by Dr. Lauree Chandler in 2012 for evaluation of chest pain. She had been doing gymnastics with her daughter and began to feel her heart racing and she felt dyspneic. She then felt a discomfort in her chest and left arm. She went into the ED at [facility] Her cardiac enzymes were negative. Her d-dimer was elevated. CTA chest was negative for PE or other thoracic disease. She started a stress echo in the ED but she became frustrated and left AMA before the images and stress test were completed. She noted daily palpitations and Holter monitor demonstrated PVCs, PACs. Echo showed normal LV size and function and mod TR. Last seen by Dr. Lauree Chandler in 01/01/15. Last echo in 7/14 only showed mild TR.   She was seen in the ED 9/16 with chest pain. K+ 3.2, Cr 0.71, Hgb 11.2, Trop neg. CXR was unremarkable.   She was seen by Richardson Dopp PA-C on 09/04/15 for chest pain, palpitations and dizziness. Symptoms were somewhat atypical. She had a near syncopal spell. He set her up for a plain ETT and obtained a DDimer that was minimally elevated. Chest CTA demonstrated no pulmonary embolism. No obvious abnormality of the aorta noted. She developed chest pain and dizziness while in the CT scanner and was added back on to Scott's schedule. She was more short  of breath. He then set her up for an ETT- Myoview for the next couple day, but then got dizzy again and had more chest pain while at checkout. She told the staff she was going straight to the ED. She was admitted to [facility] for further work up.     Hospital Course  1. Chest pain: Symptoms still sound atypical. But her constellation of symptoms have persisted and worsened and she is obviously quite distressed about he symptoms. She was tearful in the office. Anxiety suspected to be playing a large role. However, given her ongoing symptoms in the office, it was felt best to admit her to the hospital for observation.  -- Troponin neg x3 and ECG with no acute ST or TW changes.  -- 2D ECHO cancelled by Dr. Sallyanne Kuster as all have been previously normal -- Underwent lexiscan nuclear stress test 09/09/15 which was negative for ischemia EF 69%. She was originally scheduled as an ETT nuc but this was converted to [place] due to heavy menstrual bleeding and dizziness.   2. Dizziness: Etiology not entirely clear. She's been taking meclizine with some relief. It was suspected that a lot of her symptoms may be related to anxiety.Head CT without contrast ordered. May need to get Neuro eval or PT/Vestibular eval in the hospital depending upon her symptoms. This can all be done as an outpatient  -- I have provided a referal to [facility] for her dizziness. The office will call her to make an  appointment  3. Hypokalemia: supplemented.   4. HTN: Controlled on amlodipine 5mg  and HCTZ 12.5mg    5. Anxiety/Depression- this may be the underlying cause of chest pain and dizziness. She was previous on Duloxetine but this is now listed as an allergy due to HA.  Refer to PCP for possible medical treatment for this.  The patient has had an uncomplicated hospital course and is recovering well. She has been seen by Dr. Sallyanne Kuster  today and deemed ready for discharge home. All follow-up appointments have been scheduled.  Discharge  medications are listed below.   Discharge Vitals: Blood pressure 126/78, pulse 97, temperature 97.7 F (36.5 C), temperature source Oral, resp. rate 20, height 5\' 8"  (1.727 m), weight 79.47 kg (175 lb 3.2 oz), last menstrual period 09/08/2015, SpO2 99 %.  Labs: Lab Results  Component Value Date   WBC 7.9 09/08/2015   HGB 12.7 09/08/2015   HCT 39.5 09/08/2015   MCV 82.3 09/08/2015   PLT 559* 09/08/2015     Recent Labs Lab 09/09/15 0625  NA 137  K 3.4*  CL 103  CO2 25  BUN 9  CREATININE 0.74  CALCIUM 8.8*  GLUCOSE 111*    Recent Labs  09/08/15 1840 09/09/15 0030 09/09/15 0625  TROPONINI <0.03 <0.03 <0.03   Lab Results  Component Value Date   CHOL 134 09/09/2015   HDL 41 09/09/2015   LDLCALC 77 09/09/2015   TRIG 79 09/09/2015   Lab Results  Component Value Date   DDIMER 0.57* 09/07/2015    Diagnostic Studies/Procedures   Dg Chest 2 View  09/04/2015   CLINICAL DATA:  Left side chest pain  EXAM: CHEST  2 VIEW  COMPARISON:  10/07/2011  FINDINGS: Cardiomediastinal silhouette is stable. No acute infiltrate or pleural effusion. No pulmonary edema. Bony thorax is unremarkable.  IMPRESSION: No active cardiopulmonary disease.   Electronically Signed   By: Lahoma Crocker M.D.   On: 09/04/2015 17:21   Ct Angio Chest Pe W/cm &/or Wo Cm  09/08/2015   CLINICAL DATA:  Left upper chest pain, elevated D-dimer  EXAM: CT ANGIOGRAPHY CHEST WITH CONTRAST  TECHNIQUE: Multidetector CT imaging of the chest was performed using the standard protocol during bolus administration of intravenous contrast. Multiplanar CT image reconstructions and MIPs were obtained to evaluate the vascular anatomy.  CONTRAST:  36mL OMNIPAQUE IOHEXOL 350 MG/ML SOLN  COMPARISON:  Chest x-ray of 09/04/2015 and CT angio chest of 10/07/2011  FINDINGS: The pulmonary arteries are well opacified. There is no evidence of acute pulmonary embolism. The thoracic aorta is not as well opacified but no acute abnormality is seen. No  mediastinal or hilar adenopathy is seen. Mild cardiomegaly is stable. The thyroid gland remains somewhat asymmetrically prominent left lobe larger than right.  On lung window images, no lung infiltrate is seen and there is no evidence of pleural effusion. No suspicious lung nodule is noted. The central airway is patent. Minimally prominent axillary lymph nodes are present none of which are pathologically enlarged. The thoracic vertebrae are in normal alignment. No acute abnormality is seen.  Review of the MIP images confirms the above findings.  IMPRESSION: 1. Negative CT angiogram of the chest. No evidence of acute pulmonary embolism is seen. 2. Slight asymmetric prominence of the thyroid gland. Correlate clinically. Ultrasound may be helpful.   Electronically Signed   By: Ivar Drape M.D.   On: 09/08/2015 14:56   Nm Myocar Multi W/spect W/wall Motion / Ef  09/09/2015   CLINICAL DATA:  Chest pain.  Hypertension.  Dizziness.  EXAM: MYOCARDIAL IMAGING WITH SPECT (REST AND EXERCISE)  GATED LEFT VENTRICULAR WALL MOTION STUDY  LEFT VENTRICULAR EJECTION FRACTION  TECHNIQUE: Standard myocardial SPECT imaging was performed after resting intravenous injection of 10 mCi Tc-82m sestamibi. Subsequently, exercise tolerance test was performed by the patient under the supervision of the Cardiology staff. At peak-stress, 30 mCi Tc-81m sestamibi was injected intravenously and standard myocardial SPECT imaging was performed. Quantitative gated imaging was also performed to evaluate left ventricular wall motion, and estimate left ventricular ejection fraction.  COMPARISON:  09/08/2015 CT  FINDINGS: Perfusion: No decreased activity in the left ventricle on stress imaging to suggest reversible ischemia or infarction.  Wall Motion: Normal left ventricular wall motion. No left ventricular dilation.  Left Ventricular Ejection Fraction: 69 %  End diastolic volume 58 ml  End systolic volume 18 ml  IMPRESSION: 1. No reversible ischemia or  infarction.  2. Normal left ventricular wall motion.  3. Left ventricular ejection fraction 69%  4. Low-risk stress test findings*.  *2012 Appropriate Use Criteria for Coronary Revascularization Focused Update: J Am Coll Cardiol. 6269;48(5):462-703. http://content.airportbarriers.com.aspx?articleid=1201161   Electronically Signed   By: Dereck Ligas M.D.   On: 09/09/2015 14:30    Discharge Medications     Medication List    TAKE these medications        acyclovir 800 MG tablet  Commonly known as:  ZOVIRAX  TAKE 1 TABLET BY MOUTH THREE TIMES A DAY     amLODipine 5 MG tablet  Commonly known as:  NORVASC  Take 1 tablet (5 mg total) by mouth daily.     diphenhydrAMINE 25 MG tablet  Commonly known as:  BENADRYL  Take 25 mg by mouth every 6 (six) hours as needed for sleep.     DULoxetine 60 MG capsule  Commonly known as:  CYMBALTA  TAKE 1 CAPSULE BY MOUTH DAILY.     ferrous sulfate 325 (65 FE) MG EC tablet  Take 325 mg by mouth 3 (three) times daily with meals.     hydrochlorothiazide 12.5 MG capsule  Commonly known as:  MICROZIDE  Take 1 capsule (12.5 mg total) by mouth daily.     meclizine 12.5 MG tablet  Commonly known as:  ANTIVERT  Take 12.5 mg by mouth 3 (three) times daily as needed for dizziness.     omeprazole 20 MG capsule  Commonly known as:  PRILOSEC  Take 1 capsule (20 mg total) by mouth daily.     potassium chloride SA 20 MEQ tablet  Commonly known as:  K-DUR,KLOR-CON  Take 1 tablet (20 mEq total) by mouth daily.        Disposition   The patient will be discharged in stable condition to home.  Follow-up Information    Follow up with [facility].   Why:  The office will call you to make an appoinment to talk about your dizziness. , If you do not hear from them, please contact them.   Contact information:   [address]      Follow up with Scarlette Calico, MD.   Specialty:   Internal Medicine   Why:  Please call your PCP to make an appoinment.   Contact information:   [address] [address] [phone]         Duration of Discharge Encounter: Greater than 30 minutes including physician and PA time.  Signed,  Angelena Form R PA-C 09/09/2015, 3:51 PM

## 2015-09-09 NOTE — Progress Notes (Signed)
Patient Name: Haley Harding Date of Encounter: 09/09/2015     Principal Problem:   Chest pain Active Problems:   Essential hypertension, benign   Dizziness    SUBJECTIVE  Had ongoing chest pain and dizziness throughout the night and continues now. Seen in nuc med for nuclear stress test. She was originally scheduled as an ETT nuc but this was converted to [place] due to heavy menstrual bleeding and dizziness.   CURRENT MEDS . amLODipine  5 mg Oral Daily  . aspirin  324 mg Oral NOW   Or  . aspirin  300 mg Rectal NOW  . aspirin EC  81 mg Oral Daily  . enoxaparin (LOVENOX) injection  40 mg Subcutaneous Q24H  . ferrous sulfate  325 mg Oral TID WC  . hydrochlorothiazide  12.5 mg Oral Daily  . potassium chloride SA  20 mEq Oral Daily  . regadenoson      . regadenoson  0.4 mg Intravenous Once  . sodium chloride  3 mL Intravenous Q12H    OBJECTIVE  Filed Vitals:   09/08/15 1815 09/08/15 2043 09/09/15 0515 09/09/15 0955  BP: 171/93 139/82 130/74 138/90  Pulse: 85 87 75 80  Temp: 97.9 F (36.6 C) 98.1 F (36.7 C) 98.1 F (36.7 C)   TempSrc: Oral Oral Oral   Resp: 14 16 18    Height: 5\' 8"  (1.727 m)     Weight: 176 lb 6.4 oz (80.015 kg)  175 lb 3.2 oz (79.47 kg)   SpO2: 99% 100% 98%     Intake/Output Summary (Last 24 hours) at 09/09/15 0956 Last data filed at 09/09/15 0900  Gross per 24 hour  Intake    580 ml  Output      0 ml  Net    580 ml   Filed Weights   09/08/15 1815 09/09/15 0515  Weight: 176 lb 6.4 oz (80.015 kg) 175 lb 3.2 oz (79.47 kg)    PHYSICAL EXAM  General: Pleasant, NAD. Neuro: Alert and oriented X 3. Moves all extremities spontaneously. Psych: Normal affect. HEENT:  Normal  Neck: Supple without bruits or JVD. Lungs:  Resp regular and unlabored, CTA. Heart: RRR no s3, s4, or murmurs. Abdomen: Soft, non-tender, non-distended, BS + x 4.  Extremities: No clubbing, cyanosis or edema. DP/PT/Radials 2+ and equal bilaterally.  Accessory  Clinical Findings  CBC  Recent Labs  09/08/15 1840  WBC 7.9  NEUTROABS 5.5  HGB 12.7  HCT 39.5  MCV 82.3  PLT 357*   Basic Metabolic Panel  Recent Labs  09/08/15 1840  NA 139  K 3.3*  CL 101  CO2 30  GLUCOSE 105*  BUN 7  CREATININE 0.69  CALCIUM 9.6    Cardiac Enzymes  Recent Labs  09/08/15 1840 09/09/15 0030 09/09/15 0625  TROPONINI <0.03 <0.03 <0.03   BNP Invalid input(s): POCBNP D-Dimer  Recent Labs  09/07/15 1619  DDIMER 0.57*   Hemoglobin A1C No results for input(s): HGBA1C in the last 72 hours. Fasting Lipid Panel  Recent Labs  09/09/15 0625  CHOL 134  HDL 41  LDLCALC 77  TRIG 79  CHOLHDL 3.3   Thyroid Function Tests  Recent Labs  09/08/15 1840  TSH 1.009    TELE  NSR  Radiology/Studies  Dg Chest 2 View  09/04/2015   CLINICAL DATA:  Left side chest pain  EXAM: CHEST  2 VIEW  COMPARISON:  10/07/2011  FINDINGS: Cardiomediastinal silhouette is stable. No acute infiltrate or pleural effusion. No  pulmonary edema. Bony thorax is unremarkable.  IMPRESSION: No active cardiopulmonary disease.   Electronically Signed   By: Lahoma Crocker M.D.   On: 09/04/2015 17:21   Ct Angio Chest Pe W/cm &/or Wo Cm  09/08/2015   CLINICAL DATA:  Left upper chest pain, elevated D-dimer  EXAM: CT ANGIOGRAPHY CHEST WITH CONTRAST  TECHNIQUE: Multidetector CT imaging of the chest was performed using the standard protocol during bolus administration of intravenous contrast. Multiplanar CT image reconstructions and MIPs were obtained to evaluate the vascular anatomy.  CONTRAST:  58mL OMNIPAQUE IOHEXOL 350 MG/ML SOLN  COMPARISON:  Chest x-ray of 09/04/2015 and CT angio chest of 10/07/2011  FINDINGS: The pulmonary arteries are well opacified. There is no evidence of acute pulmonary embolism. The thoracic aorta is not as well opacified but no acute abnormality is seen. No mediastinal or hilar adenopathy is seen. Mild cardiomegaly is stable. The thyroid gland remains  somewhat asymmetrically prominent left lobe larger than right.  On lung window images, no lung infiltrate is seen and there is no evidence of pleural effusion. No suspicious lung nodule is noted. The central airway is patent. Minimally prominent axillary lymph nodes are present none of which are pathologically enlarged. The thoracic vertebrae are in normal alignment. No acute abnormality is seen.  Review of the MIP images confirms the above findings.  IMPRESSION: 1. Negative CT angiogram of the chest. No evidence of acute pulmonary embolism is seen. 2. Slight asymmetric prominence of the thyroid gland. Correlate clinically. Ultrasound may be helpful.   Electronically Signed   By: Ivar Drape M.D.   On: 09/08/2015 14:56    ASSESSMENT AND PLAN  Haley Harding is a 49 y.o. female with a history of anxiety, HTN and recurrent chest pain who was admitted from the office to [facility] on 09/08/15 for atypical chest pain and dizziness.   1. Chest pain: Symptoms still sound atypical. But her constellation of symptoms have persisted and worsened and she is obviously quite distressed about he symptoms. She was tearful in the office. Anxiety suspected to be playing a large role. However, given her ongoing symptoms in the office, it was felt best to admit her to the hospital for observation.  -- Troponin neg x3 and ECG with no acute ST or TW changes.  -- 2D ECHO pending -- Underewent nuclear stress test. She did have diffuse TWI after administration of lexiscan. Await images.   2. Dizziness: Etiology not entirely clear. She's been taking meclizine with some relief. It was suspected that a lot of her symptoms may be related to anxiety.Head CT without contrast pending. May need to get Neuro eval or PT/Vestibular eval in the hospital depending upon her symptoms.   3. Hypokalemia: K+ started yesterday. No new lab this AM. I have put in a BMET   4. HTN: Controlled.  Haley Harding R PA-C  Pager  [phone]  I have seen and examined the patient along with Angelena Form R PA-C.  I have reviewed the chart, notes and new data.  I agree with PA/NP's note.  Key new complaints: chest pain is atypical Key examination changes: normal cardiac exam Key new findings / data: nuclear study and echo pending, but note complete absene of visible coronary or aortic calcium on chest CT. T wave inversion with Carlton Adam is not uncommon and has no diagnostic value for CAD.  PLAN: No further inpatient workup planned if the stress test confirms absence of ischemia. She had a normal echo 2  years ago. Repeat echo can be done as an outpatient if needed. She is not sure she wants to have the head CT done. This can also be an outpatient evaluation.  Sanda Klein, MD, [place] [phone] 09/09/2015, 10:43 AM

## 2015-09-10 ENCOUNTER — Telehealth: Payer: Self-pay | Admitting: *Deleted

## 2015-09-10 ENCOUNTER — Encounter (HOSPITAL_COMMUNITY): Payer: 59

## 2015-09-10 NOTE — Telephone Encounter (Signed)
Pt was notified no PE. Pt advised to f/u w/PCP for slight prominence of the thyroid gland. I will send report to PCP per Brynda Rim. PA; per PA pt need to f/u w/PCP for this and needs to get an Thyroid US.

## 2015-09-13 NOTE — H&P (Signed)
Date: 09/08/2015   ID: Haley Harding, DOB [birth year], MRN [id number]  PCP: Haley Calico, MD Cardiologist: Dr. Lauree Harding  Electrophysiologist: n/a  No chief complaint on file.   History of Present Illness: Haley Harding is a 49 y.o. female with a hx of HTN, depression. Initially seen by Dr. Lauree Harding in 2012 for evaluation of chest pain. She had been doing gymnastics with her daughter and began to feel her heart racing and she felt dyspneic. She then felt a discomfort in her chest and left arm. She went into the ED at [facility]. Her cardiac enzymes were negative. Her d-dimer was elevated. CTA chest was negative for PE or other thoracic disease. She started a stress echo in the ED but she became frustrated and left AMA before the images and stress test were completed. She noted daily palpitations and Holter monitor demonstrated PVCs, PACs. Echo showed normal LV size and function and mod TR. Last seen by Dr. Lauree Harding in 1/14. Last echo in 7/14 only showed mild TR.   She was seen in the ED 9/16 with chest pain. K+ 3.2, Cr 0.71, Hgb 11.2, Trop neg. CXR was unremarkable.   I saw her yesterday for chest pain, palpitations and dizziness. Symptoms were somewhat atypical. She had a near syncopal spell. I set her up for a plain ETT and obtained a DDimer that was minimally elevated. Chest CTA demonstrated no pulmonary embolism. No obvious abnormality of the aorta noted. She developed chest pain and dizziness while in the CT scanner and was added on to my schedule. She has been more short of breath. Denies syncope. No edema. I elected to get her in for an ETT-Myoview instead of an ETT and do it in the next couple of days. She then got dizzy again and had more chest pain while at checkout. She told the staff she was going straight to the ED.    Studies/Reports Reviewed Today:  Echo 7/14 EF 60% to 65%.  Holter 1/13 NSR, PACs,  PVCs No AFib  Past Medical History  Diagnosis Date  . Depression   . Herpes genitalia   . Anemia   . Hypertension   . Uterine cyst   . Menorrhagia   . Iron deficiency anemia due to chronic blood loss   . Mass of uterus 10/07/2013    Past Surgical History  Procedure Laterality Date  . Tubal ligation    . Esophagogastroduodenoscopy N/A 01/24/2014    Procedure: ESOPHAGOGASTRODUODENOSCOPY (EGD); Surgeon: Haley Bears, MD; Location: [facility]; Service: Gastroenterology; Laterality: N/A;     Current Outpatient Prescriptions  Medication Sig Dispense Refill  . acyclovir (ZOVIRAX) 800 MG tablet TAKE 1 TABLET BY MOUTH THREE TIMES A DAY 30 tablet 5  . amLODipine (NORVASC) 5 MG tablet Take 1 tablet (5 mg total) by mouth daily. 90 tablet 3  . diphenhydrAMINE (BENADRYL) 25 MG tablet Take 25 mg by mouth every 6 (six) hours as needed for sleep.    . DULoxetine (CYMBALTA) 60 MG capsule TAKE 1 CAPSULE BY MOUTH DAILY. 90 capsule 3  . ferrous sulfate 325 (65 FE) MG EC tablet Take 325 mg by mouth 3 (three) times daily with meals.    . hydrochlorothiazide (MICROZIDE) 12.5 MG capsule Take 1 capsule (12.5 mg total) by mouth daily. 90 capsule 4  . meclizine (ANTIVERT) 12.5 MG tablet Take 12.5 mg by mouth 3 (three) times daily as needed for dizziness.    Marland Kitchen omeprazole (PRILOSEC) 20 MG capsule Take 1  capsule (20 mg total) by mouth daily. 30 capsule 1  . potassium chloride SA (K-DUR,KLOR-CON) 20 MEQ tablet Take 1 tablet (20 mEq total) by mouth daily. 90 tablet 3   No current facility-administered medications for this visit.    Allergies: Review of patient's allergies indicates no active allergies.    Social History: The patient  reports that she has never smoked. She has never used smokeless tobacco. She reports that she does not drink alcohol or use illicit drugs.   Family History: The  patient's family history includes Diabetes in her father; Hypertension in her father and mother. There is no history of Cancer, Alcohol abuse, Early death, Hearing loss, Heart disease, Hyperlipidemia, Kidney disease, or Stroke.    ROS:  Please see the history of present illness.  Review of Systems  Constitution: Negative for fever.  Respiratory: Negative for cough and wheezing.  Gastrointestinal: Negative for diarrhea and vomiting.  Neurological: Positive for dizziness.  All other systems reviewed and are negative.     PHYSICAL EXAM: VS: BP 140/70 mmHg  Pulse 76  Resp 12  Ht 5\' 8"  (1.727 m)  Wt 178 lb (80.74 kg)  BMI 27.07 kg/m2  LMP 09/08/2015   Wt Readings from Last 3 Encounters:  09/08/15 178 lb (80.74 kg)  09/07/15 178 lb (80.74 kg)  09/04/15 176 lb (79.833 kg)     GEN: Well nourished, well developed, in no acute distress  HEENT: normal  Neck: no JVD no masses Cardiac: Normal S1/S2, RRR; no murmur, no rubs or gallops, no edema  Respiratory: clear to auscultation bilaterally, no wheezing, rhonchi or rales. GI: soft, nontender, nondistended, + BS MS: no deformity or atrophy  Skin: warm and dry  Neuro: CNs II-XII intact, Strength and sensation are intact Psych: Normal affect   EKG: EKG is ordered today. It demonstrates: NSR, HR 82, normal axis, NSSTTW changes.   Recent Labs: 10/27/2014: ALT 13; TSH 0.56 09/04/2015: BUN 7; Creatinine, Ser 0.71; Hemoglobin 11.2*; Platelets 504*; Potassium 3.2*; Sodium 137    Lipid Panel  Labs (Brief)       Component Value Date/Time   CHOL 158 10/27/2014 1117   TRIG 57.0 10/27/2014 1117   HDL 38.60* 10/27/2014 1117   CHOLHDL 4 10/27/2014 1117   VLDL 11.4 10/27/2014 1117   LDLCALC 108* 10/27/2014 1117       ASSESSMENT AND PLAN:  1. Chest pain: Symptoms still sound atypical. But her constellation of symptoms have persisted and worsened and she is obviously quite  distressed about he symptoms. She is tearful in the office. I suspect anxiety is playing a large role. However, given her ongoing symptoms in the office, I think it is best to admit her to the hospital for observation. I reviewed this with Dr. Cristopher Harding who agreed.  - Admit to observation - Cycle cardiac enzymes - Check Echo - Plan inpatient ETT-Myoview in AM if rules out  2. Dizziness: Etiology not entirely clear. She's been taking meclizine with some relief. I suspect a lot of her symptoms may be related to anxiety. Will get Head CT without contrast. May need to get Neuro eval or PT/Vestibular eval in the hospital depending upon her symptoms.   3. Hypokalemia: K+ started yesterday. Will check BMET.   4. HTN: Controlled.    Medication Changes: Current medicines are reviewed at length with the patient today. Concerns regarding medicines are as outlined above. The following changes have been made:  Discontinued Medications   No medications on file  Modified Medications   No medications on file   New Prescriptions   No medications on file    Labs/ tests ordered today include:  Orders Placed This Encounter  Procedures  . Myocardial Perfusion Imaging  . EKG 12-Lead      Disposition:  Admit to [facility]. Her mother will drive her.    Signed, Versie Starks, MHS 09/08/2015 5:51 PM  [facility] [address], [place], [place] [address] Phone: [phone]; Fax: [phone]   Cardiology Attending  Patient seen and examined. Agree with the findings as noted above. The patient's exam as documented represents my findings as well. She has both typical and atypical chest pain. She will be admitted for observation as noted above.  Mikle Bosworth.D.

## 2015-09-16 ENCOUNTER — Encounter: Payer: Self-pay | Admitting: Internal Medicine

## 2015-09-16 ENCOUNTER — Ambulatory Visit (INDEPENDENT_AMBULATORY_CARE_PROVIDER_SITE_OTHER): Payer: 59 | Admitting: Internal Medicine

## 2015-09-16 ENCOUNTER — Other Ambulatory Visit (INDEPENDENT_AMBULATORY_CARE_PROVIDER_SITE_OTHER): Payer: 59

## 2015-09-16 VITALS — BP 122/76 | HR 100 | Temp 98.9°F | Resp 16 | Ht 68.0 in | Wt 177.1 lb

## 2015-09-16 DIAGNOSIS — E876 Hypokalemia: Secondary | ICD-10-CM

## 2015-09-16 DIAGNOSIS — E049 Nontoxic goiter, unspecified: Secondary | ICD-10-CM

## 2015-09-16 DIAGNOSIS — R739 Hyperglycemia, unspecified: Secondary | ICD-10-CM | POA: Diagnosis not present

## 2015-09-16 DIAGNOSIS — I10 Essential (primary) hypertension: Secondary | ICD-10-CM

## 2015-09-16 DIAGNOSIS — E010 Iodine-deficiency related diffuse (endemic) goiter: Secondary | ICD-10-CM

## 2015-09-16 DIAGNOSIS — F411 Generalized anxiety disorder: Secondary | ICD-10-CM

## 2015-09-16 LAB — BASIC METABOLIC PANEL
BUN: 13 mg/dL (ref 6–23)
CALCIUM: 9.8 mg/dL (ref 8.4–10.5)
CO2: 34 meq/L — AB (ref 19–32)
CREATININE: 0.77 mg/dL (ref 0.40–1.20)
Chloride: 100 mEq/L (ref 96–112)
GFR: 102.86 mL/min (ref >60.00)
Glucose, Bld: 113 mg/dL — ABNORMAL HIGH (ref 70–99)
Potassium: 3.4 mEq/L — ABNORMAL LOW (ref 3.5–5.1)
Sodium: 140 mEq/L (ref 135–145)

## 2015-09-16 LAB — HEMOGLOBIN A1C: Hgb A1c MFr Bld: 5.6 % (ref 4.6–6.5)

## 2015-09-16 LAB — T4: T4 TOTAL: 9.6 ug/dL (ref 4.5–12.0)

## 2015-09-16 LAB — TSH: TSH: 1.31 u[IU]/mL (ref 0.35–4.50)

## 2015-09-16 LAB — T4, FREE: FREE T4: 0.81 ng/dL (ref 0.60–1.60)

## 2015-09-16 LAB — MAGNESIUM: MAGNESIUM: 2 mg/dL (ref 1.5–2.5)

## 2015-09-16 LAB — T3, FREE: T3 FREE: 3 pg/mL (ref 2.3–4.2)

## 2015-09-16 LAB — T3 UPTAKE: T3 Uptake: 26 % (ref 22–35)

## 2015-09-16 MED ORDER — CLONAZEPAM 0.5 MG PO TABS: 0.5000 mg | ORAL_TABLET | Freq: Three times a day (TID) | ORAL | Status: DC | PRN

## 2015-09-16 MED ORDER — SERTRALINE HCL 50 MG PO TABS: 50.0000 mg | ORAL_TABLET | Freq: Every day | ORAL | Status: DC

## 2015-09-16 NOTE — Progress Notes (Signed)
Subjective:  Patient ID: Haley Harding, female    DOB: [birth year]  Age: 49 y.o. MRN: [id number]  CC: Depression   HPI Haley Harding presents for follow-up after recent admission for anxiety, chest pain and dizziness. She had an extensive cardiovascular workup and all was unremarkable with the exception that she was found to have an enlarged thyroid gland on the CT scan. She does report a lot of stressors in her life recently. She was placed on Cymbalta but says it caused a headache so she has not been taking an antidepressant.  Outpatient Prescriptions Prior to Visit  Medication Sig Dispense Refill  . acyclovir (ZOVIRAX) 800 MG tablet TAKE 1 TABLET BY MOUTH THREE TIMES A DAY 30 tablet 5  . amLODipine (NORVASC) 5 MG tablet Take 1 tablet (5 mg total) by mouth daily. 90 tablet 3  . ferrous sulfate 325 (65 FE) MG EC tablet Take 325 mg by mouth 3 (three) times daily with meals.    . hydrochlorothiazide (MICROZIDE) 12.5 MG capsule Take 1 capsule (12.5 mg total) by mouth daily. 90 capsule 4  . omeprazole (PRILOSEC) 20 MG capsule Take 1 capsule (20 mg total) by mouth daily. 30 capsule 1  . potassium chloride SA (K-DUR,KLOR-CON) 20 MEQ tablet Take 1 tablet (20 mEq total) by mouth daily. 90 tablet 3  . diphenhydrAMINE (BENADRYL) 25 MG tablet Take 25 mg by mouth every 6 (six) hours as needed for sleep.    . DULoxetine (CYMBALTA) 60 MG capsule TAKE 1 CAPSULE BY MOUTH DAILY. 90 capsule 3  . meclizine (ANTIVERT) 12.5 MG tablet Take 12.5 mg by mouth 3 (three) times daily as needed for dizziness.     No facility-administered medications prior to visit.    ROS Review of Systems  Constitutional: Negative.  Negative for fever, chills, diaphoresis, activity change, appetite change, fatigue and unexpected weight change.  HENT: Negative.   Eyes: Negative.   Respiratory: Negative.  Negative for cough, choking, chest tightness, shortness of breath and stridor.   Cardiovascular: Negative.  Negative for  chest pain, palpitations and leg swelling.  Gastrointestinal: Negative.  Negative for nausea, vomiting, abdominal pain, diarrhea, constipation and blood in stool.  Endocrine: Negative.   Genitourinary: Negative.  Negative for dysuria, hematuria and difficulty urinating.  Musculoskeletal: Negative.  Negative for myalgias, back pain and arthralgias.  Skin: Negative.  Negative for color change and rash.  Allergic/Immunologic: Negative.   Neurological: Negative.  Negative for dizziness, tremors, weakness, light-headedness, numbness and headaches.  Hematological: Negative.  Negative for adenopathy. Does not bruise/bleed easily.  Psychiatric/Behavioral: Positive for sleep disturbance and dysphoric mood. Negative for suicidal ideas, behavioral problems, confusion, self-injury, decreased concentration and agitation. The patient is nervous/anxious. The patient is not hyperactive.     Objective:  BP 122/76 mmHg  Pulse 100  Temp(Src) 98.9 F (37.2 C) (Oral)  Resp 16  Ht 5\' 8"  (1.727 m)  Wt 177 lb 2 oz (80.343 kg)  BMI 26.94 kg/m2  SpO2 97%  LMP 09/08/2015  BP Readings from Last 3 Encounters:  09/16/15 122/76  09/09/15 126/78  09/08/15 140/70    Wt Readings from Last 3 Encounters:  09/16/15 177 lb 2 oz (80.343 kg)  09/09/15 175 lb 3.2 oz (79.47 kg)  09/08/15 178 lb (80.74 kg)    Physical Exam  Constitutional: She is oriented to person, place, and time. No distress.  HENT:  Head: Normocephalic and atraumatic.  Mouth/Throat: Oropharynx is clear and moist. No oropharyngeal exudate.  Eyes: Conjunctivae are  normal. Right eye exhibits no discharge. Left eye exhibits no discharge. No scleral icterus.  Neck: Trachea normal and normal range of motion. Neck supple. No JVD present. No tracheal deviation, no edema, no erythema and normal range of motion present. No thyroid mass and no thyromegaly present.  Cardiovascular: Normal rate, normal heart sounds and intact distal pulses.   No murmur  heard. Pulmonary/Chest: Effort normal and breath sounds normal. No stridor. No respiratory distress. She has no wheezes. She has no rales. She exhibits no tenderness.  Abdominal: Soft. Bowel sounds are normal. She exhibits no distension and no mass. There is no tenderness. There is no rebound and no guarding.  Lymphadenopathy:    She has no cervical adenopathy.  Neurological: She is oriented to person, place, and time.  Skin: Skin is warm and dry. No rash noted. She is not diaphoretic. No erythema. No pallor.  Psychiatric: Her behavior is normal. Judgment and thought content normal. Her mood appears anxious. Her affect is not angry, not blunt, not labile and not inappropriate. Her speech is not rapid and/or pressured. She is not slowed and not withdrawn. Cognition and memory are normal. She does not exhibit a depressed mood. She expresses no homicidal and no suicidal ideation. She expresses no suicidal plans and no homicidal plans.  Vitals reviewed.   Lab Results  Component Value Date   WBC 7.9 09/08/2015   HGB 12.7 09/08/2015   HCT 39.5 09/08/2015   PLT 559* 09/08/2015   GLUCOSE 113* 09/16/2015   CHOL 134 09/09/2015   TRIG 79 09/09/2015   HDL 41 09/09/2015   LDLCALC 77 09/09/2015   ALT 13 10/27/2014   AST 14 10/27/2014   NA 140 09/16/2015   K 3.4* 09/16/2015   CL 100 09/16/2015   CREATININE 0.77 09/16/2015   BUN 13 09/16/2015   CO2 34* 09/16/2015   TSH 1.31 09/16/2015   HGBA1C 5.6 09/16/2015    Ct Angio Chest Pe W/cm &/or Wo Cm  09/08/2015   CLINICAL DATA:  Left upper chest pain, elevated D-dimer  EXAM: CT ANGIOGRAPHY CHEST WITH CONTRAST  TECHNIQUE: Multidetector CT imaging of the chest was performed using the standard protocol during bolus administration of intravenous contrast. Multiplanar CT image reconstructions and MIPs were obtained to evaluate the vascular anatomy.  CONTRAST:  66mL OMNIPAQUE IOHEXOL 350 MG/ML SOLN  COMPARISON:  Chest x-ray of 09/04/2015 and CT angio chest  of 10/07/2011  FINDINGS: The pulmonary arteries are well opacified. There is no evidence of acute pulmonary embolism. The thoracic aorta is not as well opacified but no acute abnormality is seen. No mediastinal or hilar adenopathy is seen. Mild cardiomegaly is stable. The thyroid gland remains somewhat asymmetrically prominent left lobe larger than right.  On lung window images, no lung infiltrate is seen and there is no evidence of pleural effusion. No suspicious lung nodule is noted. The central airway is patent. Minimally prominent axillary lymph nodes are present none of which are pathologically enlarged. The thoracic vertebrae are in normal alignment. No acute abnormality is seen.  Review of the MIP images confirms the above findings.  IMPRESSION: 1. Negative CT angiogram of the chest. No evidence of acute pulmonary embolism is seen. 2. Slight asymmetric prominence of the thyroid gland. Correlate clinically. Ultrasound may be helpful.   Electronically Signed   By: Ivar Drape M.D.   On: 09/08/2015 14:56   Nm Myocar Multi W/spect W/wall Motion / Ef  09/09/2015   CLINICAL DATA:  Chest pain.  Hypertension.  Dizziness.  EXAM: MYOCARDIAL IMAGING WITH SPECT (REST AND EXERCISE)  GATED LEFT VENTRICULAR WALL MOTION STUDY  LEFT VENTRICULAR EJECTION FRACTION  TECHNIQUE: Standard myocardial SPECT imaging was performed after resting intravenous injection of 10 mCi Tc-32m sestamibi. Subsequently, exercise tolerance test was performed by the patient under the supervision of the Cardiology staff. At peak-stress, 30 mCi Tc-6m sestamibi was injected intravenously and standard myocardial SPECT imaging was performed. Quantitative gated imaging was also performed to evaluate left ventricular wall motion, and estimate left ventricular ejection fraction.  COMPARISON:  09/08/2015 CT  FINDINGS: Perfusion: No decreased activity in the left ventricle on stress imaging to suggest reversible ischemia or infarction.  Wall Motion: Normal  left ventricular wall motion. No left ventricular dilation.  Left Ventricular Ejection Fraction: 69 %  End diastolic volume 58 ml  End systolic volume 18 ml  IMPRESSION: 1. No reversible ischemia or infarction.  2. Normal left ventricular wall motion.  3. Left ventricular ejection fraction 69%  4. Low-risk stress test findings*.  *2012 Appropriate Use Criteria for Coronary Revascularization Focused Update: J Am Coll Cardiol. 9390;30(0):923-300. http://content.airportbarriers.com.aspx?articleid=1201161   Electronically Signed   By: Dereck Ligas M.D.   On: 09/09/2015 14:30    Assessment & Plan:   Elsey was seen today for depression.  Diagnoses and all orders for this visit:  Hyperglycemia- she has prediabetes and will work on her lifestyle modifications. -     Basic metabolic panel; Future -     Hemoglobin A1c; Future  Hypokalemia- her potassium level remained slightly low, she will continue potassium replacement therapy. -     Basic metabolic panel; Future -     Magnesium; Future  Essential hypertension, benign- her blood pressure is adequately well controlled.  Thyromegaly- the thyroid exam to my palpation is normal today, I will get a thyroid ultrasound to get a better picture of the thyroid gland. Will also check her thyroid function test to see if she has any evidence of hyper or hypothyroidism, Graves' disease, or Hashimoto's thyroiditis. -     Thyroxine binding globulin; Future -     Thyroid peroxidase antibody; Future -     T3 uptake; Future -     T3, free; Future -     T4; Future -     T4, free; Future -     TSH; Future -     US Soft Tissue Head/Neck; Future  GAD (generalized anxiety disorder)- her recent events sound like they were panic attacks. I've asked her to start taking sertraline. She was also use Klonopin as needed for anxiety and panic. -     sertraline (ZOLOFT) 50 MG tablet; Take 1 tablet (50 mg total) by mouth daily. -     clonazePAM (KLONOPIN) 0.5 MG tablet;  Take 1 tablet (0.5 mg total) by mouth 3 (three) times daily as needed for anxiety.   I have discontinued Ms. Menchaca DULoxetine, diphenhydrAMINE, and meclizine. I am also having her start on sertraline and clonazePAM. Additionally, I am having her maintain her acyclovir, hydrochlorothiazide, amLODipine, ferrous sulfate, omeprazole, and potassium chloride SA.  Meds ordered this encounter  Medications  . sertraline (ZOLOFT) 50 MG tablet    Sig: Take 1 tablet (50 mg total) by mouth daily.    Dispense:  30 tablet    Refill:  3  . clonazePAM (KLONOPIN) 0.5 MG tablet    Sig: Take 1 tablet (0.5 mg total) by mouth 3 (three) times daily as needed for anxiety.    Dispense:  90 tablet    Refill:  2     Follow-up: Return in about 6 weeks (around 10/28/2015).  Scarlette Calico, MD

## 2015-09-16 NOTE — Patient Instructions (Signed)

## 2015-09-16 NOTE — Progress Notes (Signed)
Pre visit review using our clinic review tool, if applicable. No additional management support is needed unless otherwise documented below in the visit note. 

## 2015-09-17 LAB — THYROXINE BINDING GLOBULIN: TBG: 28.4 ug/mL (ref 13.5–30.9)

## 2015-09-17 LAB — THYROID PEROXIDASE ANTIBODY: Thyroperoxidase Ab SerPl-aCnc: <1 IU/mL (ref <9)

## 2015-09-18 ENCOUNTER — Other Ambulatory Visit: Payer: 59

## 2015-09-18 ENCOUNTER — Encounter: Payer: Self-pay | Admitting: Internal Medicine

## 2015-09-21 ENCOUNTER — Ambulatory Visit: Payer: 59 | Admitting: Physician Assistant

## 2015-09-22 ENCOUNTER — Other Ambulatory Visit (HOSPITAL_COMMUNITY): Payer: 59

## 2015-09-22 ENCOUNTER — Telehealth: Payer: Self-pay | Admitting: Internal Medicine

## 2015-09-22 ENCOUNTER — Encounter: Payer: 59 | Admitting: Nurse Practitioner

## 2015-09-22 NOTE — Telephone Encounter (Signed)
Please call patient with lab results

## 2015-09-23 NOTE — Telephone Encounter (Signed)
LMOVM informing of labs

## 2015-09-25 ENCOUNTER — Ambulatory Visit (HOSPITAL_COMMUNITY): Payer: 59

## 2015-09-25 ENCOUNTER — Ambulatory Visit (HOSPITAL_COMMUNITY)
Admission: RE | Admit: 2015-09-25 | Discharge: 2015-09-25 | Disposition: A | Discharge status: Home / Self Care | Payer: 59 | Source: Ambulatory Visit | Attending: Internal Medicine | Admitting: Internal Medicine

## 2015-09-25 DIAGNOSIS — E010 Iodine-deficiency related diffuse (endemic) goiter: Secondary | ICD-10-CM

## 2015-10-12 ENCOUNTER — Ambulatory Visit (HOSPITAL_COMMUNITY)
Admission: RE | Admit: 2015-10-12 | Discharge: 2015-10-12 | Disposition: A | Discharge status: Home / Self Care | Payer: 59 | Source: Ambulatory Visit | Attending: Internal Medicine | Admitting: Internal Medicine

## 2015-10-12 ENCOUNTER — Encounter: Payer: Self-pay | Admitting: Internal Medicine

## 2015-10-12 DIAGNOSIS — E079 Disorder of thyroid, unspecified: Secondary | ICD-10-CM | POA: Diagnosis not present

## 2015-10-12 DIAGNOSIS — E010 Iodine-deficiency related diffuse (endemic) goiter: Secondary | ICD-10-CM | POA: Diagnosis not present

## 2015-10-15 ENCOUNTER — Ambulatory Visit: Payer: 59 | Admitting: Physician Assistant

## 2015-10-28 ENCOUNTER — Ambulatory Visit: Payer: 59 | Admitting: Internal Medicine

## 2015-11-20 ENCOUNTER — Telehealth: Payer: Self-pay | Admitting: Internal Medicine

## 2015-11-20 ENCOUNTER — Other Ambulatory Visit: Payer: Self-pay | Admitting: Internal Medicine

## 2015-11-20 DIAGNOSIS — I10 Essential (primary) hypertension: Secondary | ICD-10-CM

## 2015-11-20 MED ORDER — ACYCLOVIR 800 MG PO TABS: 800.0000 mg | ORAL_TABLET | Freq: Three times a day (TID) | ORAL | Status: DC

## 2015-11-20 MED ORDER — HYDROCHLOROTHIAZIDE 12.5 MG PO CAPS: 12.5000 mg | ORAL_CAPSULE | Freq: Every day | ORAL | Status: DC

## 2015-11-20 MED ORDER — AMLODIPINE BESYLATE 5 MG PO TABS: 5.0000 mg | ORAL_TABLET | Freq: Every day | ORAL | Status: DC

## 2015-11-20 NOTE — Telephone Encounter (Signed)
Is also requesting script for amlodipine and hydrochlorothiazide

## 2015-11-20 NOTE — Telephone Encounter (Signed)
Pt requesting refill for acyclovir (ZOVIRAX) 800 MG tablet [id number] Pharmacy is [facility] on [address]

## 2016-02-15 ENCOUNTER — Other Ambulatory Visit: Payer: Self-pay

## 2016-02-15 DIAGNOSIS — F411 Generalized anxiety disorder: Secondary | ICD-10-CM

## 2016-02-15 MED ORDER — SERTRALINE HCL 50 MG PO TABS: 50.0000 mg | ORAL_TABLET | Freq: Every day | ORAL | Status: DC

## 2016-05-11 ENCOUNTER — Telehealth: Payer: Self-pay | Admitting: *Deleted

## 2016-05-11 DIAGNOSIS — I10 Essential (primary) hypertension: Secondary | ICD-10-CM

## 2016-05-11 DIAGNOSIS — F411 Generalized anxiety disorder: Secondary | ICD-10-CM

## 2016-05-11 MED ORDER — SERTRALINE HCL 50 MG PO TABS: 50.0000 mg | ORAL_TABLET | Freq: Every day | ORAL | Status: DC

## 2016-05-11 MED ORDER — AMLODIPINE BESYLATE 5 MG PO TABS: 5.0000 mg | ORAL_TABLET | Freq: Every day | ORAL | Status: DC

## 2016-05-11 MED ORDER — POTASSIUM CHLORIDE CRYS ER 20 MEQ PO TBCR: 20.0000 meq | EXTENDED_RELEASE_TABLET | Freq: Every day | ORAL | Status: DC

## 2016-05-11 MED ORDER — HYDROCHLOROTHIAZIDE 12.5 MG PO CAPS: 12.5000 mg | ORAL_CAPSULE | Freq: Every day | ORAL | Status: DC

## 2016-05-11 MED ORDER — OMEPRAZOLE 20 MG PO CPDR: 20.0000 mg | DELAYED_RELEASE_CAPSULE | Freq: Every day | ORAL | Status: DC

## 2016-05-11 NOTE — Telephone Encounter (Signed)
Left msg on triage stating she has change mail service to [facility]. Needing all medications sent to them.Sent all maintenance meds to [facility]....Haley Harding

## 2016-08-12 ENCOUNTER — Ambulatory Visit (INDEPENDENT_AMBULATORY_CARE_PROVIDER_SITE_OTHER)
Admission: RE | Admit: 2016-08-12 | Discharge: 2016-08-12 | Disposition: A | Discharge status: Home / Self Care | Payer: 59 | Source: Ambulatory Visit | Attending: Internal Medicine | Admitting: Internal Medicine

## 2016-08-12 ENCOUNTER — Telehealth: Payer: Self-pay

## 2016-08-12 ENCOUNTER — Ambulatory Visit (INDEPENDENT_AMBULATORY_CARE_PROVIDER_SITE_OTHER): Payer: 59 | Admitting: Internal Medicine

## 2016-08-12 ENCOUNTER — Encounter: Payer: Self-pay | Admitting: Internal Medicine

## 2016-08-12 VITALS — BP 138/78 | HR 85 | Resp 20 | Wt 164.0 lb

## 2016-08-12 DIAGNOSIS — J019 Acute sinusitis, unspecified: Secondary | ICD-10-CM

## 2016-08-12 DIAGNOSIS — F411 Generalized anxiety disorder: Secondary | ICD-10-CM | POA: Diagnosis not present

## 2016-08-12 DIAGNOSIS — R0789 Other chest pain: Secondary | ICD-10-CM

## 2016-08-12 MED ORDER — SERTRALINE HCL 100 MG PO TABS: 100.0000 mg | ORAL_TABLET | Freq: Every day | ORAL | 3 refills | Status: DC

## 2016-08-12 MED ORDER — LEVOFLOXACIN 500 MG PO TABS: 500.0000 mg | ORAL_TABLET | Freq: Every day | ORAL | 0 refills | Status: AC

## 2016-08-12 MED ORDER — LEVOFLOXACIN 500 MG PO TABS: 500.0000 mg | ORAL_TABLET | Freq: Every day | ORAL | 0 refills | Status: DC

## 2016-08-12 NOTE — Patient Instructions (Signed)
Please take all new medication as prescribed - the antibiotic  OK to increase the sertraline to 100 mg per day  Please continue all other medications as before, and refills have been done if requested.  Please have the pharmacy call with any other refills you may need.  Please keep your appointments with your specialists as you may have planned  Please go to the XRAY Department in the Basement (go straight as you get off the elevator) for the x-ray testing  You will be contacted by phone if any changes need to be made immediately.  Otherwise, you will receive a letter about your results with an explanation, but please check with MyChart first.  Please remember to sign up for MyChart if you have not done so, as this will be important to you in the future with finding out test results, communicating by private email, and scheduling acute appointments online when needed.

## 2016-08-12 NOTE — Progress Notes (Signed)
Subjective:    Patient ID: Haley Harding, female    DOB: 02-20-1966, 50 y.o.   MRN: [id number]  HPI   Here with c/o URI symptoms for 1 wk after exposure to sick grandchild,  Here with 5-6 days acute onset fever, facial pain, pressure, headache, general weakness and malaise, with mild ST and cough,   pt also with 2-3 days right pleuritic mild intermittent sharp chest pains, but no wheezing, increased sob or doe, orthopnea, PND, increased LE swelling, palpitations, dizziness or syncope.  Also  - Pt refuses klonopin due to seeing others get hooked on benzo in the past. Asks for increased sertraline, as has not been able to address with PCP.  Denies worsening depressive symptoms, suicidal ideation, or panic; has ongoing anxiety, with ongoing multiple stressors. Past Medical History:  Diagnosis Date  . Anemia   . Anxiety   . Chest pain 08/2015  . Depression   . Herpes genitalia   . Hypertension   . Iron deficiency anemia due to chronic blood loss   . Mass of uterus 10/07/2013  . Menorrhagia   . Uterine cyst    Past Surgical History:  Procedure Laterality Date  . ESOPHAGOGASTRODUODENOSCOPY N/A 01/24/2014   Procedure: ESOPHAGOGASTRODUODENOSCOPY (EGD);  Surgeon: Jerene Bears, MD;  Location: [facility];  Service: Gastroenterology;  Laterality: N/A;  . TUBAL LIGATION      reports that she has never smoked. She has never used smokeless tobacco. She reports that she does not drink alcohol or use drugs. family history includes Diabetes in her father; Hypertension in her father and mother. Allergies  Allergen Reactions  . Duloxetine     headache   Current Outpatient Prescriptions on File Prior to Visit  Medication Sig Dispense Refill  . acyclovir (ZOVIRAX) 800 MG tablet Take 1 tablet (800 mg total) by mouth 3 (three) times daily. 30 tablet 5  . amLODipine (NORVASC) 5 MG tablet Take 1 tablet (5 mg total) by mouth daily. 90 tablet 3  . amLODipine (NORVASC) 5 MG tablet Take 1 tablet (5 mg  total) by mouth daily. Yearly physical w/labs due in Sept must see md for refills 90 tablet 1  . ferrous sulfate 325 (65 FE) MG EC tablet Take 325 mg by mouth 3 (three) times daily with meals.    . hydrochlorothiazide (MICROZIDE) 12.5 MG capsule Take 1 capsule (12.5 mg total) by mouth daily. Yearly physical due in Sept must see MD for refills 90 capsule 1  . hydrochlorothiazide (MICROZIDE) 12.5 MG capsule Take 1 capsule (12.5 mg total) by mouth daily. Yearly physical due in Sept must see MD for refills 90 capsule 1  . omeprazole (PRILOSEC) 20 MG capsule Take 1 capsule (20 mg total) by mouth daily. Yearly physical  due in Sept must see md for refills 90 capsule 1  . potassium chloride SA (K-DUR,KLOR-CON) 20 MEQ tablet Take 1 tablet (20 mEq total) by mouth daily. Yearly physical due in Sept must see MD for refills 90 tablet 1   No current facility-administered medications on file prior to visit.     Review of Systems  Constitutional: Negative for unusual diaphoresis or night sweats HENT: Negative for ear swelling or discharge Eyes: Negative for worsening visual haziness  Respiratory: Negative for choking and stridor.   Gastrointestinal: Negative for distension or worsening eructation Genitourinary: Negative for retention or change in urine volume.  Musculoskeletal: Negative for other MSK pain or swelling Skin: Negative for color change and worsening wound Neurological: Negative  for tremors and numbness other than noted  Psychiatric/Behavioral: Negative for decreased concentration or agitation other than above       Objective:   Physical Exam BP 138/78   Pulse 85   Resp 20   Wt 164 lb (74.4 kg)   LMP 08/12/2016   SpO2 99%   BMI 24.94 kg/m  VS noted,  Constitutional: Pt appears in no apparent distress HENT: Head: NCAT.  Right Ear: External ear normal.  Left Ear: External ear normal.  Bilat tm's with mild erythema.  Max sinus areas mild tender.  Pharynx with mild erythema, no  exudate Eyes: . Pupils are equal, round, and reactive to light. Conjunctivae and EOM are normal Neck: Normal range of motion. Neck supple.  Cardiovascular: Normal rate and regular rhythm.   Pulmonary/Chest: Effort normal and breath sounds without rales or wheezing.  Abd:  Soft, NT, ND, + BS Neurological: Pt is alert. Not confused , motor grossly intact Skin: Skin is warm. No rash, no LE edema Psychiatric: Pt behavior is normal. No agitation.   MYOCARDIAL IMAGING WITH SPECT (REST AND EXERCISE) - 09-08-2015 IMPRESSION: 1. No reversible ischemia or infarction.  2. Normal left ventricular wall motion.  3. Left ventricular ejection fraction 69%  4. Low-risk stress test findings*.  Today ecg I personally reviewed Sinus  Rhythm  Low voltage in precordial leads.   -Decreasing R-wave progression -may be secondary to pulmonary disease   consider old anterior infarct.   -  Diffuse nonspecific T-abnormality.   ABNORMAL      Assessment & Plan:

## 2016-08-12 NOTE — Progress Notes (Signed)
Pre visit review using our clinic review tool, if applicable. No additional management support is needed unless otherwise documented below in the visit note. 

## 2016-08-12 NOTE — Assessment & Plan Note (Signed)
Mild to mod, for antibx course,  to f/u any worsening symptoms or concerns 

## 2016-08-12 NOTE — Assessment & Plan Note (Signed)
Exam benign, ecg reviewed, pleuritic in nature, very low suspicion for cardiac, for cxr

## 2016-08-12 NOTE — Assessment & Plan Note (Signed)
Ok to d/c klonopin as she will not take, ok to increase the sertraline to 100 qd, declines counseling referral, f/u with PCP

## 2016-08-12 NOTE — Telephone Encounter (Signed)
Medication refill sent to pharmacy  

## 2016-08-24 ENCOUNTER — Encounter: Payer: Self-pay | Admitting: Internal Medicine

## 2016-08-24 ENCOUNTER — Ambulatory Visit (INDEPENDENT_AMBULATORY_CARE_PROVIDER_SITE_OTHER): Payer: 59 | Admitting: Internal Medicine

## 2016-08-24 ENCOUNTER — Other Ambulatory Visit (INDEPENDENT_AMBULATORY_CARE_PROVIDER_SITE_OTHER): Payer: 59

## 2016-08-24 VITALS — BP 142/90 | HR 80 | Temp 98.3°F | Resp 16 | Ht 67.0 in | Wt 167.4 lb

## 2016-08-24 DIAGNOSIS — I10 Essential (primary) hypertension: Secondary | ICD-10-CM | POA: Diagnosis not present

## 2016-08-24 DIAGNOSIS — Z1231 Encounter for screening mammogram for malignant neoplasm of breast: Secondary | ICD-10-CM

## 2016-08-24 DIAGNOSIS — D500 Iron deficiency anemia secondary to blood loss (chronic): Secondary | ICD-10-CM

## 2016-08-24 DIAGNOSIS — Z0000 Encounter for general adult medical examination without abnormal findings: Secondary | ICD-10-CM

## 2016-08-24 DIAGNOSIS — D539 Nutritional anemia, unspecified: Secondary | ICD-10-CM

## 2016-08-24 LAB — COMPREHENSIVE METABOLIC PANEL
ALT: 9 U/L (ref 0–35)
AST: 13 U/L (ref 0–37)
Albumin: 4.1 g/dL (ref 3.5–5.2)
Alkaline Phosphatase: 94 U/L (ref 39–117)
BILIRUBIN TOTAL: 0.2 mg/dL (ref 0.2–1.2)
BUN: 11 mg/dL (ref 6–23)
CO2: 30 meq/L (ref 19–32)
CREATININE: 0.62 mg/dL (ref 0.40–1.20)
Calcium: 8.9 mg/dL (ref 8.4–10.5)
Chloride: 102 mEq/L (ref 96–112)
GFR: 131.57 mL/min (ref >60.00)
GLUCOSE: 93 mg/dL (ref 70–99)
Potassium: 3.5 mEq/L (ref 3.5–5.1)
Sodium: 137 mEq/L (ref 135–145)
TOTAL PROTEIN: 7.2 g/dL (ref 6.0–8.3)

## 2016-08-24 LAB — LIPID PANEL
CHOL/HDL RATIO: 3
Cholesterol: 151 mg/dL (ref 0–200)
HDL: 51.5 mg/dL (ref >39.00)
LDL Cholesterol: 85 mg/dL (ref 0–99)
NONHDL: 99.05
TRIGLYCERIDES: 68 mg/dL (ref 0.0–149.0)
VLDL: 13.6 mg/dL (ref 0.0–40.0)

## 2016-08-24 LAB — CBC WITH DIFFERENTIAL/PLATELET
BASOS ABS: 0.1 10*3/uL (ref 0.0–0.1)
Basophils Relative: 0.6 % (ref 0.0–3.0)
EOS ABS: 0.1 10*3/uL (ref 0.0–0.7)
Eosinophils Relative: 0.9 % (ref 0.0–5.0)
HCT: 28.4 % — ABNORMAL LOW (ref 36.0–46.0)
Hemoglobin: 9.3 g/dL — ABNORMAL LOW (ref 12.0–15.0)
LYMPHS ABS: 1.3 10*3/uL (ref 0.7–4.0)
Lymphocytes Relative: 15.3 % (ref 12.0–46.0)
MCHC: 32.6 g/dL (ref 30.0–36.0)
MCV: 77.8 fl — ABNORMAL LOW (ref 78.0–100.0)
MONO ABS: 0.5 10*3/uL (ref 0.1–1.0)
Monocytes Relative: 5.6 % (ref 3.0–12.0)
NEUTROS ABS: 6.7 10*3/uL (ref 1.4–7.7)
NEUTROS PCT: 77.6 % — AB (ref 43.0–77.0)
PLATELETS: 561 10*3/uL — AB (ref 150.0–400.0)
RBC: 3.65 Mil/uL — ABNORMAL LOW (ref 3.87–5.11)
RDW: 25.5 % — ABNORMAL HIGH (ref 11.5–15.5)
WBC: 8.7 10*3/uL (ref 4.0–10.5)

## 2016-08-24 LAB — IBC PANEL
IRON: 145 ug/dL (ref 42–145)
Saturation Ratios: 29.8 % (ref 20.0–50.0)
TRANSFERRIN: 347 mg/dL (ref 212.0–360.0)

## 2016-08-24 NOTE — Progress Notes (Signed)
Subjective:  Patient ID: Haley Harding, female    DOB: 1966-02-16  Age: 50 y.o. MRN: [id number]  CC: Annual Exam and Hypertension   HPI Haley Harding presents for a CPX.  She tells me her blood pressure at home has been well controlled on the combination of amlodipine and hydrochlorothiazide. She feels well today and offers no complaints. She is taking her iron replacement therapy and has had no recent episodes of fatigue or shortness of breath. Her last menstrual cycle was about a week ago and though she does have long, heavy cycles this recent cycle was not abnormal.  Outpatient Medications Prior to Visit  Medication Sig Dispense Refill  . acyclovir (ZOVIRAX) 800 MG tablet Take 1 tablet (800 mg total) by mouth 3 (three) times daily. 30 tablet 5  . amLODipine (NORVASC) 5 MG tablet Take 1 tablet (5 mg total) by mouth daily. 90 tablet 3  . ferrous sulfate 325 (65 FE) MG EC tablet Take 325 mg by mouth 3 (three) times daily with meals.    . hydrochlorothiazide (MICROZIDE) 12.5 MG capsule Take 1 capsule (12.5 mg total) by mouth daily. Yearly physical due in Sept must see MD for refills 90 capsule 1  . omeprazole (PRILOSEC) 20 MG capsule Take 1 capsule (20 mg total) by mouth daily. Yearly physical  due in Sept must see md for refills 90 capsule 1  . potassium chloride SA (K-DUR,KLOR-CON) 20 MEQ tablet Take 1 tablet (20 mEq total) by mouth daily. Yearly physical due in Sept must see MD for refills 90 tablet 1  . sertraline (ZOLOFT) 100 MG tablet Take 1 tablet (100 mg total) by mouth daily. 90 tablet 3  . amLODipine (NORVASC) 5 MG tablet Take 1 tablet (5 mg total) by mouth daily. Yearly physical w/labs due in Sept must see md for refills (Patient not taking: Reported on 08/24/2016) 90 tablet 1  . hydrochlorothiazide (MICROZIDE) 12.5 MG capsule Take 1 capsule (12.5 mg total) by mouth daily. Yearly physical due in Sept must see MD for refills (Patient not taking: Reported on 08/24/2016) 90 capsule 1    No facility-administered medications prior to visit.     ROS Review of Systems  Constitutional: Negative.  Negative for activity change, chills, diaphoresis, fatigue, fever and unexpected weight change.  HENT: Negative.  Negative for sore throat, trouble swallowing and voice change.   Eyes: Negative.  Negative for visual disturbance.  Respiratory: Negative.  Negative for cough, choking, chest tightness, shortness of breath and stridor.   Cardiovascular: Negative.  Negative for chest pain, palpitations and leg swelling.  Gastrointestinal: Negative.  Negative for abdominal pain, blood in stool, constipation, diarrhea, nausea and vomiting.  Endocrine: Negative.   Genitourinary: Negative.   Musculoskeletal: Negative.   Skin: Negative.   Allergic/Immunologic: Negative.   Neurological: Negative.  Negative for dizziness, tremors, facial asymmetry, weakness, light-headedness and numbness.  Hematological: Negative for adenopathy. Does not bruise/bleed easily.  Psychiatric/Behavioral: Negative.     Objective:  BP (!) 142/90   Pulse 80   Temp 98.3 F (36.8 C) (Oral)   Resp 16   Ht 5\' 7"  (1.702 m)   Wt 167 lb 6.4 oz (75.9 kg)   LMP 08/12/2016   SpO2 98%   BMI 26.22 kg/m   BP Readings from Last 3 Encounters:  08/24/16 (!) 142/90  08/12/16 138/78  09/16/15 122/76    Wt Readings from Last 3 Encounters:  08/24/16 167 lb 6.4 oz (75.9 kg)  08/12/16 164 lb (74.4  kg)  09/16/15 177 lb 2 oz (80.3 kg)    Physical Exam  Constitutional: She is oriented to person, place, and time. She appears well-developed and well-nourished. No distress.  HENT:  Mouth/Throat: Oropharynx is clear and moist. No oropharyngeal exudate.  Eyes: Conjunctivae are normal. Right eye exhibits no discharge. Left eye exhibits no discharge. No scleral icterus.  Neck: Normal range of motion. Neck supple. No JVD present. No tracheal deviation present. No thyromegaly present.  Cardiovascular: Normal rate, regular  rhythm, normal heart sounds and intact distal pulses.  Exam reveals no gallop and no friction rub.   No murmur heard. Pulmonary/Chest: Effort normal and breath sounds normal. No stridor. No respiratory distress. She has no wheezes. She has no rales. She exhibits no tenderness.  Abdominal: Soft. Bowel sounds are normal. She exhibits no distension and no mass. There is no tenderness. There is no rebound and no guarding.  Genitourinary:  Genitourinary Comments: Breast, GU, and rectal exams were deferred at her request. She will see her gynecologist within the next few months for these exams.  Musculoskeletal: Normal range of motion. She exhibits no edema, tenderness or deformity.  Lymphadenopathy:    She has no cervical adenopathy.  Neurological: She is oriented to person, place, and time.  Skin: Skin is warm and dry. No rash noted. She is not diaphoretic. No erythema. No pallor.  Vitals reviewed.   Lab Results  Component Value Date   WBC 8.7 08/24/2016   HGB 9.3 (L) 08/24/2016   HCT 28.4 (L) 08/24/2016   PLT 561.0 (H) 08/24/2016   GLUCOSE 93 08/24/2016   CHOL 151 08/24/2016   TRIG 68.0 08/24/2016   HDL 51.50 08/24/2016   LDLCALC 85 08/24/2016   ALT 9 08/24/2016   AST 13 08/24/2016   NA 137 08/24/2016   K 3.5 08/24/2016   CL 102 08/24/2016   CREATININE 0.62 08/24/2016   BUN 11 08/24/2016   CO2 30 08/24/2016   TSH 0.64 08/24/2016   HGBA1C 5.6 09/16/2015    Dg Chest 2 View  Result Date: 08/12/2016 CLINICAL DATA:  Headaches and sinus pressure and anterior chest pain for the past week; history of hypertension, nonsmoker. EXAM: CHEST  2 VIEW COMPARISON:  Chest x-ray of September 04, 2015 and CT scan of the chest of September 08, 2015. FINDINGS: The lungs are well-expanded and clear. The heart and pulmonary vascularity are normal. The mediastinum is normal in width. There is no pleural effusion. There is gentle dextrocurvature centered at approximately T11. IMPRESSION: There is no  active cardiopulmonary disease. Electronically Signed   By: David  Martinique M.D.   On: 08/12/2016 16:41    Assessment & Plan:   Haley Harding was seen today for annual exam and hypertension.  Diagnoses and all orders for this visit:  Routine general medical examination at a health care facility- exam completed, labs ordered and reviewed, she refused a flu vaccine today, she will see her GYN doctor (Dr. Harrington Challenger) for a follow-up Pap smear, mammogram ordered, patient education material was given. -     Lipid panel; Future -     Comprehensive metabolic panel; Future -     TSH; Future  Iron deficiency anemia due to chronic blood loss- I am level is normal but she is still anemic. We'll investigate further. -     CBC with Differential/Platelet; Future -     IBC panel; Future -     Ferritin; Future  Visit for screening mammogram -     MM  DIGITAL SCREENING BILATERAL; Future  Essential hypertension, benign- her blood pressures adequately well-controlled, electrolytes and renal function are stable.  Deficiency anemia- The iron deficiency has been adequately treated but she is still anemic and has an elevated platelet count, I've asked her to return for a recheck on the CBC, will screen for bone marrow failure with a reticulocyte count and will screen for B12 and folate deficiencies. -     CBC with Differential/Platelet; Future -     Reticulocytes; Future -     Vitamin B12; Future -     Folate; Future -     Methylmalonic acid, serum; Future   I am having Ms. Tatlock maintain her ferrous sulfate, amLODipine, acyclovir, hydrochlorothiazide, omeprazole, potassium chloride SA, and sertraline.  No orders of the defined types were placed in this encounter.    Follow-up: Return in about 4 months (around 12/24/2016).  Scarlette Calico, MD

## 2016-08-24 NOTE — Patient Instructions (Signed)
Hypertension Hypertension, commonly called high blood pressure, is when the force of blood pumping through your arteries is too strong. Your arteries are the blood vessels that carry blood from your heart throughout your body. A blood pressure reading consists of a higher number over a lower number, such as 110/72. The higher number (systolic) is the pressure inside your arteries when your heart pumps. The lower number (diastolic) is the pressure inside your arteries when your heart relaxes. Ideally you want your blood pressure below 120/80. Hypertension forces your heart to work harder to pump blood. Your arteries may become narrow or stiff. Having untreated or uncontrolled hypertension can cause heart attack, stroke, kidney disease, and other problems. RISK FACTORS Some risk factors for high blood pressure are controllable. Others are not.  Risk factors you cannot control include:   Race. You may be at higher risk if you are African American.  Age. Risk increases with age.  Gender. Men are at higher risk than women before age 45 years. After age 65, women are at higher risk than men. Risk factors you can control include:  Not getting enough exercise or physical activity.  Being overweight.  Getting too much fat, sugar, calories, or salt in your diet.  Drinking too much alcohol. SIGNS AND SYMPTOMS Hypertension does not usually cause signs or symptoms. Extremely high blood pressure (hypertensive crisis) may cause headache, anxiety, shortness of breath, and nosebleed. DIAGNOSIS To check if you have hypertension, your health care provider will measure your blood pressure while you are seated, with your arm held at the level of your heart. It should be measured at least twice using the same arm. Certain conditions can cause a difference in blood pressure between your right and left arms. A blood pressure reading that is higher than normal on one occasion does not mean that you need treatment. If  it is not clear whether you have high blood pressure, you may be asked to return on a different day to have your blood pressure checked again. Or, you may be asked to monitor your blood pressure at home for 1 or more weeks. TREATMENT Treating high blood pressure includes making lifestyle changes and possibly taking medicine. Living a healthy lifestyle can help lower high blood pressure. You may need to change some of your habits. Lifestyle changes may include:  Following the DASH diet. This diet is high in fruits, vegetables, and whole grains. It is low in salt, red meat, and added sugars.  Keep your sodium intake below 2,300 mg per day.  Getting at least 30-45 minutes of aerobic exercise at least 4 times per week.  Losing weight if necessary.  Not smoking.  Limiting alcoholic beverages.  Learning ways to reduce stress. Your health care provider may prescribe medicine if lifestyle changes are not enough to get your blood pressure under control, and if one of the following is true:  You are 18-59 years of age and your systolic blood pressure is above 140.  You are 60 years of age or older, and your systolic blood pressure is above 150.  Your diastolic blood pressure is above 90.  You have diabetes, and your systolic blood pressure is over 140 or your diastolic blood pressure is over 90.  You have kidney disease and your blood pressure is above 140/90.  You have heart disease and your blood pressure is above 140/90. Your personal target blood pressure may vary depending on your medical conditions, your age, and other factors. HOME CARE INSTRUCTIONS    Have your blood pressure rechecked as directed by your health care provider.   Take medicines only as directed by your health care provider. Follow the directions carefully. Blood pressure medicines must be taken as prescribed. The medicine does not work as well when you skip doses. Skipping doses also puts you at risk for  problems.  Do not smoke.   Monitor your blood pressure at home as directed by your health care provider. SEEK MEDICAL CARE IF:   You think you are having a reaction to medicines taken.  You have recurrent headaches or feel dizzy.  You have swelling in your ankles.  You have trouble with your vision. SEEK IMMEDIATE MEDICAL CARE IF:  You develop a severe headache or confusion.  You have unusual weakness, numbness, or feel faint.  You have severe chest or abdominal pain.  You vomit repeatedly.  You have trouble breathing. MAKE SURE YOU:   Understand these instructions.  Will watch your condition.  Will get help right away if you are not doing well or get worse.   This information is not intended to replace advice given to you by your health care provider. Make sure you discuss any questions you have with your health care provider.   Document Released: 12/05/2005 Document Revised: 04/21/2015 Document Reviewed: 09/27/2013 Elsevier Interactive Patient Education 2016 [employer]  

## 2016-08-24 NOTE — Progress Notes (Signed)
Pre visit review using our clinic review tool, if applicable. No additional management support is needed unless otherwise documented below in the visit note. 

## 2016-08-25 ENCOUNTER — Encounter: Payer: Self-pay | Admitting: Internal Medicine

## 2016-08-25 ENCOUNTER — Telehealth: Payer: Self-pay | Admitting: Internal Medicine

## 2016-08-25 LAB — TSH: TSH: 0.64 u[IU]/mL (ref 0.35–4.50)

## 2016-08-25 LAB — FERRITIN: Ferritin: 30.5 ng/mL (ref 10.0–291.0)

## 2016-08-31 ENCOUNTER — Telehealth: Payer: Self-pay | Admitting: Internal Medicine

## 2016-08-31 NOTE — Telephone Encounter (Signed)
Patient called regarding labs entered on 08/25/2016. She is stating that she wanted the 08/24/2016 blood draw to be used for the labs entered on 08/25/2016. I advised that it appears that they did not process the labs that way. Please check into this and notify the patient

## 2016-09-01 NOTE — Telephone Encounter (Signed)
Pt called back and stated that she spoke to me. I do not have any documentation that I spoke to her on the 7th or the 8th nor did I have a previous phone note sent to with the pt request. I informed pt that she did not speak to me and that there were no notes that she called. She was very upset and asked to speak to the Manager. I transferred to Julie's extension and gave that direct extension to pt before transfereing.

## 2016-09-01 NOTE — Telephone Encounter (Signed)
LVM for pt to call back as soon as possible.   

## 2016-09-15 ENCOUNTER — Telehealth: Payer: Self-pay | Admitting: Internal Medicine

## 2016-09-15 NOTE — Telephone Encounter (Signed)
Pt is still upset about the extra draw fee. She is still upset about (see previous phone note). She stated that she left a message with Almyra Free and Aaron Edelman with no return call. She stated that she can see how much [place] cares about [place]. She went over the same information previous documented in the previous note. She stated that she wanted to talk to PCP since he left the message. 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

## 2016-09-15 NOTE — Telephone Encounter (Signed)
Patient is requesting call back to go over B12.

## 2016-10-05 ENCOUNTER — Telehealth: Payer: Self-pay

## 2016-10-05 DIAGNOSIS — R921 Mammographic calcification found on diagnostic imaging of breast: Secondary | ICD-10-CM

## 2016-10-05 NOTE — Telephone Encounter (Signed)
LVM for pt to call back as soon as possible.   

## 2016-10-05 NOTE — Telephone Encounter (Signed)
[facility] called with the following request.   Order for screening mammogram changed to Diagnostic because pt did not return for 6 mo follow up after calcification where found in right breast at the 2013 mammogram.   This has been ordered.

## 2016-10-05 NOTE — Telephone Encounter (Signed)
LVM for pt to call back as soon as possible.   RE: please get me if she calls back.

## 2016-10-06 ENCOUNTER — Encounter: Payer: Self-pay | Admitting: Internal Medicine

## 2016-10-06 NOTE — Telephone Encounter (Signed)
noted 

## 2016-10-06 NOTE — Telephone Encounter (Signed)
Pt called back. Pt is not coming back to our office.  Order deleted. PCP removed.  Forwarding to [facility] in referrals as well.

## 2016-10-06 NOTE — Progress Notes (Signed)
done

## 2016-10-07 ENCOUNTER — Telehealth: Payer: Self-pay | Admitting: Internal Medicine

## 2016-10-07 NOTE — Telephone Encounter (Signed)
Patient dismissed from [facility] by Scarlette Calico MD , effective October 06, 2016. Dismissal letter sent out by certified / registered mail. DAJ

## 2016-10-11 ENCOUNTER — Other Ambulatory Visit: Payer: Self-pay | Admitting: Internal Medicine

## 2016-10-11 ENCOUNTER — Encounter: Payer: Self-pay | Admitting: Internal Medicine

## 2016-10-31 ENCOUNTER — Other Ambulatory Visit: Payer: Self-pay | Admitting: Internal Medicine

## 2016-12-21 NOTE — Telephone Encounter (Signed)
Certified dismissal letter returned as undeliverable, unclaimed, return to sender after three attempts by [employer] on December 21, 2016 Letter placed in another envelope and resent as 1st class mail which does not require a signature. DAJ

## 2017-12-27 ENCOUNTER — Telehealth: Payer: Self-pay | Admitting: Hematology and Oncology

## 2017-12-27 NOTE — Telephone Encounter (Signed)
Spoke with patient regarding D/T/Loc/Ph# for appointment.  Also called referring MD's office and left message for [facility] with appt info as well.

## 2018-01-08 ENCOUNTER — Telehealth: Payer: Self-pay | Admitting: Hematology

## 2018-01-08 ENCOUNTER — Inpatient Hospital Stay: Payer: BLUE CROSS/BLUE SHIELD

## 2018-01-08 ENCOUNTER — Inpatient Hospital Stay: Payer: BLUE CROSS/BLUE SHIELD | Attending: Hematology and Oncology | Admitting: Hematology

## 2018-01-08 VITALS — BP 150/97 | HR 102 | Temp 97.8°F | Resp 18 | Ht 67.0 in | Wt 169.0 lb

## 2018-01-08 DIAGNOSIS — D75839 Thrombocytosis, unspecified: Secondary | ICD-10-CM

## 2018-01-08 DIAGNOSIS — D500 Iron deficiency anemia secondary to blood loss (chronic): Secondary | ICD-10-CM | POA: Insufficient documentation

## 2018-01-08 DIAGNOSIS — N920 Excessive and frequent menstruation with regular cycle: Secondary | ICD-10-CM | POA: Diagnosis not present

## 2018-01-08 DIAGNOSIS — D473 Essential (hemorrhagic) thrombocythemia: Secondary | ICD-10-CM

## 2018-01-08 DIAGNOSIS — I10 Essential (primary) hypertension: Secondary | ICD-10-CM | POA: Diagnosis not present

## 2018-01-08 LAB — CBC WITH DIFFERENTIAL (CANCER CENTER ONLY)
BASOS ABS: 0.1 10*3/uL (ref 0.0–0.1)
BASOS PCT: 2 %
EOS ABS: 0.1 10*3/uL (ref 0.0–0.5)
EOS PCT: 1 %
HCT: 33.8 % — ABNORMAL LOW (ref 34.8–46.6)
Hemoglobin: 10.4 g/dL — ABNORMAL LOW (ref 11.6–15.9)
Lymphocytes Relative: 21 %
Lymphs Abs: 1.3 10*3/uL (ref 0.9–3.3)
MCH: 25.7 pg (ref 25.1–34.0)
MCHC: 30.8 g/dL — ABNORMAL LOW (ref 31.5–36.0)
MCV: 83.5 fL (ref 79.5–101.0)
MONO ABS: 0.3 10*3/uL (ref 0.1–0.9)
Monocytes Relative: 5 %
Neutro Abs: 4.5 10*3/uL (ref 1.5–6.5)
Neutrophils Relative %: 71 %
PLATELETS: 456 10*3/uL — AB (ref 145–400)
RBC: 4.05 MIL/uL (ref 3.70–5.45)
RDW: 15.8 % (ref 11.2–16.1)
WBC: 6.3 10*3/uL (ref 3.9–10.3)

## 2018-01-08 LAB — CMP (CANCER CENTER ONLY)
ALBUMIN: 3.7 g/dL (ref 3.5–5.0)
ALK PHOS: 112 U/L (ref 40–150)
ALT: 11 U/L (ref 0–55)
AST: 13 U/L (ref 5–34)
Anion gap: 8 (ref 3–11)
BILIRUBIN TOTAL: 0.3 mg/dL (ref 0.2–1.2)
BUN: 9 mg/dL (ref 7–26)
CALCIUM: 9.1 mg/dL (ref 8.4–10.4)
CO2: 25 mmol/L (ref 22–29)
CREATININE: 0.77 mg/dL (ref 0.60–1.10)
Chloride: 107 mmol/L (ref 98–109)
GFR, Est AFR Am: >60 mL/min (ref >60)
GLUCOSE: 98 mg/dL (ref 70–140)
POTASSIUM: 3.9 mmol/L (ref 3.3–4.7)
Sodium: 140 mmol/L (ref 136–145)
TOTAL PROTEIN: 7.4 g/dL (ref 6.4–8.3)

## 2018-01-08 LAB — IRON AND TIBC
IRON: 19 ug/dL — AB (ref 41–142)
SATURATION RATIOS: 5 % — AB (ref 21–57)
TIBC: 420 ug/dL (ref 236–444)
UIBC: 401 ug/dL

## 2018-01-08 LAB — RETICULOCYTES
RBC.: 4.05 MIL/uL (ref 3.70–5.45)
RETIC CT PCT: 2 % (ref 0.7–2.1)
Retic Count, Absolute: 81 10*3/uL (ref 33.7–90.7)

## 2018-01-08 LAB — FERRITIN: Ferritin: 21 ng/mL (ref 9–269)

## 2018-01-08 MED ORDER — POLYSACCHARIDE IRON COMPLEX 150 MG PO CAPS: 150.0000 mg | ORAL_CAPSULE | Freq: Every day | ORAL | 3 refills | Status: DC

## 2018-01-08 NOTE — Telephone Encounter (Signed)
Gave avs and calendar for march °

## 2018-01-08 NOTE — Progress Notes (Signed)
CONSULT NOTE  Patient Care Team: Damaris Hippo, MD as PCP - General (Family Medicine)  CHIEF COMPLAINTS/PURPOSE OF CONSULTATION:  Elevated platelet count  HISTORY OF PRESENTING ILLNESS:   Haley Harding 52 y.o. female is here because of high platelet count for a few years and has been referred to [place] by her PCP Dr. Inda Castle.    The pt reports that she is doing well overall. The pt notes heavier periods lasting up to 2 weeks.  The pt notes cysts in her endometrium and fibroids. The pt notes hysterectomy has been advised though the associated pain makes her not want to move forward with that. The biopsy from her abnormal pap smear 4 years ago were normal. Dr. Lonia Blood (OBGYN) informed the pt that she had abnormal fibroids.  Pt takes Nature's Own (Ferrous sulfate 325mg ) for her iron replacement and notes that energy levels decrease when she misses taking them. She also notes that sometimes the Ferrous sulfate 325mg  iron pills bother her stomach and is able to take them about 1 every other day. Pt reports Pica cravings for ice which she consumes about 4 cups q day.   Platelet count as high as 683k on 09/22/17, and on 08/23/17 it was 634k, and on 08/25/16 it was 561k.  On PMHx the pt has had anemia and has taken iron replacement for about 5 years. Three years ago the pt received an iron infusion.The pt denies any other blood disorders, MI or DVT. The pt notes anemia.The pt denies any Hx of blood clots.  The pt denies smoking and ETOH consumption. The pt notes FHx of her mother and grandmother going through menopause at age 73.   No personal ot Fhx of VTE.  On review of systems, pt reports abdominal cramping every other period.   MEDICAL HISTORY:  Past Medical History:  Diagnosis Date  . Anemia   . Anxiety   . Chest pain 08/2015  . Depression   . Herpes genitalia   . Hypertension   . Iron deficiency anemia due to chronic blood loss   . Mass of uterus 10/07/2013  . Menorrhagia   . Uterine  cyst     SURGICAL HISTORY: Past Surgical History:  Procedure Laterality Date  . ESOPHAGOGASTRODUODENOSCOPY N/A 01/24/2014   Procedure: ESOPHAGOGASTRODUODENOSCOPY (EGD);  Surgeon: Jerene Bears, MD;  Location: [facility];  Service: Gastroenterology;  Laterality: N/A;  . TUBAL LIGATION      SOCIAL HISTORY: Social History   Socioeconomic History  . Marital status: Divorced    Spouse name: Not on file  . Number of children: 3  . Years of education: Not on file  . Highest education level: Not on file  Social Needs  . Financial resource strain: Not on file  . Food insecurity - worry: Not on file  . Food insecurity - inability: Not on file  . Transportation needs - medical: Not on file  . Transportation needs - non-medical: Not on file  Occupational History  . Occupation: CLINICAL ASSISTANT    Employer: [employer]  Tobacco Use  . Smoking status: Never Smoker  . Smokeless tobacco: Never Used  Substance and Sexual Activity  . Alcohol use: No  . Drug use: No  . Sexual activity: Not Currently  Other Topics Concern  . Not on file  Social History Narrative   Divorced, one son to daughters   Sleep lab coordinator [facility]   One caffeinated beverage daily   10/07/2013  FAMILY HISTORY: Family History  Problem Relation Age of Onset  . Hypertension Mother   . Hypertension Father   . Diabetes Father   . Cancer Neg Hx   . Alcohol abuse Neg Hx   . Early death Neg Hx   . Hearing loss Neg Hx   . Heart disease Neg Hx   . Hyperlipidemia Neg Hx   . Kidney disease Neg Hx   . Stroke Neg Hx     ALLERGIES:  is allergic to duloxetine.  MEDICATIONS:  Current Outpatient Medications  Medication Sig Dispense Refill  . acyclovir (ZOVIRAX) 800 MG tablet Take 1 tablet (800 mg total) by mouth 3 (three) times daily. 30 tablet 5  . amLODipine (NORVASC) 5 MG tablet Take 1 tablet (5 mg total) by mouth daily. 90 tablet 3  . ferrous sulfate  325 (65 FE) MG EC tablet Take 325 mg by mouth 3 (three) times daily with meals.    . hydrochlorothiazide (MICROZIDE) 12.5 MG capsule Take 1 capsule (12.5 mg total) by mouth daily. Yearly physical due in Sept must see MD for refills 90 capsule 1  . iron polysaccharides (NIFEREX) 150 MG capsule Take 1 capsule (150 mg total) by mouth daily. 60 capsule 3  . omeprazole (PRILOSEC) 20 MG capsule Take 1 capsule (20 mg total) by mouth daily. Yearly physical  due in Sept must see md for refills 90 capsule 1  . potassium chloride SA (K-DUR,KLOR-CON) 20 MEQ tablet Take 1 tablet (20 mEq total) by mouth daily. Yearly physical due in Sept must see MD for refills 90 tablet 1  . sertraline (ZOLOFT) 100 MG tablet Take 1 tablet (100 mg total) by mouth daily. 90 tablet 3   No current facility-administered medications for this visit.     REVIEW OF SYSTEMS:   Constitutional: Denies fevers, chills or abnormal night sweats Eyes: Denies blurriness of vision, double vision or watery eyes Ears, nose, mouth, throat, and face: Denies mucositis or sore throat Respiratory: Denies cough, dyspnea or wheezes Cardiovascular: Denies palpitation, chest discomfort or lower extremity swelling Gastrointestinal:  Denies nausea, heartburn or change in bowel habits Skin: Denies abnormal skin rashes Lymphatics: Denies new lymphadenopathy or easy bruising Neurological:Denies numbness, tingling or new weaknesses Behavioral/Psych: Mood is stable, no new changes  All other systems were reviewed with the patient and are negative.  PHYSICAL EXAMINATION:  Vitals:   01/08/18 0853 01/08/18 0854  BP: (!) 153/96 (!) 150/97  Pulse: (!) 102   Resp: 18   Temp: 97.8 F (36.6 C)   SpO2: 100%    Filed Weights   01/08/18 0853  Weight: 169 lb (76.7 kg)    GENERAL:alert, no distress and comfortable SKIN: skin color, texture, turgor are normal, no rashes or significant lesions EYES: normal, conjunctival pallor and non-injected, sclera  clear OROPHARYNX:no exudate, no erythema and lips, buccal mucosa, and tongue normal  NECK: supple, thyroid normal size, non-tender, without nodularity LYMPH:  no palpable lymphadenopathy in the cervical, axillary or inguinal LUNGS: clear to auscultation and percussion with normal breathing effort HEART: regular rate & rhythm and no murmurs and no lower extremity edema ABDOMEN:abdomen soft and normal bowel sounds.  Mild TTP in lower abdomen. Musculoskeletal:no cyanosis of digits and no clubbing  PSYCH: alert & oriented x 3 with fluent speech NEURO: no focal motor/sensory deficits  LABORATORY DATA:  I have reviewed the data as listed  . CBC Latest Ref Rng & Units 01/08/2018 08/24/2016 09/08/2015  WBC 3.9 - 10.3 K/uL 6.3 8.7 7.9  Hemoglobin 12.0 - 15.0 g/dL - 9.3(L) 12.7  Hematocrit 34.8 - 46.6 % 33.8(L) 28.4(L) 39.5  Platelets 145 - 400 K/uL 456(H) 561.0(H) 559(H)  hgb 10.4  . Lab Results  Component Value Date   RETICCTPCT 2.0 01/08/2018   RBC 4.05 01/08/2018   RBC 4.05 01/08/2018      . CMP Latest Ref Rng & Units 01/08/2018 08/24/2016 09/16/2015  Glucose 70 - 140 mg/dL 98 93 113(H)  BUN 7 - 26 mg/dL 9 11 13   Creatinine 0.40 - 1.20 mg/dL - 0.62 0.77  Sodium 136 - 145 mmol/L 140 137 140  Potassium 3.3 - 4.7 mmol/L 3.9 3.5 3.4(L)  Chloride 98 - 109 mmol/L 107 102 100  CO2 22 - 29 mmol/L 25 30 34(H)  Calcium 8.4 - 10.4 mg/dL 9.1 8.9 9.8  Total Protein 6.4 - 8.3 g/dL 7.4 7.2 -  Total Bilirubin 0.2 - 1.2 mg/dL 0.3 0.2 -  Alkaline Phos 40 - 150 U/L 112 94 -  AST 5 - 34 U/L 13 13 -  ALT 0 - 55 U/L 11 9 -   Clonal mutation studies pending.  OSH      RADIOGRAPHIC STUDIES: I have personally reviewed the radiological images as listed and agreed with the findings in the report. No results found.  ASSESSMENT & PLAN:   52 y.o. is a female with iron deficiency anemia and menorrhagia with    1. Thrombocytosis. ?reactive due to ongoing significant menorrhagia with Iron  deficiency vs Clonal thrombocytosis (Essential thrombocytosis). Burtis Junes the former.  2. Iron deficiency Anemia due to menorrhagia  3. Intolerance to PO iron  4. Pica symptoms from Iron deficiency  PLAN  -Discussed with the patient today regarding her recent labs with regarding to findings of significantly elevated platelets and irion deficiency. -Discussed that her elevated platelet count is likely reactive due to heavy menses -If bone marrow were implicated as cause, treatment to keep platelet count beneath 400k would be recommended and discussed in further detail at next visit following labs.  -Jak2 V617F/CALR/MPN mutation testing. -given iron intolerance , significant Iron deficiency and significant ongoing menorrhagia did offer patient option to pursue IV Iron. -patient provided informed consent for IV Iron -Will prescribe 150 mg iron-polysaccharide BID as a maintenance therapy in the interim.  -Advised the patient to follow up with her Gynecologist regarding other treatment options for menorrhagia besides surgery.  -all of the patient multiplel questions were answered in details.  5. . Patient Active Problem List   Diagnosis Date Noted  . Hypokalemia 09/16/2015  . Hyperglycemia 09/16/2015  . Thyromegaly 09/16/2015  . GAD (generalized anxiety disorder)   . Deficiency anemia 07/27/2012  . Iron deficiency anemia due to chronic blood loss 07/27/2012  . Visit for screening mammogram 07/27/2012  . Routine general medical examination at a health care facility 07/27/2012  . Essential hypertension, benign 10/26/2011  . GENITAL HERPES 03/24/2010   -continue f/u with PCP for other chronic medical concenrs.   Labs today IV Injectafer weekly x 2 doses RTC with Dr Irene Limbo in 6 weeks with labs   All questions were answered. The patient knows to call the clinic with any problems, questions or concerns. I spent 45 minutes counseling the patient face to face. The total time spent in the  appointment was 60 minutes and more than 50% was on counseling.   Brunetta Genera MD  This document serves as a record of services personally performed by Sullivan Lone, MD. It was created on his  behalf by Baldwin Jamaica, a trained medical scribe. The creation of this record is based on the scribe's personal observations and the provider's statements to them.   .I have reviewed the above documentation for accuracy and completeness, and I agree with the above. Brunetta Genera MD MS

## 2018-01-16 ENCOUNTER — Encounter: Payer: 59 | Admitting: Hematology and Oncology

## 2018-01-16 ENCOUNTER — Inpatient Hospital Stay: Payer: BLUE CROSS/BLUE SHIELD

## 2018-01-16 VITALS — BP 129/73 | HR 76 | Temp 98.6°F | Resp 16

## 2018-01-16 DIAGNOSIS — I10 Essential (primary) hypertension: Secondary | ICD-10-CM | POA: Diagnosis not present

## 2018-01-16 DIAGNOSIS — N920 Excessive and frequent menstruation with regular cycle: Secondary | ICD-10-CM | POA: Diagnosis not present

## 2018-01-16 DIAGNOSIS — D500 Iron deficiency anemia secondary to blood loss (chronic): Secondary | ICD-10-CM | POA: Diagnosis not present

## 2018-01-16 DIAGNOSIS — D473 Essential (hemorrhagic) thrombocythemia: Secondary | ICD-10-CM | POA: Diagnosis not present

## 2018-01-16 MED ORDER — SODIUM CHLORIDE 0.9 % IV SOLN
750.0000 mg | Freq: Once | INTRAVENOUS | Status: AC
  Administered 2018-01-16: 750 mg via INTRAVENOUS
  Filled 2018-01-16: qty 15

## 2018-01-16 NOTE — Patient Instructions (Addendum)
Ferric carboxymaltose injection What is this medicine? FERRIC CARBOXYMALTOSE (ferr-ik car-box-ee-mol-toes) is an iron complex. Iron is used to make healthy red blood cells, which carry oxygen and nutrients throughout the body. This medicine is used to treat anemia in people with chronic kidney disease or people who cannot take iron by mouth. This medicine may be used for other purposes; ask your health care provider or pharmacist if you have questions. COMMON BRAND NAME(S): Injectafer What should I tell my health care provider before I take this medicine? They need to know if you have any of these conditions: -anemia not caused by low iron levels -high levels of iron in the blood -liver disease -an unusual or allergic reaction to iron, other medicines, foods, dyes, or preservatives -pregnant or trying to get pregnant -breast-feeding How should I use this medicine? This medicine is for infusion into a vein. It is given by a health care professional in a hospital or clinic setting. Talk to your pediatrician regarding the use of this medicine in children. Special care may be needed. Overdosage: If you think you have taken too much of this medicine contact a poison control center or emergency room at once. NOTE: This medicine is only for you. Do not share this medicine with others. What if I miss a dose? It is important not to miss your dose. Call your doctor or health care professional if you are unable to keep an appointment. What may interact with this medicine? Do not take this medicine with any of the following medications: -deferoxamine -dimercaprol -other iron products This medicine may also interact with the following medications: -chloramphenicol -deferasirox This list may not describe all possible interactions. Give your health care provider a list of all the medicines, herbs, non-prescription drugs, or dietary supplements you use. Also tell them if you smoke, drink alcohol, or use  illegal drugs. Some items may interact with your medicine. What should I watch for while using this medicine? Visit your doctor or health care professional regularly. Tell your doctor if your symptoms do not start to get better or if they get worse. You may need blood work done while you are taking this medicine. You may need to follow a special diet. Talk to your doctor. Foods that contain iron include: whole grains/cereals, dried fruits, beans, or peas, leafy green vegetables, and organ meats (liver, kidney). What side effects may I notice from receiving this medicine? Side effects that you should report to your doctor or health care professional as soon as possible: -allergic reactions like skin rash, itching or hives, swelling of the face, lips, or tongue -breathing problems -changes in blood pressure -feeling faint or lightheaded, falls -flushing, sweating, or hot feelings Side effects that usually do not require medical attention (report to your doctor or health care professional if they continue or are bothersome): -changes in taste -constipation -dizziness -headache -nausea -pain, redness, or irritation at site where injected -vomiting This list may not describe all possible side effects. Call your doctor for medical advice about side effects. You may report side effects to FDA at 1-800-FDA-1088. Where should I keep my medicine? This drug is given in a hospital or clinic and will not be stored at home. NOTE: This sheet is a summary. It may not cover all possible information. If you have questions about this medicine, talk to your doctor, pharmacist, or health care provider.  2018 Elsevier/Gold Standard (2016-01-07 11:20:47)  2018 Elsevier/Gold Standard (2016-01-07 12:41:49)

## 2018-01-17 ENCOUNTER — Telehealth: Payer: Self-pay

## 2018-01-17 NOTE — Telephone Encounter (Addendum)
Pt called this AM stating that she does not want to get her next IV iron treatment. She said, "I know this is a different kind of iron than I have had before, but it has never hurt like this. My hand started hurting during the infusion yesterday, and the nurse put a heat pack on the hand and arm. But it is still hurting today, and my wrist especially."  Spoke with Dr. Irene Limbo who said that it is the pt's choice if she would prefer to take the PO iron instead of IV. Pt already has a current prescription. Appt cancelled on 2/5. MD aware of pt decision.   Lab work ordered and sent out for JAK2 (V617F and [employer] 12), MPL, and CALR. [employer]) denied labs other than the JAK2-V617F as this lab must be negative before the other labs will be deemed, "medically necessary." Confirmed this information with Orson Eva in prior authorization at [facility] and Mortimer Fries, Set designer at [facility].   Spoke with [facility] Crane Representative, who assured me that the JAK2-V617F is always run first and confirmed negative before the other labs are completed, but that they are typically ordered in conjunction. Lab work completed and faxed to [phone]. JAK2-V617F was negative and the other molecular labs were performed. [place] given results and will communicate further with [employer] regarding insurance coverage.

## 2018-01-19 LAB — JAK2 (INCLUDING V617F AND EXON 12), MPL,& CALR-NEXT GEN SEQ

## 2018-01-22 ENCOUNTER — Ambulatory Visit (INDEPENDENT_AMBULATORY_CARE_PROVIDER_SITE_OTHER): Payer: BLUE CROSS/BLUE SHIELD | Admitting: Podiatry

## 2018-01-22 ENCOUNTER — Encounter: Payer: Self-pay | Admitting: Podiatry

## 2018-01-22 ENCOUNTER — Ambulatory Visit (INDEPENDENT_AMBULATORY_CARE_PROVIDER_SITE_OTHER): Payer: BLUE CROSS/BLUE SHIELD

## 2018-01-22 ENCOUNTER — Other Ambulatory Visit: Payer: Self-pay | Admitting: Podiatry

## 2018-01-22 DIAGNOSIS — M722 Plantar fascial fibromatosis: Secondary | ICD-10-CM | POA: Diagnosis not present

## 2018-01-22 DIAGNOSIS — M79671 Pain in right foot: Secondary | ICD-10-CM

## 2018-01-22 MED ORDER — TRIAMCINOLONE ACETONIDE 10 MG/ML IJ SUSP: 10.0000 mg | Freq: Once | INTRAMUSCULAR | Status: DC

## 2018-01-22 NOTE — Progress Notes (Signed)
Dg rg

## 2018-01-22 NOTE — Progress Notes (Signed)
   Subjective:    Patient ID: Haley Harding, female    DOB: 1966/06/08, 52 y.o.   MRN: [id number]  HPI    Review of Systems     Objective:   Physical Exam        Assessment & Plan:

## 2018-01-22 NOTE — Patient Instructions (Signed)

## 2018-01-23 ENCOUNTER — Ambulatory Visit: Payer: BLUE CROSS/BLUE SHIELD

## 2018-01-24 NOTE — Progress Notes (Signed)
Subjective:   Patient ID: Haley Harding, female   DOB: 52 y.o.   MRN: [id number]   HPI Patient presents with severe pain in the right plantar heel of 3 weeks duration and states is worse when getting up in the morning and after periods of sitting.  Patient does not smoke and likes to be active   Review of Systems  All other systems reviewed and are negative.       Objective:  Physical Exam  Constitutional: She appears well-developed and well-nourished.  Cardiovascular: Intact distal pulses.  Pulmonary/Chest: Effort normal.  Musculoskeletal: Normal range of motion.  Neurological: She is alert.  Skin: Skin is warm.  Nursing note and vitals reviewed.   Neurovascular status intact muscle strength adequate range of motion within normal limits with patient found to have exquisite discomfort plantar aspect of the heel right with inflammation fluid around the medial band.  Patient is noted to have good digital perfusion and is well oriented x3     Assessment:  Acute plantar fasciitis right with inflammation fluid around the medial band F2 H&P condition reviewed and today I reviewed x-rays with patient.  I injected the plantar fascia right 3 mg Kenalog 5 mg Xylocaine applied fascial brace gave instructions on physical therapy placed on oral anti-inflammatories and reappoint to recheck again in 2 weeks  X-rays indicate that there is spur formation with no indications of stress fracture arthritis     Plan:  Above

## 2018-02-07 ENCOUNTER — Ambulatory Visit: Payer: BLUE CROSS/BLUE SHIELD | Admitting: Podiatry

## 2018-02-07 ENCOUNTER — Encounter: Payer: Self-pay | Admitting: Podiatry

## 2018-02-07 DIAGNOSIS — M722 Plantar fascial fibromatosis: Secondary | ICD-10-CM | POA: Diagnosis not present

## 2018-02-09 NOTE — Progress Notes (Signed)
Subjective:   Patient ID: Haley Harding, female   DOB: 52 y.o.   MRN: [id number]   HPI Patient states that she is doing well and is having minimal discomfort and is walking with a better gait pattern   ROS      Objective:  Physical Exam  Neurovascular status intact muscle strength is adequate range of motion within normal limits with patient noted to have significant diminishment of discomfort in the plantar heel with diminished fluid buildup noted around the medial band     Assessment:  Significant improvement plantar fasciitis     Plan:  Discussed condition a great deal and recommended continued physical therapy anti-inflammatory supportive shoe gear and patient will be seen back on an as-needed basis

## 2018-02-19 ENCOUNTER — Other Ambulatory Visit: Payer: BLUE CROSS/BLUE SHIELD

## 2018-02-19 ENCOUNTER — Ambulatory Visit: Payer: BLUE CROSS/BLUE SHIELD | Admitting: Hematology

## 2018-03-14 ENCOUNTER — Other Ambulatory Visit: Payer: BLUE CROSS/BLUE SHIELD

## 2018-03-14 ENCOUNTER — Ambulatory Visit: Payer: BLUE CROSS/BLUE SHIELD | Admitting: Hematology

## 2018-09-28 DIAGNOSIS — R7989 Other specified abnormal findings of blood chemistry: Secondary | ICD-10-CM | POA: Diagnosis not present

## 2018-09-28 DIAGNOSIS — Z23 Encounter for immunization: Secondary | ICD-10-CM | POA: Diagnosis not present

## 2018-09-28 DIAGNOSIS — F324 Major depressive disorder, single episode, in partial remission: Secondary | ICD-10-CM | POA: Diagnosis not present

## 2018-09-28 DIAGNOSIS — R946 Abnormal results of thyroid function studies: Secondary | ICD-10-CM | POA: Diagnosis not present

## 2018-09-28 DIAGNOSIS — I10 Essential (primary) hypertension: Secondary | ICD-10-CM | POA: Diagnosis not present

## 2018-09-28 DIAGNOSIS — B001 Herpesviral vesicular dermatitis: Secondary | ICD-10-CM | POA: Diagnosis not present

## 2018-10-04 DIAGNOSIS — Z1211 Encounter for screening for malignant neoplasm of colon: Secondary | ICD-10-CM | POA: Diagnosis not present

## 2018-10-04 DIAGNOSIS — Z01411 Encounter for gynecological examination (general) (routine) with abnormal findings: Secondary | ICD-10-CM | POA: Diagnosis not present

## 2018-10-04 DIAGNOSIS — N852 Hypertrophy of uterus: Secondary | ICD-10-CM | POA: Diagnosis not present

## 2018-10-04 DIAGNOSIS — N920 Excessive and frequent menstruation with regular cycle: Secondary | ICD-10-CM | POA: Diagnosis not present

## 2019-01-11 DIAGNOSIS — F324 Major depressive disorder, single episode, in partial remission: Secondary | ICD-10-CM | POA: Diagnosis not present

## 2019-01-11 DIAGNOSIS — Z1211 Encounter for screening for malignant neoplasm of colon: Secondary | ICD-10-CM | POA: Diagnosis not present

## 2019-01-11 DIAGNOSIS — I10 Essential (primary) hypertension: Secondary | ICD-10-CM | POA: Diagnosis not present

## 2019-05-10 DIAGNOSIS — F324 Major depressive disorder, single episode, in partial remission: Secondary | ICD-10-CM | POA: Diagnosis not present

## 2019-05-10 DIAGNOSIS — I10 Essential (primary) hypertension: Secondary | ICD-10-CM | POA: Diagnosis not present

## 2019-08-02 DIAGNOSIS — Z1211 Encounter for screening for malignant neoplasm of colon: Secondary | ICD-10-CM | POA: Diagnosis not present

## 2019-08-02 DIAGNOSIS — K648 Other hemorrhoids: Secondary | ICD-10-CM | POA: Diagnosis not present

## 2019-08-02 DIAGNOSIS — K6389 Other specified diseases of intestine: Secondary | ICD-10-CM | POA: Diagnosis not present

## 2019-08-02 DIAGNOSIS — K635 Polyp of colon: Secondary | ICD-10-CM | POA: Diagnosis not present

## 2019-08-06 DIAGNOSIS — K635 Polyp of colon: Secondary | ICD-10-CM | POA: Diagnosis not present

## 2019-12-06 DIAGNOSIS — B001 Herpesviral vesicular dermatitis: Secondary | ICD-10-CM | POA: Diagnosis not present

## 2019-12-06 DIAGNOSIS — F324 Major depressive disorder, single episode, in partial remission: Secondary | ICD-10-CM | POA: Diagnosis not present

## 2019-12-06 DIAGNOSIS — D473 Essential (hemorrhagic) thrombocythemia: Secondary | ICD-10-CM | POA: Diagnosis not present

## 2019-12-06 DIAGNOSIS — I10 Essential (primary) hypertension: Secondary | ICD-10-CM | POA: Diagnosis not present

## 2019-12-12 DIAGNOSIS — Z1231 Encounter for screening mammogram for malignant neoplasm of breast: Secondary | ICD-10-CM | POA: Diagnosis not present

## 2019-12-12 DIAGNOSIS — I10 Essential (primary) hypertension: Secondary | ICD-10-CM | POA: Diagnosis not present

## 2019-12-12 DIAGNOSIS — D473 Essential (hemorrhagic) thrombocythemia: Secondary | ICD-10-CM | POA: Diagnosis not present

## 2020-01-23 DIAGNOSIS — T1590XA Foreign body on external eye, part unspecified, unspecified eye, initial encounter: Secondary | ICD-10-CM | POA: Diagnosis not present

## 2020-04-24 DIAGNOSIS — M25551 Pain in right hip: Secondary | ICD-10-CM | POA: Diagnosis not present

## 2020-04-24 DIAGNOSIS — M7061 Trochanteric bursitis, right hip: Secondary | ICD-10-CM | POA: Diagnosis not present

## 2020-07-06 DIAGNOSIS — D509 Iron deficiency anemia, unspecified: Secondary | ICD-10-CM | POA: Diagnosis not present

## 2020-07-06 DIAGNOSIS — N951 Menopausal and female climacteric states: Secondary | ICD-10-CM | POA: Diagnosis not present

## 2020-07-06 DIAGNOSIS — I10 Essential (primary) hypertension: Secondary | ICD-10-CM | POA: Diagnosis not present

## 2020-07-06 DIAGNOSIS — F324 Major depressive disorder, single episode, in partial remission: Secondary | ICD-10-CM | POA: Diagnosis not present

## 2020-07-06 DIAGNOSIS — R5383 Other fatigue: Secondary | ICD-10-CM | POA: Diagnosis not present

## 2020-07-17 DIAGNOSIS — Z01411 Encounter for gynecological examination (general) (routine) with abnormal findings: Secondary | ICD-10-CM | POA: Diagnosis not present

## 2020-07-17 DIAGNOSIS — N852 Hypertrophy of uterus: Secondary | ICD-10-CM | POA: Diagnosis not present

## 2020-07-17 DIAGNOSIS — D251 Intramural leiomyoma of uterus: Secondary | ICD-10-CM | POA: Diagnosis not present

## 2020-07-17 DIAGNOSIS — Z01419 Encounter for gynecological examination (general) (routine) without abnormal findings: Secondary | ICD-10-CM | POA: Diagnosis not present

## 2020-07-17 DIAGNOSIS — Z1151 Encounter for screening for human papillomavirus (HPV): Secondary | ICD-10-CM | POA: Diagnosis not present

## 2020-09-18 DIAGNOSIS — R7989 Other specified abnormal findings of blood chemistry: Secondary | ICD-10-CM | POA: Diagnosis not present

## 2020-09-18 DIAGNOSIS — E559 Vitamin D deficiency, unspecified: Secondary | ICD-10-CM | POA: Diagnosis not present

## 2020-09-18 DIAGNOSIS — Z23 Encounter for immunization: Secondary | ICD-10-CM | POA: Diagnosis not present

## 2020-10-05 DIAGNOSIS — Z20822 Contact with and (suspected) exposure to covid-19: Secondary | ICD-10-CM | POA: Diagnosis not present

## 2020-10-26 DIAGNOSIS — Z0000 Encounter for general adult medical examination without abnormal findings: Secondary | ICD-10-CM | POA: Diagnosis not present

## 2020-11-25 DIAGNOSIS — I10 Essential (primary) hypertension: Secondary | ICD-10-CM | POA: Diagnosis not present

## 2020-11-25 DIAGNOSIS — F324 Major depressive disorder, single episode, in partial remission: Secondary | ICD-10-CM | POA: Diagnosis not present

## 2020-12-09 DIAGNOSIS — M25551 Pain in right hip: Secondary | ICD-10-CM | POA: Diagnosis not present

## 2020-12-17 DIAGNOSIS — L0390 Cellulitis, unspecified: Secondary | ICD-10-CM | POA: Diagnosis not present

## 2020-12-20 DIAGNOSIS — M545 Low back pain, unspecified: Secondary | ICD-10-CM | POA: Diagnosis not present

## 2021-01-25 DIAGNOSIS — F324 Major depressive disorder, single episode, in partial remission: Secondary | ICD-10-CM | POA: Diagnosis not present

## 2021-03-19 DIAGNOSIS — L810 Postinflammatory hyperpigmentation: Secondary | ICD-10-CM | POA: Diagnosis not present

## 2021-04-26 DIAGNOSIS — I10 Essential (primary) hypertension: Secondary | ICD-10-CM | POA: Diagnosis not present

## 2021-04-26 DIAGNOSIS — R5383 Other fatigue: Secondary | ICD-10-CM | POA: Diagnosis not present

## 2021-04-26 DIAGNOSIS — A6000 Herpesviral infection of urogenital system, unspecified: Secondary | ICD-10-CM | POA: Diagnosis not present

## 2021-04-26 DIAGNOSIS — F324 Major depressive disorder, single episode, in partial remission: Secondary | ICD-10-CM | POA: Diagnosis not present

## 2021-04-26 DIAGNOSIS — E559 Vitamin D deficiency, unspecified: Secondary | ICD-10-CM | POA: Diagnosis not present

## 2021-04-26 DIAGNOSIS — D473 Essential (hemorrhagic) thrombocythemia: Secondary | ICD-10-CM | POA: Diagnosis not present

## 2021-04-26 DIAGNOSIS — D509 Iron deficiency anemia, unspecified: Secondary | ICD-10-CM | POA: Diagnosis not present

## 2021-05-19 DIAGNOSIS — M25562 Pain in left knee: Secondary | ICD-10-CM | POA: Diagnosis not present

## 2021-06-11 ENCOUNTER — Encounter: Payer: Self-pay | Admitting: Hematology

## 2021-06-11 ENCOUNTER — Other Ambulatory Visit: Payer: Self-pay

## 2021-06-11 ENCOUNTER — Emergency Department (HOSPITAL_COMMUNITY): Payer: BC Managed Care – PPO

## 2021-06-11 ENCOUNTER — Emergency Department (HOSPITAL_COMMUNITY)
Admission: EM | Admit: 2021-06-11 | Discharge: 2021-06-12 | Disposition: A | Discharge status: Home / Self Care | Payer: BC Managed Care – PPO | Attending: Emergency Medicine | Admitting: Emergency Medicine

## 2021-06-11 ENCOUNTER — Encounter (HOSPITAL_COMMUNITY): Payer: Self-pay

## 2021-06-11 DIAGNOSIS — Z79899 Other long term (current) drug therapy: Secondary | ICD-10-CM | POA: Diagnosis not present

## 2021-06-11 DIAGNOSIS — R079 Chest pain, unspecified: Secondary | ICD-10-CM

## 2021-06-11 DIAGNOSIS — R0789 Other chest pain: Secondary | ICD-10-CM | POA: Insufficient documentation

## 2021-06-11 DIAGNOSIS — M79602 Pain in left arm: Secondary | ICD-10-CM | POA: Insufficient documentation

## 2021-06-11 DIAGNOSIS — M25511 Pain in right shoulder: Secondary | ICD-10-CM | POA: Diagnosis not present

## 2021-06-11 DIAGNOSIS — M542 Cervicalgia: Secondary | ICD-10-CM | POA: Diagnosis not present

## 2021-06-11 DIAGNOSIS — I10 Essential (primary) hypertension: Secondary | ICD-10-CM | POA: Diagnosis not present

## 2021-06-11 NOTE — ED Triage Notes (Signed)
Pt reports chest pain that radiates to left arm and back that began around 2pm. Pt reports mild SHOB with pain.

## 2021-06-12 LAB — CBC
HCT: 43.7 % (ref 36.0–46.0)
Hemoglobin: 14.2 g/dL (ref 12.0–15.0)
MCH: 27.7 pg (ref 26.0–34.0)
MCHC: 32.5 g/dL (ref 30.0–36.0)
MCV: 85.2 fL (ref 80.0–100.0)
Platelets: 460 10*3/uL — ABNORMAL HIGH (ref 150–400)
RBC: 5.13 MIL/uL — ABNORMAL HIGH (ref 3.87–5.11)
RDW: 14.9 % (ref 11.5–15.5)
WBC: 9.4 10*3/uL (ref 4.0–10.5)
nRBC: 0 % (ref 0.0–0.2)

## 2021-06-12 LAB — BASIC METABOLIC PANEL
Anion gap: 8 (ref 5–15)
BUN: 14 mg/dL (ref 6–20)
CO2: 28 mmol/L (ref 22–32)
Calcium: 9.3 mg/dL (ref 8.9–10.3)
Chloride: 102 mmol/L (ref 98–111)
Creatinine, Ser: 0.71 mg/dL (ref 0.44–1.00)
GFR, Estimated: >60 mL/min (ref >60)
Glucose, Bld: 121 mg/dL — ABNORMAL HIGH (ref 70–99)
Potassium: 3.5 mmol/L (ref 3.5–5.1)
Sodium: 138 mmol/L (ref 135–145)

## 2021-06-12 LAB — TROPONIN I (HIGH SENSITIVITY)
Troponin I (High Sensitivity): 2 ng/L (ref <18)
Troponin I (High Sensitivity): 2 ng/L (ref <18)

## 2021-06-12 LAB — I-STAT BETA HCG BLOOD, ED (MC, WL, AP ONLY): I-stat hCG, quantitative: 10 m[IU]/mL — ABNORMAL HIGH (ref <5)

## 2021-06-12 NOTE — ED Provider Notes (Signed)
[facility] DEPT Provider Note   CSN: [id number] Arrival date & time: 06/11/21  2100     History Chief Complaint  Patient presents with   Chest Pain    Haley Harding is a 55 y.o. female.  The history is provided by the patient and medical records.  Chest Pain  55 y.o. F with hx of anemia, anxiety, HTN, presenting to the ED for chest pain.  States it actually started in her right shoulder, ran across top of her back, and down into left side of neck and left arm.  She does report feeling some left sided chest discomfort as well.  She denies numbness/weakness of the arm.  She denies any shortness of breath, palpitations, dizziness, or feelings of syncope.  Nothing really makes the pain worse but reports if he squeezes her arm, the pain seems to go away.  She has not had any cough, fever, or upper respiratory symptoms.  She denies any known cardiac history.  She is not a smoker.  Past Medical History:  Diagnosis Date   Anemia    Anxiety    Chest pain 08/2015   Depression    Herpes genitalia    Hypertension    Iron deficiency anemia due to chronic blood loss    Mass of uterus 10/07/2013   Menorrhagia    Uterine cyst     Patient Active Problem List   Diagnosis Date Noted   Hypokalemia 09/16/2015   Hyperglycemia 09/16/2015   Thyromegaly 09/16/2015   GAD (generalized anxiety disorder)    Deficiency anemia 07/27/2012   Iron deficiency anemia due to chronic blood loss 07/27/2012   Visit for screening mammogram 07/27/2012   Routine general medical examination at a health care facility 07/27/2012   Essential hypertension, benign 10/26/2011   GENITAL HERPES 03/24/2010    Past Surgical History:  Procedure Laterality Date   ESOPHAGOGASTRODUODENOSCOPY N/A 01/24/2014   Procedure: ESOPHAGOGASTRODUODENOSCOPY (EGD);  Surgeon: Jerene Bears, MD;  Location: [facility];  Service: Gastroenterology;  Laterality: N/A;   TUBAL LIGATION       OB History    No obstetric history on file.     Family History  Problem Relation Age of Onset   Hypertension Mother    Hypertension Father    Diabetes Father    Cancer Neg Hx    Alcohol abuse Neg Hx    Early death Neg Hx    Hearing loss Neg Hx    Heart disease Neg Hx    Hyperlipidemia Neg Hx    Kidney disease Neg Hx    Stroke Neg Hx     Social History   Tobacco Use   Smoking status: Never   Smokeless tobacco: Never  Substance Use Topics   Alcohol use: No   Drug use: No    Home Medications Prior to Admission medications   Medication Sig Start Date End Date Taking? Authorizing Provider  acyclovir (ZOVIRAX) 800 MG tablet Take 1 tablet (800 mg total) by mouth 3 (three) times daily. 11/20/15   Janith Lima, MD  amLODipine (NORVASC) 5 MG tablet Take 1 tablet (5 mg total) by mouth daily. 11/20/15   Janith Lima, MD  ferrous sulfate 325 (65 FE) MG EC tablet Take 325 mg by mouth 3 (three) times daily with meals.    [provider]  hydrochlorothiazide (MICROZIDE) 12.5 MG capsule Take 1 capsule (12.5 mg total) by mouth daily. Yearly physical due in Sept must see MD for refills  05/11/16   Janith Lima, MD  iron polysaccharides (NIFEREX) 150 MG capsule Take 1 capsule (150 mg total) by mouth daily. 01/08/18   Brunetta Genera, MD  omeprazole (PRILOSEC) 20 MG capsule Take 1 capsule (20 mg total) by mouth daily. Yearly physical  due in Sept must see md for refills 05/11/16   Janith Lima, MD  potassium chloride SA (K-DUR,KLOR-CON) 20 MEQ tablet Take 1 tablet (20 mEq total) by mouth daily. Yearly physical due in Sept must see MD for refills 05/11/16   Janith Lima, MD  sertraline (ZOLOFT) 100 MG tablet Take 1 tablet (100 mg total) by mouth daily. 08/12/16   Biagio Borg, MD    Allergies    Duloxetine  Review of Systems   Review of Systems  Cardiovascular:  Positive for chest pain.  All other systems reviewed and are negative.  Physical Exam Updated Vital Signs BP (!)  168/97 (BP Location: Left Arm)   Pulse 72   Temp 98.3 F (36.8 C) (Oral)   Resp 16   Ht 5\' 7"  (1.702 m)   Wt 95.3 kg   SpO2 99%   BMI 32.89 kg/m   Physical Exam Vitals and nursing note reviewed.  Constitutional:      Appearance: She is well-developed.  HENT:     Head: Normocephalic and atraumatic.  Eyes:     Conjunctiva/sclera: Conjunctivae normal.     Pupils: Pupils are equal, round, and reactive to light.  Cardiovascular:     Rate and Rhythm: Normal rate and regular rhythm.     Heart sounds: Normal heart sounds.  Pulmonary:     Effort: Pulmonary effort is normal.     Breath sounds: Normal breath sounds.  Abdominal:     General: Bowel sounds are normal.     Palpations: Abdomen is soft.  Musculoskeletal:        General: Normal range of motion.     Cervical back: Normal range of motion.     Comments: No reproducible tenderness of the neck or upper back, no deformities, no signs of trauma, normal strength/sensation of both arms, normal strength  Skin:    General: Skin is warm and dry.  Neurological:     Mental Status: She is alert and oriented to person, place, and time.    ED Results / Procedures / Treatments   Labs (all labs ordered are listed, but only abnormal results are displayed) Labs Reviewed  I-STAT BETA HCG BLOOD, ED ([facility], [facility], [facility] ONLY) - Abnormal; Notable for the following components:      Result Value   I-stat hCG, quantitative 10.0 (*)    All other components within normal limits  BASIC METABOLIC PANEL  CBC  TROPONIN I (HIGH SENSITIVITY)  TROPONIN I (HIGH SENSITIVITY)    EKG None  Radiology DG Chest 2 View  Result Date: 06/11/2021 CLINICAL DATA:  Chest pain. Left-sided pain radiating down left arm. EXAM: CHEST - 2 VIEW COMPARISON:  08/12/2016 FINDINGS: The cardiomediastinal contours are normal. The lungs are clear. Pulmonary vasculature is normal. No consolidation, pleural effusion, or pneumothorax. No acute osseous abnormalities are seen.  IMPRESSION: No acute chest findings. Electronically Signed   By: Keith Rake M.D.   On: 06/11/2021 21:59    Procedures Procedures   Medications Ordered in ED Medications - No data to display  ED Course  I have reviewed the triage vital signs and the nursing notes.  Pertinent labs & imaging results that were available during my  care of the patient were reviewed by me and considered in my medical decision making (see chart for details).    MDM Rules/Calculators/A&P  55 y.o. F here with chest pain.  Began in right neck/shoulder then transitioned across upper back and down to left arm/chest.  Pain does get better when pressure applied to left arm.  No known cardiac history.  She is afebrile and nontoxic in appearance here.  Her exam is overall reassuring.  She does not have any reproducible pain of the neck or chest on exam.    EKG is nonischemic.  Labs are reassuring including troponin x2.  Chest x-ray is clear.  Patient's symptoms are a little atypical, especially with improvement of pain when applying pressure to the left arm.  I question if this may be a radicular pain from her neck.  She does not have any focal deficits with this, no red flag symptoms concerning for central cord syndrome.  Patient is eager to be discharged which seems appropriate.  Will have her follow-up closely with primary care doctor.  Return here for new concerns.  Final Clinical Impression(s) / ED Diagnoses Final diagnoses:  Chest pain in adult  Neck pain    Rx / DC Orders ED Discharge Orders     None        Larene Pickett, PA-C 06/12/21 0348    Merrily Pew, MD 06/12/21 [phone]

## 2021-06-12 NOTE — Discharge Instructions (Addendum)
Your cardiac work-up today was normal.  Pain may be stemming from your neck. Recommend to follow-up with your primary care doctor. Return here for new concerns.

## 2021-06-12 NOTE — ED Notes (Signed)
22g IV removed from pt's right forearm. Pt disconnected from all v/s monitoring and is getting dressed while waiting for d/c paperwork.

## 2021-10-27 DIAGNOSIS — I10 Essential (primary) hypertension: Secondary | ICD-10-CM | POA: Diagnosis not present

## 2021-10-27 DIAGNOSIS — Z13220 Encounter for screening for lipoid disorders: Secondary | ICD-10-CM | POA: Diagnosis not present

## 2021-10-27 DIAGNOSIS — Z0000 Encounter for general adult medical examination without abnormal findings: Secondary | ICD-10-CM | POA: Diagnosis not present

## 2021-11-05 DIAGNOSIS — R1013 Epigastric pain: Secondary | ICD-10-CM | POA: Diagnosis not present

## 2021-11-05 DIAGNOSIS — R7989 Other specified abnormal findings of blood chemistry: Secondary | ICD-10-CM | POA: Diagnosis not present

## 2021-11-05 DIAGNOSIS — D473 Essential (hemorrhagic) thrombocythemia: Secondary | ICD-10-CM | POA: Diagnosis not present

## 2021-11-05 DIAGNOSIS — R748 Abnormal levels of other serum enzymes: Secondary | ICD-10-CM | POA: Diagnosis not present

## 2021-11-05 DIAGNOSIS — K920 Hematemesis: Secondary | ICD-10-CM | POA: Diagnosis not present

## 2021-11-17 DIAGNOSIS — E1165 Type 2 diabetes mellitus with hyperglycemia: Secondary | ICD-10-CM | POA: Diagnosis not present

## 2021-11-17 DIAGNOSIS — I10 Essential (primary) hypertension: Secondary | ICD-10-CM | POA: Diagnosis not present

## 2021-11-26 DIAGNOSIS — R748 Abnormal levels of other serum enzymes: Secondary | ICD-10-CM | POA: Diagnosis not present

## 2021-11-26 DIAGNOSIS — R7401 Elevation of levels of liver transaminase levels: Secondary | ICD-10-CM | POA: Diagnosis not present

## 2021-11-26 DIAGNOSIS — K7689 Other specified diseases of liver: Secondary | ICD-10-CM | POA: Diagnosis not present

## 2021-11-30 DIAGNOSIS — Z1231 Encounter for screening mammogram for malignant neoplasm of breast: Secondary | ICD-10-CM | POA: Diagnosis not present

## 2022-02-18 DIAGNOSIS — I10 Essential (primary) hypertension: Secondary | ICD-10-CM | POA: Diagnosis not present

## 2022-02-18 DIAGNOSIS — E1169 Type 2 diabetes mellitus with other specified complication: Secondary | ICD-10-CM | POA: Diagnosis not present

## 2022-03-25 DIAGNOSIS — R058 Other specified cough: Secondary | ICD-10-CM | POA: Diagnosis not present

## 2022-04-27 DIAGNOSIS — I10 Essential (primary) hypertension: Secondary | ICD-10-CM | POA: Diagnosis not present

## 2022-04-27 DIAGNOSIS — E1169 Type 2 diabetes mellitus with other specified complication: Secondary | ICD-10-CM | POA: Diagnosis not present

## 2022-04-27 DIAGNOSIS — R748 Abnormal levels of other serum enzymes: Secondary | ICD-10-CM | POA: Diagnosis not present

## 2022-04-27 DIAGNOSIS — F324 Major depressive disorder, single episode, in partial remission: Secondary | ICD-10-CM | POA: Diagnosis not present

## 2022-05-02 ENCOUNTER — Other Ambulatory Visit (HOSPITAL_COMMUNITY): Payer: Self-pay | Admitting: Gastroenterology

## 2022-05-02 ENCOUNTER — Other Ambulatory Visit: Payer: Self-pay | Admitting: Gastroenterology

## 2022-05-10 ENCOUNTER — Other Ambulatory Visit (HOSPITAL_COMMUNITY): Payer: Self-pay | Admitting: Gastroenterology

## 2022-05-10 ENCOUNTER — Other Ambulatory Visit: Payer: Self-pay | Admitting: Gastroenterology

## 2022-05-10 DIAGNOSIS — R748 Abnormal levels of other serum enzymes: Secondary | ICD-10-CM

## 2022-06-02 ENCOUNTER — Other Ambulatory Visit: Payer: Self-pay | Admitting: Radiology

## 2022-06-02 DIAGNOSIS — R748 Abnormal levels of other serum enzymes: Secondary | ICD-10-CM

## 2022-06-03 ENCOUNTER — Encounter (HOSPITAL_COMMUNITY): Payer: Self-pay | Admitting: *Deleted

## 2022-06-03 ENCOUNTER — Ambulatory Visit (HOSPITAL_COMMUNITY)
Admission: RE | Admit: 2022-06-03 | Discharge: 2022-06-03 | Disposition: A | Discharge status: Home / Self Care | Payer: BC Managed Care – PPO | Source: Ambulatory Visit | Attending: Student | Admitting: Student

## 2022-06-03 ENCOUNTER — Observation Stay (HOSPITAL_COMMUNITY)
Admission: RE | Admit: 2022-06-03 | Discharge: 2022-06-04 | Disposition: A | Discharge status: Home / Self Care | Payer: BC Managed Care – PPO | Source: Ambulatory Visit | Attending: Internal Medicine | Admitting: Internal Medicine

## 2022-06-03 ENCOUNTER — Ambulatory Visit (HOSPITAL_COMMUNITY)
Admission: RE | Admit: 2022-06-03 | Discharge: 2022-06-03 | Disposition: A | Discharge status: Home / Self Care | Payer: BC Managed Care – PPO | Source: Ambulatory Visit | Attending: Gastroenterology | Admitting: Gastroenterology

## 2022-06-03 DIAGNOSIS — I10 Essential (primary) hypertension: Secondary | ICD-10-CM | POA: Diagnosis not present

## 2022-06-03 DIAGNOSIS — Z6832 Body mass index (BMI) 32.0-32.9, adult: Secondary | ICD-10-CM | POA: Insufficient documentation

## 2022-06-03 DIAGNOSIS — R748 Abnormal levels of other serum enzymes: Secondary | ICD-10-CM

## 2022-06-03 DIAGNOSIS — Y838 Other surgical procedures as the cause of abnormal reaction of the patient, or of later complication, without mention of misadventure at the time of the procedure: Secondary | ICD-10-CM | POA: Insufficient documentation

## 2022-06-03 DIAGNOSIS — F411 Generalized anxiety disorder: Secondary | ICD-10-CM | POA: Insufficient documentation

## 2022-06-03 DIAGNOSIS — K7689 Other specified diseases of liver: Secondary | ICD-10-CM | POA: Diagnosis not present

## 2022-06-03 DIAGNOSIS — K91840 Postprocedural hemorrhage and hematoma of a digestive system organ or structure following a digestive system procedure: Secondary | ICD-10-CM | POA: Diagnosis not present

## 2022-06-03 DIAGNOSIS — E669 Obesity, unspecified: Secondary | ICD-10-CM | POA: Diagnosis not present

## 2022-06-03 DIAGNOSIS — K760 Fatty (change of) liver, not elsewhere classified: Secondary | ICD-10-CM | POA: Insufficient documentation

## 2022-06-03 DIAGNOSIS — R52 Pain, unspecified: Secondary | ICD-10-CM | POA: Diagnosis present

## 2022-06-03 DIAGNOSIS — R109 Unspecified abdominal pain: Secondary | ICD-10-CM

## 2022-06-03 DIAGNOSIS — R7989 Other specified abnormal findings of blood chemistry: Secondary | ICD-10-CM | POA: Diagnosis not present

## 2022-06-03 LAB — CBC WITH DIFFERENTIAL/PLATELET
Abs Immature Granulocytes: 0.02 10*3/uL (ref 0.00–0.07)
Basophils Absolute: 0.1 10*3/uL (ref 0.0–0.1)
Basophils Relative: 1 %
Eosinophils Absolute: 0.1 10*3/uL (ref 0.0–0.5)
Eosinophils Relative: 2 %
HCT: 41.5 % (ref 36.0–46.0)
Hemoglobin: 13.6 g/dL (ref 12.0–15.0)
Immature Granulocytes: 0 %
Lymphocytes Relative: 24 %
Lymphs Abs: 1.8 10*3/uL (ref 0.7–4.0)
MCH: 27.8 pg (ref 26.0–34.0)
MCHC: 32.8 g/dL (ref 30.0–36.0)
MCV: 84.7 fL (ref 80.0–100.0)
Monocytes Absolute: 0.6 10*3/uL (ref 0.1–1.0)
Monocytes Relative: 7 %
Neutro Abs: 5 10*3/uL (ref 1.7–7.7)
Neutrophils Relative %: 66 %
Platelets: 404 10*3/uL — ABNORMAL HIGH (ref 150–400)
RBC: 4.9 MIL/uL (ref 3.87–5.11)
RDW: 14 % (ref 11.5–15.5)
WBC: 7.6 10*3/uL (ref 4.0–10.5)
nRBC: 0 % (ref 0.0–0.2)

## 2022-06-03 LAB — PROTIME-INR
INR: 1 (ref 0.8–1.2)
Prothrombin Time: 13.2 seconds (ref 11.4–15.2)

## 2022-06-03 LAB — COMPREHENSIVE METABOLIC PANEL
ALT: 14 U/L (ref 0–44)
AST: 16 U/L (ref 15–41)
Albumin: 3.9 g/dL (ref 3.5–5.0)
Alkaline Phosphatase: 162 U/L — ABNORMAL HIGH (ref 38–126)
Anion gap: 9 (ref 5–15)
BUN: 12 mg/dL (ref 6–20)
CO2: 25 mmol/L (ref 22–32)
Calcium: 9.4 mg/dL (ref 8.9–10.3)
Chloride: 107 mmol/L (ref 98–111)
Creatinine, Ser: 0.65 mg/dL (ref 0.44–1.00)
GFR, Estimated: >60 mL/min (ref >60)
Glucose, Bld: 88 mg/dL (ref 70–99)
Potassium: 3.8 mmol/L (ref 3.5–5.1)
Sodium: 141 mmol/L (ref 135–145)
Total Bilirubin: 0.5 mg/dL (ref 0.3–1.2)
Total Protein: 7.6 g/dL (ref 6.5–8.1)

## 2022-06-03 LAB — HEMOGLOBIN AND HEMATOCRIT, BLOOD
HCT: 38 % (ref 36.0–46.0)
HCT: 36.2 % (ref 36.0–46.0)
Hemoglobin: 12.5 g/dL (ref 12.0–15.0)
Hemoglobin: 11.8 g/dL — ABNORMAL LOW (ref 12.0–15.0)

## 2022-06-03 LAB — GLUCOSE, CAPILLARY: Glucose-Capillary: 91 mg/dL (ref 70–99)

## 2022-06-03 MED ORDER — FENTANYL CITRATE (PF) 100 MCG/2ML IJ SOLN
INTRAMUSCULAR | Status: DC | PRN
  Administered 2022-06-03: 50 ug via INTRAVENOUS

## 2022-06-03 MED ORDER — ONDANSETRON HCL 4 MG/2ML IJ SOLN
INTRAMUSCULAR | Status: AC
  Filled 2022-06-03: qty 2

## 2022-06-03 MED ORDER — SODIUM CHLORIDE 0.9 % IV SOLN: INTRAVENOUS | Status: DC

## 2022-06-03 MED ORDER — ONDANSETRON HCL 4 MG PO TABS: 4.0000 mg | ORAL_TABLET | Freq: Four times a day (QID) | ORAL | Status: DC | PRN

## 2022-06-03 MED ORDER — GELATIN ABSORBABLE 12-7 MM EX MISC
CUTANEOUS | Status: AC
  Filled 2022-06-03: qty 1

## 2022-06-03 MED ORDER — HYDROMORPHONE HCL 1 MG/ML IJ SOLN
1.0000 mg | Freq: Once | INTRAMUSCULAR | Status: AC
  Administered 2022-06-03: 1 mg via INTRAVENOUS
  Filled 2022-06-03: qty 1

## 2022-06-03 MED ORDER — OXYCODONE HCL 5 MG PO TABS
5.0000 mg | ORAL_TABLET | ORAL | Status: DC | PRN
  Administered 2022-06-03 – 2022-06-04 (×2): 5 mg via ORAL
  Filled 2022-06-03 (×2): qty 1

## 2022-06-03 MED ORDER — MIDAZOLAM HCL 2 MG/2ML IJ SOLN
INTRAMUSCULAR | Status: DC | PRN
  Administered 2022-06-03: 1 mg via INTRAVENOUS

## 2022-06-03 MED ORDER — LIDOCAINE HCL 1 % IJ SOLN
INTRAMUSCULAR | Status: DC | PRN
  Administered 2022-06-03: 10 mL via INTRADERMAL

## 2022-06-03 MED ORDER — FENTANYL CITRATE PF 50 MCG/ML IJ SOSY
PREFILLED_SYRINGE | INTRAMUSCULAR | Status: AC
  Filled 2022-06-03: qty 1

## 2022-06-03 MED ORDER — ACETAMINOPHEN 325 MG PO TABS
650.0000 mg | ORAL_TABLET | Freq: Four times a day (QID) | ORAL | Status: DC | PRN
  Administered 2022-06-04: 650 mg via ORAL
  Filled 2022-06-03: qty 2

## 2022-06-03 MED ORDER — HYDRALAZINE HCL 20 MG/ML IJ SOLN: 10.0000 mg | Freq: Four times a day (QID) | INTRAMUSCULAR | Status: DC | PRN

## 2022-06-03 MED ORDER — ONDANSETRON HCL 4 MG/2ML IJ SOLN
4.0000 mg | Freq: Once | INTRAMUSCULAR | Status: AC
  Administered 2022-06-03: 4 mg via INTRAVENOUS

## 2022-06-03 MED ORDER — MIDAZOLAM HCL 2 MG/2ML IJ SOLN
INTRAMUSCULAR | Status: AC
  Filled 2022-06-03: qty 2

## 2022-06-03 MED ORDER — FENTANYL CITRATE (PF) 100 MCG/2ML IJ SOLN
50.0000 ug | Freq: Once | INTRAMUSCULAR | Status: AC
  Administered 2022-06-03: 50 ug via INTRAVENOUS

## 2022-06-03 MED ORDER — FENTANYL CITRATE (PF) 100 MCG/2ML IJ SOLN: 12.5000 ug | INTRAMUSCULAR | Status: DC | PRN

## 2022-06-03 MED ORDER — POLYETHYLENE GLYCOL 3350 17 G PO PACK
17.0000 g | PACK | Freq: Every day | ORAL | Status: DC | PRN
Start: 2022-06-03 — End: 2022-06-04

## 2022-06-03 MED ORDER — ACETAMINOPHEN 650 MG RE SUPP: 650.0000 mg | Freq: Four times a day (QID) | RECTAL | Status: DC | PRN

## 2022-06-03 MED ORDER — LIDOCAINE HCL 1 % IJ SOLN
INTRAMUSCULAR | Status: AC
  Filled 2022-06-03: qty 20

## 2022-06-03 MED ORDER — FENTANYL CITRATE (PF) 100 MCG/2ML IJ SOLN
INTRAMUSCULAR | Status: AC
  Filled 2022-06-03: qty 2

## 2022-06-03 MED ORDER — HYDRALAZINE HCL 20 MG/ML IJ SOLN
10.0000 mg | Freq: Once | INTRAMUSCULAR | Status: AC
  Administered 2022-06-03: 10 mg via INTRAVENOUS
  Filled 2022-06-03: qty 1

## 2022-06-03 MED ORDER — OXYCODONE-ACETAMINOPHEN 5-325 MG PO TABS
1.0000 | ORAL_TABLET | Freq: Once | ORAL | Status: AC
  Administered 2022-06-03: 1 via ORAL
  Filled 2022-06-03: qty 1

## 2022-06-03 MED ORDER — ONDANSETRON HCL 4 MG/2ML IJ SOLN: 4.0000 mg | Freq: Four times a day (QID) | INTRAMUSCULAR | Status: DC | PRN

## 2022-06-03 MED ORDER — GELATIN ABSORBABLE 12-7 MM EX MISC
CUTANEOUS | Status: DC | PRN
  Administered 2022-06-03: 1 via TOPICAL

## 2022-06-03 NOTE — Progress Notes (Signed)
After liver biopsy, patient c/o 10/10 right abdominal pain, patient rolling form side to side in the bed moaning.  Aimee Han, PA notified and she came to unit to examine patient.

## 2022-06-03 NOTE — H&P (Addendum)
History and Physical    [name]:[id number] DOB: August 16, 1966 DOA: 06/03/2022  I have briefly reviewed the patient's prior medical records in [place]  PCP: Donald Prose, MD  Patient coming from: IR s/p liver biopsy (home prior)  Chief Complaint: abdominal pain  HPI: Haley Harding is a 56 y.o. female with medical history significant of HTN, anxiety/depression who was sent by GI to IR for a liver biopsy.   Liver biopsy was successful and samples sent to pathology.  After procedure, patient complained of 10/10 abdominal pain.  She was seen by IR and CT scan done that showed: Acute subcapsular hemorrhage adjacent to the posterior and inferior right lobe of the liver following liver biopsy. There is small component of intraperitoneal blood anteriorly that abuts the stomach.   In short stay, patient was given IV dilaudid, fentanyl and also PO pain meds without much improvement.   She also had some vomiting.  H/h done that showed hgb 12.5 from 13.6.   Review of Systems: As per HPI otherwise 10 point review of systems negative.   Past Medical History:  Diagnosis Date   Anemia    Anxiety    Chest pain 08/2015   Depression    Herpes genitalia    Hypertension    Iron deficiency anemia due to chronic blood loss    Mass of uterus 10/07/2013   Menorrhagia    Uterine cyst     Past Surgical History:  Procedure Laterality Date   ESOPHAGOGASTRODUODENOSCOPY N/A 01/24/2014   Procedure: ESOPHAGOGASTRODUODENOSCOPY (EGD);  Surgeon: Jerene Bears, MD;  Location: [facility];  Service: Gastroenterology;  Laterality: N/A;   TUBAL LIGATION       reports that she has never smoked. She has never used smokeless tobacco. She reports that she does not drink alcohol and does not use drugs.  Allergies  Allergen Reactions   Duloxetine     headache    Family History  Problem Relation Age of Onset   Hypertension Mother    Hypertension Father    Diabetes Father    Cancer Neg Hx     Alcohol abuse Neg Hx    Early death Neg Hx    Hearing loss Neg Hx    Heart disease Neg Hx    Hyperlipidemia Neg Hx    Kidney disease Neg Hx    Stroke Neg Hx     Prior to Admission medications   Medication Sig Start Date End Date Taking? Authorizing Provider  amLODipine (NORVASC) 5 MG tablet Take 1 tablet (5 mg total) by mouth daily. 11/20/15  Yes Janith Lima, MD  buPROPion (WELLBUTRIN XL) 300 MG 24 hr tablet Take 300 mg by mouth daily.   Yes [provider]  glipiZIDE (GLUCOTROL XL) 10 MG 24 hr tablet Take 10 mg by mouth daily with breakfast.   Yes [provider]  hydrochlorothiazide (MICROZIDE) 12.5 MG capsule Take 1 capsule (12.5 mg total) by mouth daily. Yearly physical due in Sept must see MD for refills 05/11/16  Yes Janith Lima, MD  lisinopril (ZESTRIL) 5 MG tablet Take 5 mg by mouth daily.   Yes [provider]  omeprazole (PRILOSEC) 20 MG capsule Take 1 capsule (20 mg total) by mouth daily. Yearly physical  due in Sept must see md for refills 05/11/16  Yes Janith Lima, MD  pravastatin (PRAVACHOL) 20 MG tablet Take 20 mg by mouth daily.   Yes [provider]  sertraline (ZOLOFT) 100  MG tablet Take 1 tablet (100 mg total) by mouth daily. 08/12/16  Yes Biagio Borg, MD  acyclovir (ZOVIRAX) 800 MG tablet Take 1 tablet (800 mg total) by mouth 3 (three) times daily. 11/20/15   Janith Lima, MD  ferrous sulfate 325 (65 FE) MG EC tablet Take 325 mg by mouth 3 (three) times daily with meals.    [provider]  iron polysaccharides (NIFEREX) 150 MG capsule Take 1 capsule (150 mg total) by mouth daily. 01/08/18   Brunetta Genera, MD  potassium chloride SA (K-DUR,KLOR-CON) 20 MEQ tablet Take 1 tablet (20 mEq total) by mouth daily. Yearly physical due in Sept must see MD for refills 05/11/16   Janith Lima, MD    Physical Exam: Vitals:   06/03/22 1452 06/03/22 1516 06/03/22 1530 06/03/22 1559  BP: (!) 145/83 137/85 (!) 141/86  117/67  Pulse: 91 86 84 92  Resp: '18 19 18 20  '$ Temp:      TempSrc:      SpO2: 99% 100% 100% 100%  Weight:      Height:          Constitutional: NAD, calm, comfortable Eyes: PERRL, lids and conjunctivae normal ENMT: Mucous membranes are moist. Posterior pharynx clear of any exudate or lesions.Normal dentition.  Neck: normal, supple, no masses, no thyromegaly Respiratory: clear to auscultation bilaterally, no wheezing, no crackles. Normal respiratory effort. No accessory muscle use.  Cardiovascular: Regular rate and rhythm, no murmurs / rubs / gallops. No extremity edema. 2+ pedal pulses.  Abdomen: + tenderness, no masses palpated. Bowel sounds positive.  Musculoskeletal: no clubbing / cyanosis. Normal muscle tone.  Skin: no rashes, lesions, ulcers. No induration Neurologic: CN 2-12 grossly intact. Strength 5/5 in all 4.  Psychiatric: sleepy as she has gotten pain meds  Labs on Admission: I have personally reviewed following labs and imaging studies  CBC: Recent Labs  Lab 06/03/22 1130  WBC 7.6  NEUTROABS 5.0  HGB 13.6  HCT 41.5  MCV 84.7  PLT 277*   Basic Metabolic Panel: Recent Labs  Lab 06/03/22 1130  NA 141  K 3.8  CL 107  CO2 25  GLUCOSE 88  BUN 12  CREATININE 0.65  CALCIUM 9.4   GFR: Estimated Creatinine Clearance: 95.3 mL/min (by C-G formula based on SCr of 0.65 mg/dL). Liver Function Tests: Recent Labs  Lab 06/03/22 1130  AST 16  ALT 14  ALKPHOS 162*  BILITOT 0.5  PROT 7.6  ALBUMIN 3.9   No results for input(s): "LIPASE", "AMYLASE" in the last 168 hours. No results for input(s): "AMMONIA" in the last 168 hours. Coagulation Profile: Recent Labs  Lab 06/03/22 1130  INR 1.0   Cardiac Enzymes: No results for input(s): "CKTOTAL", "CKMB", "CKMBINDEX", "TROPONINI" in the last 168 hours. BNP (last 3 results) No results for input(s): "PROBNP" in the last 8760 hours. HbA1C: No results for input(s): "HGBA1C" in the last 72 hours. CBG: Recent  Labs  Lab 06/03/22 1122  GLUCAP 91   Lipid Profile: No results for input(s): "CHOL", "HDL", "LDLCALC", "TRIG", "CHOLHDL", "LDLDIRECT" in the last 72 hours. Thyroid Function Tests: No results for input(s): "TSH", "T4TOTAL", "FREET4", "T3FREE", "THYROIDAB" in the last 72 hours. Anemia Panel: No results for input(s): "VITAMINB12", "FOLATE", "FERRITIN", "TIBC", "IRON", "RETICCTPCT" in the last 72 hours. Urine analysis:    Component Value Date/Time   COLORURINE YELLOW 10/27/2014 1117   APPEARANCEUR CLEAR 10/27/2014 1117   LABSPEC 1.020 10/27/2014 1117   PHURINE 6.5 10/27/2014 1117  GLUCOSEU NEGATIVE 10/27/2014 1117   HGBUR NEGATIVE 10/27/2014 1117   [place] 10/27/2014 1117   [place] 10/27/2014 1117   PROTEINUR NEGATIVE 01/22/2014 1915   UROBILINOGEN 0.2 10/27/2014 1117   NITRITE NEGATIVE 10/27/2014 1117   LEUKOCYTESUR NEGATIVE 10/27/2014 1117     Radiological Exams on Admission: CT ABDOMEN WO CONTRAST  Result Date: 06/03/2022 CLINICAL DATA:  Significant pain after random biopsy of the liver earlier today. EXAM: CT ABDOMEN WITHOUT CONTRAST TECHNIQUE: Multidetector CT imaging of the abdomen was performed following the standard protocol without IV contrast. RADIATION DOSE REDUCTION: This exam was performed according to the departmental dose-optimization program which includes automated exposure control, adjustment of the mA and/or kV according to patient size and/or use of iterative reconstruction technique. COMPARISON:  Imaging during ultrasound-guided biopsy of the liver earlier today. FINDINGS: Lower chest: No acute abnormality.  Moderate-sized hiatal hernia. Hepatobiliary: Subcapsular hemorrhage present around the inferior aspect of the right lobe of the liver following biopsy. There also is a small amount of pneumobilia. Maximal thickness of the subcapsular hemorrhage posteriorly is approximately 2.8 cm. A small amount of hemorrhage tracks anteriorly and abuts  the stomach. Pancreas: Unremarkable. No pancreatic ductal dilatation or surrounding inflammatory changes. Spleen: Normal in size without focal abnormality. Adrenals/Urinary Tract: Adrenal glands are unremarkable. Kidneys are normal, without renal calculi, focal lesion, or hydronephrosis. Stomach/Bowel: Bowel shows no evidence of obstruction, ileus, inflammation or lesion. No free intraperitoneal air. Vascular/Lymphatic: No significant vascular findings are present. No enlarged lymph nodes. Other: No abdominal wall hernia or abnormality. Musculoskeletal: No acute or significant osseous findings. IMPRESSION: Acute subcapsular hemorrhage adjacent to the posterior and inferior right lobe of the liver following liver biopsy. There is small component of intraperitoneal blood anteriorly that abuts the stomach also. Electronically Signed   By: Aletta Edouard M.D.   On: 06/03/2022 16:07   US BIOPSY (LIVER)  Result Date: 06/03/2022 INDICATION: Persistent elevated alkaline phosphatase level and need for random liver biopsy. EXAM: ULTRASOUND GUIDED CORE BIOPSY OF LIVER MEDICATIONS: None. ANESTHESIA/SEDATION: Moderate (conscious) sedation was employed during this procedure. A total of Versed 2.0 mg and Fentanyl 100 mcg was administered intravenously by radiology nursing. Moderate Sedation Time: 12 minutes. The patient's level of consciousness and vital signs were monitored continuously by radiology nursing throughout the procedure under my direct supervision. PROCEDURE: The procedure, risks, benefits, and alternatives were explained to the patient. Questions regarding the procedure were encouraged and answered. The patient understands and consents to the procedure. A time-out was performed prior to initiating the procedure. The right abdominal wall was prepped with chlorhexidine in a sterile fashion, and a sterile drape was applied covering the operative field. A sterile gown and sterile gloves were used for the procedure.  Local anesthesia was provided with 1% Lidocaine. A 17 gauge trocar needle was advanced into the right lobe of the liver under ultrasound guidance. After confirming needle tip position, 2 separate coaxial 18 gauge core biopsy samples were obtained and submitted in formalin. Gel-Foam pledgets were advanced through the outer needle as it was retracted and removed. Additional ultrasound was performed. COMPLICATIONS: None immediate. FINDINGS: Solid core biopsy samples were obtained from the liver. IMPRESSION: Ultrasound-guided core biopsy performed within the right lobe of the liver. Electronically Signed   By: Aletta Edouard M.D.   On: 06/03/2022 15:42      Assessment/Plan Acute subcapsular hemorrhage s/p liver biopsy -h/h at 9PM -CBC in AM -IR to follow along in case angiogram is needed -pain control  HTN -  BP meds from home- pending pharmacy reconciliation -will resume once done -for now IV hydralazine  Anxiety/depression -await home meds to be reconciled   Obesity Estimated body mass index is 32.89 kg/m as calculated from the following:   Height as of this encounter: '5\' 7"'$  (1.702 m).   Weight as of this encounter: 95.3 kg.   DVT prophylaxis: scd  Code Status: full   Disposition Plan: obs- suspect home in AM Consults called: IR to follow    Admission status: obs   At the point of initial evaluation, it is my clinical opinion that admission for OBSERVATION is reasonable and necessary because the patient's presenting complaints in the context of their chronic conditions represent sufficient risk of deterioration or significant morbidity to constitute reasonable grounds for close observation in the hospital setting, but that the patient may be medically stable for discharge from the hospital within 24 to 48 hours.    Geradine Girt [facility]   How to contact the [facility] Attending or Consulting provider [place] or covering provider during after hours [place], for this patient?   Check the care team in [facility] and look for a) attending/consulting [facility] provider listed and b) the [facility] team listed Log into [email or web address] and use [place]'s universal password to access. If you do not have the password, please contact the hospital operator. Locate the [facility] provider you are looking for under [facility] and page to a number that you can be directly reached. If you still have difficulty reaching the provider, please page the [facility] (Director on Call) for the Hospitalists listed on amion for assistance.  06/03/2022, 4:26 PM

## 2022-06-03 NOTE — Discharge Instructions (Signed)
Please call Interventional Radiology clinic [phone] with any questions or concerns.   You may remove your dressing and shower tomorrow.   Liver Biopsy, Care After After a liver biopsy, it is common to have these things in the area where the biopsy was done. You may: Have pain. Feel sore. Have bruising. You may also feel tired for a few days. Follow these instructions at home: Medicines Take over-the-counter and prescription medicines only as told by your doctor. If you were prescribed an antibiotic medicine, take it as told by your doctor. Do not stop taking the antibiotic, even if you start to feel better. Do not take medicines that may thin your blood. These medicines include aspirin and ibuprofen. Take them only if your doctor tells you to. If told, take steps to prevent problems with pooping (constipation). You may need to: Drink enough fluid to keep your pee (urine) pale yellow. Take medicines. You will be told what medicines to take. Eat foods that are high in fiber. These include beans, whole grains, and fresh fruits and vegetables. Limit foods that are high in fat and sugar. These include fried or sweet foods. Ask your doctor if you should avoid driving or using machines while you are taking your medicine. Caring for your incision Follow instructions from your doctor about how to take care of your cut from surgery (incisions). Make sure you: Wash your hands with soap and water for at least 20 seconds before and after you change your bandage. If you cannot use soap and water, use hand sanitizer. Change your bandage. Leavestitches or skin glue in place for at least two weeks. Leave tape strips alone unless you are told to take them off. You may trim the edges of the tape strips if they curl up. Check your incision every day for signs of infection. Check for: Redness, swelling, or more pain. Fluid or blood. Warmth. Pus or a bad smell. Do not take baths, swim, or use a  hot tub. Ask your doctor about taking showers or sponge baths. Activity Rest at home for 1-2 days, or as told by your doctor. Get up to take short walks every 1 to 2 hours. Ask for help if you feel weak or unsteady. Do not lift anything that is heavier than 10 lb (4.5 kg), or the limit that you are told. Do not play contact sports for 2 weeks after the procedure. Return to your normal activities as told by your doctor. Ask what activities are safe for you. General instructions  Do not drink alcohol in the first week after the procedure. Plan to have a responsible adult care for you for the time you are told after you leave the hospital or clinic. This is important. It is up to you to get the results of your procedure. Ask how to get your results when they are ready. Keep all follow-up visits.   Contact a doctor if: You have more bleeding in your incision. Your incision swells, or is red and more painful. You have fluid that comes from your incision. You develop a rash. You have fever or chills. Get help right away if: You have swelling, bloating, or pain in your belly (abdomen). You get dizzy or faint. You vomit or you feel like vomiting. You have trouble breathing or feel short of breath. You have chest pain. You have problems talking or seeing. You have trouble with your balance or moving your arms or legs. These symptoms may be an emergency. Get help   right away. Call your local emergency services (911 in the [place] Do not wait to see if the symptoms will go away. Do not drive yourself to the hospital. Summary After the procedure, it is common to have pain, soreness, bruising, and tiredness. Your doctor will tell you how to take care of yourself at home. Change your bandage, take your medicines, and limit your activities as told by your doctor. Call your doctor if you have symptoms of infection. Get help right away if your belly swells, your cut bleeds a lot, or you have trouble  talking or breathing. This information is not intended to replace advice given to you by your health care provider. Make sure you discuss any questions you have with your healthcare provider. Document Revised: 10/19/2020 Document Reviewed: 10/19/2020 Elsevier Patient Education  2022 Elsevier Inc.     Moderate Conscious Sedation, Adult, Care After This sheet gives you information about how to care for yourself after your procedure. Your health care provider may also give you more specific instructions. If you have problems or questions, contact your health care provider. What can I expect after the procedure? After the procedure, it is common to have: Sleepiness for several hours. Impaired judgment for several hours. Difficulty with balance. Vomiting if you eat too soon. Follow these instructions at home: For the time period you were told by your health care provider: Rest. Do not participate in activities where you could fall or become injured. Do not drive or use machinery. Do not drink alcohol. Do not take sleeping pills or medicines that cause drowsiness. Do not make important decisions or sign legal documents. Do not take care of children on your own.        Eating and drinking Follow the diet recommended by your health care provider. Drink enough fluid to keep your urine pale yellow. If you vomit: Drink water, juice, or soup when you can drink without vomiting. Make sure you have little or no nausea before eating solid foods.    General instructions Take over-the-counter and prescription medicines only as told by your health care provider. Have a responsible adult stay with you for the time you are told. It is important to have someone help care for you until you are awake and alert. Do not smoke. Keep all follow-up visits as told by your health care provider. This is important. Contact a health care provider if: You are still sleepy or having trouble with balance after  24 hours. You feel light-headed. You keep feeling nauseous or you keep vomiting. You develop a rash. You have a fever. You have redness or swelling around the IV site. Get help right away if: You have trouble breathing. You have new-onset confusion at home. Summary After the procedure, it is common to feel sleepy, have impaired judgment, or feel nauseous if you eat too soon. Rest after you get home. Know the things you should not do after the procedure. Follow the diet recommended by your health care provider and drink enough fluid to keep your urine pale yellow. Get help right away if you have trouble breathing or new-onset confusion at home. This information is not intended to replace advice given to you by your health care provider. Make sure you discuss any questions you have with your health care provider. Document Revised: 04/03/2020 Document Reviewed: 10/31/2019 Elsevier Patient Education  2021 Elsevier Inc.    

## 2022-06-03 NOTE — Procedures (Signed)
Interventional Radiology Procedure Note  Procedure: US Guided Biopsy of liver  Complications: None  Estimated Blood Loss: < 10 mL  Findings: 18 G core biopsy of liver performed under US guidance.  Two core samples obtained and sent to Pathology.  Venetia Night. Kathlene Cote, M.D Pager:  [phone]

## 2022-06-03 NOTE — H&P (Signed)
Chief Complaint: Patient was seen in consultation today for image guided random liver bx  at the request of Ronnette Juniper  Referring Physician(s): Ronnette Juniper  Supervising Physician: Aletta Edouard  Patient Status: [facility] - Out-pt  History of Present Illness: Haley Harding is a 56 y.o. female with PMHs of HTN, anxiety, depression, and elevated alkaline phosphatase who has been followed by GI.  Intervention report biopsy was recommended to the patient for further evaluation and management of elevated alkaline phosphatase , and after thorough discussion and shared decision making with the GI team, patient decided to proceed with the biopsy.  Patient presents to [facility], IR today for the biopsy.  Patient laying in bed, not in acute distress.  [name], fever, chills, shortness of breath, cough, chest pain, abdominal pain, nausea ,vomiting, and bleeding.   Past Medical History:  Diagnosis Date   Anemia    Anxiety    Chest pain 08/2015   Depression    Herpes genitalia    Hypertension    Iron deficiency anemia due to chronic blood loss    Mass of uterus 10/07/2013   Menorrhagia    Uterine cyst     Past Surgical History:  Procedure Laterality Date   ESOPHAGOGASTRODUODENOSCOPY N/A 01/24/2014   Procedure: ESOPHAGOGASTRODUODENOSCOPY (EGD);  Surgeon: Jerene Bears, MD;  Location: [facility];  Service: Gastroenterology;  Laterality: N/A;   TUBAL LIGATION      Allergies: Duloxetine  Medications: Prior to Admission medications   Medication Sig Start Date End Date Taking? Authorizing Provider  acyclovir (ZOVIRAX) 800 MG tablet Take 1 tablet (800 mg total) by mouth 3 (three) times daily. 11/20/15   Janith Lima, MD  amLODipine (NORVASC) 5 MG tablet Take 1 tablet (5 mg total) by mouth daily. 11/20/15   Janith Lima, MD  ferrous sulfate 325 (65 FE) MG EC tablet Take 325 mg by mouth 3 (three) times daily with meals.    [provider]  hydrochlorothiazide  (MICROZIDE) 12.5 MG capsule Take 1 capsule (12.5 mg total) by mouth daily. Yearly physical due in Sept must see MD for refills 05/11/16   Janith Lima, MD  iron polysaccharides (NIFEREX) 150 MG capsule Take 1 capsule (150 mg total) by mouth daily. 01/08/18   Brunetta Genera, MD  omeprazole (PRILOSEC) 20 MG capsule Take 1 capsule (20 mg total) by mouth daily. Yearly physical  due in Sept must see md for refills 05/11/16   Janith Lima, MD  potassium chloride SA (K-DUR,KLOR-CON) 20 MEQ tablet Take 1 tablet (20 mEq total) by mouth daily. Yearly physical due in Sept must see MD for refills 05/11/16   Janith Lima, MD  sertraline (ZOLOFT) 100 MG tablet Take 1 tablet (100 mg total) by mouth daily. 08/12/16   Biagio Borg, MD     Family History  Problem Relation Age of Onset   Hypertension Mother    Hypertension Father    Diabetes Father    Cancer Neg Hx    Alcohol abuse Neg Hx    Early death Neg Hx    Hearing loss Neg Hx    Heart disease Neg Hx    Hyperlipidemia Neg Hx    Kidney disease Neg Hx    Stroke Neg Hx     Social History   Socioeconomic History   Marital status: Divorced    Spouse name: Not on file   Number of children: 3   Years of education: Not on file  Highest education level: Not on file  Occupational History   Occupation: CLINICAL ASSISTANT    Employer: [employer]  Tobacco Use   Smoking status: Never   Smokeless tobacco: Never  Vaping Use   Vaping Use: Never used  Substance and Sexual Activity   Alcohol use: No   Drug use: No   Sexual activity: Not Currently  Other Topics Concern   Not on file  Social History Narrative   Divorced, one son to daughters   Sleep lab coordinator [facility]   One caffeinated beverage daily   10/07/2013         Social Determinants of Health   Financial Resource Strain: Not on file  Food Insecurity: Not on file  Transportation Needs: Not on file  Physical Activity: Not on  file  Stress: Not on file  Social Connections: Not on file     Review of Systems: A 12 point ROS discussed and pertinent positives are indicated in the HPI above.  All other systems are negative.  Vital Signs: BP (!) 170/98 (BP Location: Left Arm)   Pulse 83   Temp 98.2 F (36.8 C) (Oral)   Ht $R'5\' 7"'hF$  (1.702 m)   Wt 210 lb (95.3 kg)   SpO2 97%   BMI 32.89 kg/m   Physical Exam Vitals and nursing note reviewed.  Constitutional:      General: Patient is not in acute distress.    Appearance: Normal appearance. Patient is not ill-appearing.  HENT:     Head: Normocephalic and atraumatic.     Mouth/Throat:     Mouth: Mucous membranes are moist.     Pharynx: Oropharynx is clear.  Cardiovascular:     Rate and Rhythm: Normal rate and regular rhythm.     Pulses: Normal pulses.     Heart sounds: Normal heart sounds.  Pulmonary:     Effort: Pulmonary effort is normal.     Breath sounds: Normal breath sounds.  Abdominal:     General: Abdomen is flat. Bowel sounds are normal.     Palpations: Abdomen is soft.  Musculoskeletal:     Cervical back: Neck supple.  Skin:    General: Skin is warm and dry.     Coloration: Skin is not jaundiced or pale.  Neurological:     Mental Status: Patient is alert and oriented to person, place, and time.  Psychiatric:        Mood and Affect: Mood normal.        Behavior: Behavior normal.        Judgment: Judgment normal.     MD Evaluation Airway: WNL Heart: WNL Abdomen: WNL Chest/ Lungs: WNL ASA  Classification: 2 Mallampati/Airway Score: Two  Imaging: No results found.  Labs:  CBC: Recent Labs    06/11/21 2143  WBC 9.4  HGB 14.2  HCT 43.7  PLT 460*    COAGS: No results for input(s): "INR", "APTT" in the last 8760 hours.  BMP: Recent Labs    06/12/21 0048  NA 138  K 3.5  CL 102  CO2 28  GLUCOSE 121*  BUN 14  CALCIUM 9.3  CREATININE 0.71  GFRNONAA >60    LIVER FUNCTION TESTS: No results for input(s): "BILITOT",  "AST", "ALT", "ALKPHOS", "PROT", "ALBUMIN" in the last 8760 hours.  TUMOR MARKERS: No results for input(s): "AFPTM", "CEA", "CA199", "CHROMGRNA" in the last 8760 hours.  Assessment and Plan: 56 y.o. female with eleavated alk phos with unclear etiology, who is in  need of random liver bx for further eval.   IR was requested for image guided random liver bx by GI.  N.p.o. since 7 am VS hypertensive 170/98 - Dr. Kathlene Cote notified, 10 mg IV hydralazine  ordered.  Labs pending Not on AC/AP  Risks and benefits of random liver biopsy was discussed with the patient and/or patient's family including, but not limited to bleeding, infection, damage to adjacent structures or low yield requiring additional tests.  All of the questions were answered and there is agreement to proceed.  Consent signed and in chart.   Thank you for this interesting consult.  I greatly enjoyed meeting Haley Harding and look forward to participating in their care.  A copy of this report was sent to the requesting provider on this date.  Electronically Signed: Tera Mater, PA-C 06/03/2022, 11:55 AM   I spent a total of  30 Minutes   in face to face in clinical consultation, greater than 50% of which was counseling/coordinating care for random liver biopsy.  This chart was dictated using voice recognition software.  Despite best efforts to proofread,  errors can occur which can change the documentation meaning.

## 2022-06-03 NOTE — Progress Notes (Signed)
Received call from Short Stay, patient is reporting 10/10 pain and nausea after random liver bx.   PO percocet and IV Zofran ordered, patient continue to report 10/10 pain. IV Fentanyl 50 mcg ordered, and patient evaluated at bedside around 3 pm.  VSS at 1452 hours.  She is laying on left side, not in acute distress.  Reports pain is better after the IV pain medicine, now at 4.  Reports that the pain starts in RUQ but wraps around her upper abdomen.  No nausea.  RUQ puncture site c/d/I, sits is soft, no ecchymosis.  Informed the patient that IR will continue to monitor.   Another call received from Short Stay, patient again reporting 10/10 pain and also throwing up.  IV 1g Dilaudid ordered, STAT CT Abd w/o ordered.  CT reviewed by Dr. Kathlene Cote, acute subcapsular hemorrhage adjacent to the posterior and inferior right lobe of the liver following liver biopsy noted.  STAT H/H ordered, TRH contacted for overnight observation for pain control as well as management of acute bleeding after liver bx.   Admission accepted by Dr. Eliseo Squires.   Patient seen at bedside in Short Stay with Dr. Kathlene Cote.  Patient was informed CT result, and she will be admitted for overnight observation for pain control and further eval of post biopsy bleeding.  Dr. Kathlene Cote informed the patient that post liver bx bleeding resolved spontaneously most of the time, but there is an chance that patient may need blood transfusion and hepatic angiogram if she continues to show s/s of persistent bleeding.  Patient verbalized understanding.   IR to follow over the weekend.  Please call IR radiologist on call for questions and concerns over the weekend.   Armando Gang Kalix Meinecke PA-C 06/03/2022 4:34 PM

## 2022-06-04 DIAGNOSIS — I10 Essential (primary) hypertension: Secondary | ICD-10-CM | POA: Diagnosis not present

## 2022-06-04 DIAGNOSIS — K760 Fatty (change of) liver, not elsewhere classified: Secondary | ICD-10-CM | POA: Diagnosis not present

## 2022-06-04 DIAGNOSIS — Z6832 Body mass index (BMI) 32.0-32.9, adult: Secondary | ICD-10-CM | POA: Diagnosis not present

## 2022-06-04 DIAGNOSIS — R52 Pain, unspecified: Secondary | ICD-10-CM | POA: Diagnosis not present

## 2022-06-04 DIAGNOSIS — F411 Generalized anxiety disorder: Secondary | ICD-10-CM | POA: Diagnosis not present

## 2022-06-04 DIAGNOSIS — R109 Unspecified abdominal pain: Secondary | ICD-10-CM | POA: Diagnosis not present

## 2022-06-04 DIAGNOSIS — Y838 Other surgical procedures as the cause of abnormal reaction of the patient, or of later complication, without mention of misadventure at the time of the procedure: Secondary | ICD-10-CM | POA: Diagnosis not present

## 2022-06-04 DIAGNOSIS — R7989 Other specified abnormal findings of blood chemistry: Secondary | ICD-10-CM | POA: Diagnosis not present

## 2022-06-04 DIAGNOSIS — E669 Obesity, unspecified: Secondary | ICD-10-CM | POA: Diagnosis not present

## 2022-06-04 DIAGNOSIS — K91840 Postprocedural hemorrhage and hematoma of a digestive system organ or structure following a digestive system procedure: Secondary | ICD-10-CM | POA: Diagnosis not present

## 2022-06-04 LAB — BASIC METABOLIC PANEL
Anion gap: 3 — ABNORMAL LOW (ref 5–15)
BUN: 13 mg/dL (ref 6–20)
CO2: 28 mmol/L (ref 22–32)
Calcium: 8.8 mg/dL — ABNORMAL LOW (ref 8.9–10.3)
Chloride: 110 mmol/L (ref 98–111)
Creatinine, Ser: 0.82 mg/dL (ref 0.44–1.00)
GFR, Estimated: >60 mL/min (ref >60)
Glucose, Bld: 119 mg/dL — ABNORMAL HIGH (ref 70–99)
Potassium: 4.1 mmol/L (ref 3.5–5.1)
Sodium: 141 mmol/L (ref 135–145)

## 2022-06-04 LAB — HEMOGLOBIN AND HEMATOCRIT, BLOOD
HCT: 36.5 % (ref 36.0–46.0)
Hemoglobin: 11.9 g/dL — ABNORMAL LOW (ref 12.0–15.0)

## 2022-06-04 LAB — HEPATIC FUNCTION PANEL
ALT: 72 U/L — ABNORMAL HIGH (ref 0–44)
AST: 72 U/L — ABNORMAL HIGH (ref 15–41)
Albumin: 3.5 g/dL (ref 3.5–5.0)
Alkaline Phosphatase: 142 U/L — ABNORMAL HIGH (ref 38–126)
Bilirubin, Direct: 0.1 mg/dL (ref 0.0–0.2)
Indirect Bilirubin: 0.4 mg/dL (ref 0.3–0.9)
Total Bilirubin: 0.5 mg/dL (ref 0.3–1.2)
Total Protein: 7.1 g/dL (ref 6.5–8.1)

## 2022-06-04 LAB — CBC
HCT: 36.5 % (ref 36.0–46.0)
Hemoglobin: 11.6 g/dL — ABNORMAL LOW (ref 12.0–15.0)
MCH: 27.7 pg (ref 26.0–34.0)
MCHC: 31.8 g/dL (ref 30.0–36.0)
MCV: 87.1 fL (ref 80.0–100.0)
Platelets: 375 10*3/uL (ref 150–400)
RBC: 4.19 MIL/uL (ref 3.87–5.11)
RDW: 14.3 % (ref 11.5–15.5)
WBC: 8.5 10*3/uL (ref 4.0–10.5)
nRBC: 0 % (ref 0.0–0.2)

## 2022-06-04 MED ORDER — TRAMADOL HCL 50 MG PO TABS
50.0000 mg | ORAL_TABLET | Freq: Four times a day (QID) | ORAL | Status: DC | PRN
  Administered 2022-06-04: 50 mg via ORAL
  Filled 2022-06-04 (×2): qty 1

## 2022-06-04 MED ORDER — TRAMADOL HCL 50 MG PO TABS
50.0000 mg | ORAL_TABLET | Freq: Four times a day (QID) | ORAL | 0 refills | Status: DC | PRN
Start: 2022-06-04 — End: 2023-12-31

## 2022-06-04 MED ORDER — PANTOPRAZOLE SODIUM 40 MG PO TBEC
40.0000 mg | DELAYED_RELEASE_TABLET | Freq: Every day | ORAL | Status: DC
Start: 2022-06-04 — End: 2022-06-04
  Administered 2022-06-04: 40 mg via ORAL
  Filled 2022-06-04: qty 1

## 2022-06-04 MED ORDER — LISINOPRIL 5 MG PO TABS
5.0000 mg | ORAL_TABLET | Freq: Every day | ORAL | Status: DC
  Administered 2022-06-04: 5 mg via ORAL
  Filled 2022-06-04: qty 1

## 2022-06-04 MED ORDER — SERTRALINE HCL 100 MG PO TABS: 200.0000 mg | ORAL_TABLET | Freq: Every day | ORAL | Status: DC

## 2022-06-04 MED ORDER — CALCIUM CARBONATE ANTACID 500 MG PO CHEW
1.0000 | CHEWABLE_TABLET | Freq: Two times a day (BID) | ORAL | Status: DC | PRN
  Administered 2022-06-04: 200 mg via ORAL
  Filled 2022-06-04: qty 1

## 2022-06-04 MED ORDER — BUPROPION HCL ER (XL) 300 MG PO TB24
300.0000 mg | ORAL_TABLET | Freq: Every day | ORAL | Status: DC
  Administered 2022-06-04: 300 mg via ORAL
  Filled 2022-06-04: qty 1

## 2022-06-04 MED ORDER — AMLODIPINE BESYLATE 5 MG PO TABS
5.0000 mg | ORAL_TABLET | Freq: Every day | ORAL | Status: DC
  Administered 2022-06-04: 5 mg via ORAL
  Filled 2022-06-04: qty 1

## 2022-06-04 MED ORDER — SERTRALINE HCL 100 MG PO TABS
200.0000 mg | ORAL_TABLET | Freq: Every day | ORAL | Status: DC
  Administered 2022-06-04: 200 mg via ORAL
  Filled 2022-06-04: qty 2

## 2022-06-04 NOTE — TOC CM/SW Note (Signed)
  Transition of Care [facility]) Screening Note   Patient Details  Name: BRECKEN WALTH Date of Birth: 02/28/1966   Transition of Care [facility]) CM/SW Contact:    Ross Ludwig, LCSW Phone Number: 06/04/2022, 2:47 PM    Transition of Care Department [facility]) has reviewed patient and no TOC needs have been identified at this time. We will continue to monitor patient advancement through interdisciplinary progression rounds. If new patient transition needs arise, please place a TOC consult.

## 2022-06-04 NOTE — Progress Notes (Signed)
Referring Physician(s): Ronnette Juniper  Supervising Physician: Ruthann Cancer  Patient Status:  [facility] - In-pt  Chief Complaint:  1 day s/p liver biopsy complicated bysubcapsular hemorrhage   Subjective:  Feeling much better today.  Minimal pain- localized LUQ.    Allergies: Duloxetine  Medications: Prior to Admission medications   Medication Sig Start Date End Date Taking? Authorizing Provider  amLODipine (NORVASC) 5 MG tablet Take 1 tablet (5 mg total) by mouth daily. 11/20/15  Yes Janith Lima, MD  buPROPion (WELLBUTRIN XL) 300 MG 24 hr tablet Take 300 mg by mouth daily.   Yes [provider]  glipiZIDE (GLUCOTROL XL) 10 MG 24 hr tablet Take 10 mg by mouth daily with breakfast.   Yes [provider]  ipratropium (ATROVENT) 0.06 % nasal spray Place 1 spray into both nostrils daily as needed for rhinitis. 03/25/22  Yes [provider]  lisinopril (ZESTRIL) 5 MG tablet Take 5 mg by mouth daily.   Yes [provider]  metoprolol tartrate (LOPRESSOR) 25 MG tablet Take 25 mg by mouth daily.   Yes [provider]  omeprazole (PRILOSEC) 20 MG capsule Take 1 capsule (20 mg total) by mouth daily. Yearly physical  due in Sept must see md for refills Patient taking differently: Take 20 mg by mouth daily as needed (heart burn). 05/11/16  Yes Janith Lima, MD  pravastatin (PRAVACHOL) 20 MG tablet Take 20 mg by mouth daily.   Yes [provider]  sertraline (ZOLOFT) 100 MG tablet Take 1 tablet (100 mg total) by mouth daily. Patient taking differently: Take 200 mg by mouth daily. 08/12/16  Yes Biagio Borg, MD  tretinoin (RETIN-A) 0.05 % cream Apply 1 Application topically daily as needed (bre ak out). 03/20/22  Yes [provider]  valACYclovir (VALTREX) 500 MG tablet Take 500 mg by mouth daily as needed (breakout).   Yes [provider]  acyclovir (ZOVIRAX) 800 MG tablet Take 1 tablet (800 mg total) by mouth 3 (three) times  daily. Patient not taking: Reported on 06/03/2022 11/20/15   Janith Lima, MD  hydrochlorothiazide (MICROZIDE) 12.5 MG capsule Take 1 capsule (12.5 mg total) by mouth daily. Yearly physical due in Sept must see MD for refills Patient not taking: Reported on 06/03/2022 05/11/16   Janith Lima, MD  iron polysaccharides (NIFEREX) 150 MG capsule Take 1 capsule (150 mg total) by mouth daily. Patient not taking: Reported on 06/03/2022 01/08/18   Brunetta Genera, MD  potassium chloride SA (K-DUR,KLOR-CON) 20 MEQ tablet Take 1 tablet (20 mEq total) by mouth daily. Yearly physical due in Sept must see MD for refills Patient not taking: Reported on 06/03/2022 05/11/16   Janith Lima, MD     Vital Signs: BP (!) 154/89 (BP Location: Left Arm)   Pulse 86   Temp 98.2 F (36.8 C) (Oral)   Resp 17   Ht '5\' 7"'$  (1.702 m)   Wt 210 lb (95.3 kg)   SpO2 96%   BMI 32.89 kg/m   Physical Exam Vitals reviewed.  Constitutional:      General: She is not in acute distress.    Appearance: She is well-developed. She is not ill-appearing.  HENT:     Head: Normocephalic and atraumatic.     Mouth/Throat:     Pharynx: Oropharynx is clear.  Eyes:     Pupils: Pupils are equal, round, and reactive to light.  Cardiovascular:     Rate and Rhythm: Normal rate.  Pulmonary:     Effort: Pulmonary effort is normal. No respiratory distress.  Abdominal:     General: Abdomen is flat.     Palpations: Abdomen is soft.     Tenderness: There is abdominal tenderness in the left upper quadrant.  Skin:    General: Skin is warm and dry.  Neurological:     General: No focal deficit present.     Mental Status: She is alert.  Psychiatric:        Mood and Affect: Mood normal.        Behavior: Behavior normal.     Imaging: CT ABDOMEN WO CONTRAST  Result Date: 06/03/2022 CLINICAL DATA:  Significant pain after random biopsy of the liver earlier today. EXAM: CT ABDOMEN WITHOUT CONTRAST TECHNIQUE: Multidetector CT  imaging of the abdomen was performed following the standard protocol without IV contrast. RADIATION DOSE REDUCTION: This exam was performed according to the departmental dose-optimization program which includes automated exposure control, adjustment of the mA and/or kV according to patient size and/or use of iterative reconstruction technique. COMPARISON:  Imaging during ultrasound-guided biopsy of the liver earlier today. FINDINGS: Lower chest: No acute abnormality.  Moderate-sized hiatal hernia. Hepatobiliary: Subcapsular hemorrhage present around the inferior aspect of the right lobe of the liver following biopsy. There also is a small amount of pneumobilia. Maximal thickness of the subcapsular hemorrhage posteriorly is approximately 2.8 cm. A small amount of hemorrhage tracks anteriorly and abuts the stomach. Pancreas: Unremarkable. No pancreatic ductal dilatation or surrounding inflammatory changes. Spleen: Normal in size without focal abnormality. Adrenals/Urinary Tract: Adrenal glands are unremarkable. Kidneys are normal, without renal calculi, focal lesion, or hydronephrosis. Stomach/Bowel: Bowel shows no evidence of obstruction, ileus, inflammation or lesion. No free intraperitoneal air. Vascular/Lymphatic: No significant vascular findings are present. No enlarged lymph nodes. Other: No abdominal wall hernia or abnormality. Musculoskeletal: No acute or significant osseous findings. IMPRESSION: Acute subcapsular hemorrhage adjacent to the posterior and inferior right lobe of the liver following liver biopsy. There is small component of intraperitoneal blood anteriorly that abuts the stomach also. Electronically Signed   By: Aletta Edouard M.D.   On: 06/03/2022 16:07   US BIOPSY (LIVER)  Result Date: 06/03/2022 INDICATION: Persistent elevated alkaline phosphatase level and need for random liver biopsy. EXAM: ULTRASOUND GUIDED CORE BIOPSY OF LIVER MEDICATIONS: None. ANESTHESIA/SEDATION: Moderate  (conscious) sedation was employed during this procedure. A total of Versed 2.0 mg and Fentanyl 100 mcg was administered intravenously by radiology nursing. Moderate Sedation Time: 12 minutes. The patient's level of consciousness and vital signs were monitored continuously by radiology nursing throughout the procedure under my direct supervision. PROCEDURE: The procedure, risks, benefits, and alternatives were explained to the patient. Questions regarding the procedure were encouraged and answered. The patient understands and consents to the procedure. A time-out was performed prior to initiating the procedure. The right abdominal wall was prepped with chlorhexidine in a sterile fashion, and a sterile drape was applied covering the operative field. A sterile gown and sterile gloves were used for the procedure. Local anesthesia was provided with 1% Lidocaine. A 17 gauge trocar needle was advanced into the right lobe of the liver under ultrasound guidance. After confirming needle tip position, 2 separate coaxial 18 gauge core biopsy samples were obtained and submitted in formalin. Gel-Foam pledgets were advanced through the outer needle as it was retracted and removed. Additional ultrasound was performed. COMPLICATIONS: None immediate. FINDINGS: Solid core biopsy samples were obtained from the liver. IMPRESSION: Ultrasound-guided core biopsy performed within  the right lobe of the liver. Electronically Signed   By: Aletta Edouard M.D.   On: 06/03/2022 15:42    Labs:  CBC: Recent Labs    06/11/21 2143 06/03/22 1130 06/03/22 1610 06/03/22 2028 06/04/22 0544 06/04/22 1153  WBC 9.4 7.6  --   --  8.5  --   HGB 14.2 13.6 12.5 11.8* 11.6* 11.9*  HCT 43.7 41.5 38.0 36.2 36.5 36.5  PLT 460* 404*  --   --  375  --     COAGS: Recent Labs    06/03/22 1130  INR 1.0    BMP: Recent Labs    06/12/21 0048 06/03/22 1130 06/04/22 0544  NA 138 141 141  K 3.5 3.8 4.1  CL 102 107 110  CO2 '28 25 28   '$ GLUCOSE 121* 88 119*  BUN '14 12 13  '$ CALCIUM 9.3 9.4 8.8*  CREATININE 0.71 0.65 0.82  GFRNONAA >60 >60 >60    LIVER FUNCTION TESTS: Recent Labs    06/03/22 1130 06/04/22 1148  BILITOT 0.5 0.5  AST 16 72*  ALT 14 72*  ALKPHOS 162* 142*  PROT 7.6 7.1  ALBUMIN 3.9 3.5    Assessment and Plan:  1 day s/p liver biopsy complicated by subcapsular hemorrhage. --hemoglobin has stabilized --pain is controlled --no angiogram needed at this time --no barriers to discharge from IR perspective.   Electronically Signed: Pasty Spillers, PA 06/04/2022, 2:15 PM   I spent a total of 25 Minutes at the the patient's bedside AND on the patient's hospital floor or unit, greater than 50% of which was counseling/coordinating care for hemorrhage s/p liver biopsy.

## 2022-06-04 NOTE — Discharge Summary (Signed)
Physician Discharge Summary  CHANTELL KUNKLER XHB:[id number] DOB: 01-30-1966 DOA: 06/03/2022  PCP: Donald Prose, MD  Admit date: 06/03/2022 Discharge date: 06/04/2022  Admitted From: home Discharge disposition: home   Recommendations for Outpatient Follow-Up:   Biopsy results pending   Discharge Diagnosis:   Principal Problem:   Pain Active Problems:   Essential hypertension, benign   GAD (generalized anxiety disorder)   Subcapsular hemorrhage of liver    Discharge Condition: Improved.  Diet recommendation: Low sodium, heart healthy.  Carbohydrate-modified.  Wound care: None.  Code status: Full.   History of Present Illness:   Haley Harding is a 56 y.o. female with medical history significant of HTN, anxiety/depression who was sent by GI to IR for a liver biopsy.   Liver biopsy was successful and samples sent to pathology.  After procedure, patient complained of 10/10 abdominal pain.  She was seen by IR and CT scan done that showed: Acute subcapsular hemorrhage adjacent to the posterior and inferior right lobe of the liver following liver biopsy. There is small component of intraperitoneal blood anteriorly that abuts the stomach.   In short stay, patient was given IV dilaudid, fentanyl and also PO pain meds without much improvement.   She also had some vomiting.  H/h done that showed hgb 12.5 from 13.6.   Hospital Course by Problem:   1 day s/p liver biopsy complicated by subcapsular hemorrhage. --hemoglobin has stabilized --pain is controlled --no angiogram needed at this time --ok to discharge from IR perspective  HTN -resume home meds   Medical Consultants:   IR   Discharge Exam:   Vitals:   06/04/22 0524 06/04/22 1235  BP: 111/74 (!) 154/89  Pulse: 83 86  Resp: 18 17  Temp: 98.5 F (36.9 C) 98.2 F (36.8 C)  SpO2: 97% 96%   Vitals:   06/03/22 2114 06/04/22 0127 06/04/22 0524 06/04/22 1235  BP: 122/74 117/72 111/74 (!) 154/89   Pulse: 95 88 83 86  Resp: '19 16 18 17  '$ Temp: 98.8 F (37.1 C) 98.3 F (36.8 C) 98.5 F (36.9 C) 98.2 F (36.8 C)  TempSrc:  Oral  Oral  SpO2: 95% 98% 97% 96%  Weight:      Height:        General exam: Appears calm and comfortable. .    The results of significant diagnostics from this hospitalization (including imaging, microbiology, ancillary and laboratory) are listed below for reference.     Procedures and Diagnostic Studies:   CT ABDOMEN WO CONTRAST  Result Date: 06/03/2022 CLINICAL DATA:  Significant pain after random biopsy of the liver earlier today. EXAM: CT ABDOMEN WITHOUT CONTRAST TECHNIQUE: Multidetector CT imaging of the abdomen was performed following the standard protocol without IV contrast. RADIATION DOSE REDUCTION: This exam was performed according to the departmental dose-optimization program which includes automated exposure control, adjustment of the mA and/or kV according to patient size and/or use of iterative reconstruction technique. COMPARISON:  Imaging during ultrasound-guided biopsy of the liver earlier today. FINDINGS: Lower chest: No acute abnormality.  Moderate-sized hiatal hernia. Hepatobiliary: Subcapsular hemorrhage present around the inferior aspect of the right lobe of the liver following biopsy. There also is a small amount of pneumobilia. Maximal thickness of the subcapsular hemorrhage posteriorly is approximately 2.8 cm. A small amount of hemorrhage tracks anteriorly and abuts the stomach. Pancreas: Unremarkable. No pancreatic ductal dilatation or surrounding inflammatory changes. Spleen: Normal in size without focal abnormality. Adrenals/Urinary Tract: Adrenal glands are unremarkable.  Kidneys are normal, without renal calculi, focal lesion, or hydronephrosis. Stomach/Bowel: Bowel shows no evidence of obstruction, ileus, inflammation or lesion. No free intraperitoneal air. Vascular/Lymphatic: No significant vascular findings are present. No enlarged lymph  nodes. Other: No abdominal wall hernia or abnormality. Musculoskeletal: No acute or significant osseous findings. IMPRESSION: Acute subcapsular hemorrhage adjacent to the posterior and inferior right lobe of the liver following liver biopsy. There is small component of intraperitoneal blood anteriorly that abuts the stomach also. Electronically Signed   By: Aletta Edouard M.D.   On: 06/03/2022 16:07   US BIOPSY (LIVER)  Result Date: 06/03/2022 INDICATION: Persistent elevated alkaline phosphatase level and need for random liver biopsy. EXAM: ULTRASOUND GUIDED CORE BIOPSY OF LIVER MEDICATIONS: None. ANESTHESIA/SEDATION: Moderate (conscious) sedation was employed during this procedure. A total of Versed 2.0 mg and Fentanyl 100 mcg was administered intravenously by radiology nursing. Moderate Sedation Time: 12 minutes. The patient's level of consciousness and vital signs were monitored continuously by radiology nursing throughout the procedure under my direct supervision. PROCEDURE: The procedure, risks, benefits, and alternatives were explained to the patient. Questions regarding the procedure were encouraged and answered. The patient understands and consents to the procedure. A time-out was performed prior to initiating the procedure. The right abdominal wall was prepped with chlorhexidine in a sterile fashion, and a sterile drape was applied covering the operative field. A sterile gown and sterile gloves were used for the procedure. Local anesthesia was provided with 1% Lidocaine. A 17 gauge trocar needle was advanced into the right lobe of the liver under ultrasound guidance. After confirming needle tip position, 2 separate coaxial 18 gauge core biopsy samples were obtained and submitted in formalin. Gel-Foam pledgets were advanced through the outer needle as it was retracted and removed. Additional ultrasound was performed. COMPLICATIONS: None immediate. FINDINGS: Solid core biopsy samples were obtained from  the liver. IMPRESSION: Ultrasound-guided core biopsy performed within the right lobe of the liver. Electronically Signed   By: Aletta Edouard M.D.   On: 06/03/2022 15:42     Labs:   Basic Metabolic Panel: Recent Labs  Lab 06/03/22 1130 06/04/22 0544  NA 141 141  K 3.8 4.1  CL 107 110  CO2 25 28  GLUCOSE 88 119*  BUN 12 13  CREATININE 0.65 0.82  CALCIUM 9.4 8.8*   GFR Estimated Creatinine Clearance: 93 mL/min (by C-G formula based on SCr of 0.82 mg/dL). Liver Function Tests: Recent Labs  Lab 06/03/22 1130 06/04/22 1148  AST 16 72*  ALT 14 72*  ALKPHOS 162* 142*  BILITOT 0.5 0.5  PROT 7.6 7.1  ALBUMIN 3.9 3.5   No results for input(s): "LIPASE", "AMYLASE" in the last 168 hours. No results for input(s): "AMMONIA" in the last 168 hours. Coagulation profile Recent Labs  Lab 06/03/22 1130  INR 1.0    CBC: Recent Labs  Lab 06/03/22 1130 06/03/22 1610 06/03/22 2028 06/04/22 0544 06/04/22 1153  WBC 7.6  --   --  8.5  --   NEUTROABS 5.0  --   --   --   --   HGB 13.6 12.5 11.8* 11.6* 11.9*  HCT 41.5 38.0 36.2 36.5 36.5  MCV 84.7  --   --  87.1  --   PLT 404*  --   --  375  --    Cardiac Enzymes: No results for input(s): "CKTOTAL", "CKMB", "CKMBINDEX", "TROPONINI" in the last 168 hours. BNP: Invalid input(s): "POCBNP" CBG: Recent Labs  Lab 06/03/22 [place]  91   D-Dimer No results for input(s): "DDIMER" in the last 72 hours. Hgb A1c No results for input(s): "HGBA1C" in the last 72 hours. Lipid Profile No results for input(s): "CHOL", "HDL", "LDLCALC", "TRIG", "CHOLHDL", "LDLDIRECT" in the last 72 hours. Thyroid function studies No results for input(s): "TSH", "T4TOTAL", "T3FREE", "THYROIDAB" in the last 72 hours.  Invalid input(s): "FREET3" Anemia work up No results for input(s): "VITAMINB12", "FOLATE", "FERRITIN", "TIBC", "IRON", "RETICCTPCT" in the last 72 hours. Microbiology No results found for this or any previous visit (from the past 240  hour(s)).   Discharge Instructions:   Discharge Instructions     Diet - low sodium heart healthy   Complete by: As directed    Increase activity slowly   Complete by: As directed    No wound care   Complete by: As directed       Allergies as of 06/04/2022       Reactions   Duloxetine    headache        Medication List     STOP taking these medications    hydrochlorothiazide 12.5 MG capsule Commonly known as: MICROZIDE   iron polysaccharides 150 MG capsule Commonly known as: NIFEREX   potassium chloride SA 20 MEQ tablet Commonly known as: KLOR-CON M       TAKE these medications    acyclovir 800 MG tablet Commonly known as: ZOVIRAX Take 1 tablet (800 mg total) by mouth 3 (three) times daily.   amLODipine 5 MG tablet Commonly known as: NORVASC Take 1 tablet (5 mg total) by mouth daily.   buPROPion 300 MG 24 hr tablet Commonly known as: WELLBUTRIN XL Take 300 mg by mouth daily.   glipiZIDE 10 MG 24 hr tablet Commonly known as: GLUCOTROL XL Take 10 mg by mouth daily with breakfast.   ipratropium 0.06 % nasal spray Commonly known as: ATROVENT Place 1 spray into both nostrils daily as needed for rhinitis.   lisinopril 5 MG tablet Commonly known as: ZESTRIL Take 5 mg by mouth daily.   metoprolol tartrate 25 MG tablet Commonly known as: LOPRESSOR Take 25 mg by mouth daily.   omeprazole 20 MG capsule Commonly known as: PRILOSEC Take 1 capsule (20 mg total) by mouth daily. Yearly physical  due in Sept must see md for refills What changed:  when to take this reasons to take this additional instructions   pravastatin 20 MG tablet Commonly known as: PRAVACHOL Take 20 mg by mouth daily.   sertraline 100 MG tablet Commonly known as: ZOLOFT Take 2 tablets (200 mg total) by mouth daily.   traMADol 50 MG tablet Commonly known as: ULTRAM Take 1 tablet (50 mg total) by mouth every 6 (six) hours as needed for severe pain.   tretinoin 0.05 %  cream Commonly known as: RETIN-A Apply 1 Application topically daily as needed (bre ak out).   valACYclovir 500 MG tablet Commonly known as: VALTREX Take 500 mg by mouth daily as needed (breakout).        Follow-up Information     Donald Prose, MD Follow up in 1 week(s).   Specialty: Family Medicine Contact information: [place] [address] [phone]         Ronnette Juniper, MD Follow up.   Specialty: Gastroenterology Contact information: [address] [place] [address] [phone]                  Time coordinating discharge: 37  min  Signed:  Geradine Girt DO  [facility] 06/04/2022, 2:39 PM

## 2022-06-07 DIAGNOSIS — I10 Essential (primary) hypertension: Secondary | ICD-10-CM | POA: Diagnosis not present

## 2022-06-07 DIAGNOSIS — E1169 Type 2 diabetes mellitus with other specified complication: Secondary | ICD-10-CM | POA: Diagnosis not present

## 2022-06-07 DIAGNOSIS — R58 Hemorrhage, not elsewhere classified: Secondary | ICD-10-CM | POA: Diagnosis not present

## 2022-06-07 LAB — SURGICAL PATHOLOGY

## 2022-06-16 DIAGNOSIS — Z13220 Encounter for screening for lipoid disorders: Secondary | ICD-10-CM | POA: Diagnosis not present

## 2022-06-21 ENCOUNTER — Emergency Department (HOSPITAL_COMMUNITY): Payer: BC Managed Care – PPO

## 2022-06-21 ENCOUNTER — Other Ambulatory Visit: Payer: Self-pay

## 2022-06-21 ENCOUNTER — Emergency Department (HOSPITAL_COMMUNITY)
Admission: EM | Admit: 2022-06-21 | Discharge: 2022-06-21 | Disposition: A | Discharge status: Home / Self Care | Payer: BC Managed Care – PPO | Attending: Emergency Medicine | Admitting: Emergency Medicine

## 2022-06-21 ENCOUNTER — Encounter (HOSPITAL_COMMUNITY): Payer: Self-pay

## 2022-06-21 DIAGNOSIS — R1010 Upper abdominal pain, unspecified: Secondary | ICD-10-CM | POA: Diagnosis not present

## 2022-06-21 DIAGNOSIS — K7689 Other specified diseases of liver: Secondary | ICD-10-CM | POA: Diagnosis not present

## 2022-06-21 DIAGNOSIS — R822 Biliuria: Secondary | ICD-10-CM | POA: Diagnosis not present

## 2022-06-21 DIAGNOSIS — R82998 Other abnormal findings in urine: Secondary | ICD-10-CM | POA: Diagnosis not present

## 2022-06-21 DIAGNOSIS — R1012 Left upper quadrant pain: Secondary | ICD-10-CM

## 2022-06-21 LAB — URINALYSIS, ROUTINE W REFLEX MICROSCOPIC
Bilirubin Urine: NEGATIVE
Glucose, UA: NEGATIVE mg/dL
Hgb urine dipstick: NEGATIVE
Ketones, ur: 5 mg/dL — AB
Leukocytes,Ua: NEGATIVE
Nitrite: NEGATIVE
Protein, ur: 30 mg/dL — AB
Specific Gravity, Urine: 1.026 (ref 1.005–1.030)
pH: 5 (ref 5.0–8.0)

## 2022-06-21 LAB — LIPASE, BLOOD: Lipase: 22 U/L (ref 11–51)

## 2022-06-21 LAB — CBC WITH DIFFERENTIAL/PLATELET
Abs Immature Granulocytes: 0.03 10*3/uL (ref 0.00–0.07)
Basophils Absolute: 0.1 10*3/uL (ref 0.0–0.1)
Basophils Relative: 1 %
Eosinophils Absolute: 0.2 10*3/uL (ref 0.0–0.5)
Eosinophils Relative: 2 %
HCT: 42.7 % (ref 36.0–46.0)
Hemoglobin: 13.9 g/dL (ref 12.0–15.0)
Immature Granulocytes: 0 %
Lymphocytes Relative: 21 %
Lymphs Abs: 1.6 10*3/uL (ref 0.7–4.0)
MCH: 27.7 pg (ref 26.0–34.0)
MCHC: 32.6 g/dL (ref 30.0–36.0)
MCV: 85.2 fL (ref 80.0–100.0)
Monocytes Absolute: 0.4 10*3/uL (ref 0.1–1.0)
Monocytes Relative: 5 %
Neutro Abs: 5.6 10*3/uL (ref 1.7–7.7)
Neutrophils Relative %: 71 %
Platelets: 604 10*3/uL — ABNORMAL HIGH (ref 150–400)
RBC: 5.01 MIL/uL (ref 3.87–5.11)
RDW: 14 % (ref 11.5–15.5)
WBC: 7.8 10*3/uL (ref 4.0–10.5)
nRBC: 0 % (ref 0.0–0.2)

## 2022-06-21 LAB — COMPREHENSIVE METABOLIC PANEL
ALT: 19 U/L (ref 0–44)
AST: 18 U/L (ref 15–41)
Albumin: 4.1 g/dL (ref 3.5–5.0)
Alkaline Phosphatase: 170 U/L — ABNORMAL HIGH (ref 38–126)
Anion gap: 6 (ref 5–15)
BUN: 12 mg/dL (ref 6–20)
CO2: 27 mmol/L (ref 22–32)
Calcium: 9.8 mg/dL (ref 8.9–10.3)
Chloride: 107 mmol/L (ref 98–111)
Creatinine, Ser: 0.82 mg/dL (ref 0.44–1.00)
GFR, Estimated: >60 mL/min (ref >60)
Glucose, Bld: 102 mg/dL — ABNORMAL HIGH (ref 70–99)
Potassium: 3.8 mmol/L (ref 3.5–5.1)
Sodium: 140 mmol/L (ref 135–145)
Total Bilirubin: 0.4 mg/dL (ref 0.3–1.2)
Total Protein: 8.2 g/dL — ABNORMAL HIGH (ref 6.5–8.1)

## 2022-06-21 MED ORDER — IOHEXOL 300 MG/ML  SOLN
100.0000 mL | Freq: Once | INTRAMUSCULAR | Status: AC | PRN
  Administered 2022-06-21: 100 mL via INTRAVENOUS

## 2022-06-21 MED ORDER — SODIUM CHLORIDE 0.9 % IV BOLUS: 1000.0000 mL | Freq: Once | INTRAVENOUS | Status: DC

## 2022-06-21 MED ORDER — METOCLOPRAMIDE HCL 5 MG/ML IJ SOLN: 10.0000 mg | Freq: Once | INTRAMUSCULAR | Status: DC

## 2022-06-21 MED ORDER — PANTOPRAZOLE SODIUM 40 MG IV SOLR
40.0000 mg | Freq: Once | INTRAVENOUS | Status: AC
  Administered 2022-06-21: 40 mg via INTRAVENOUS
  Filled 2022-06-21: qty 10

## 2022-06-21 MED ORDER — FENTANYL CITRATE PF 50 MCG/ML IJ SOSY: 50.0000 ug | PREFILLED_SYRINGE | Freq: Once | INTRAMUSCULAR | Status: DC

## 2022-06-21 NOTE — ED Provider Triage Note (Addendum)
Emergency Medicine Provider Triage Evaluation Note  Haley Harding , a 56 y.o. female  was evaluated in triage.  Pt complains of abdominal pain, nausea, and vomiting. Had a liver biopsy 1.5 weeks ago and initially had 10/10 pain following that subsequently improved but worsened several days ago. States that she has not been able to eat in a week due to pain. Went to [facility] walk in clinic and was found to have bilirubinuria and was sent here for CT imaging with concern for complication from biopsy causing symptoms.  Review of Systems  Positive:  Negative:   Physical Exam  BP (!) 156/108 (BP Location: Left Arm)   Pulse 97   Temp 97.9 F (36.6 C) (Oral)   Resp 18   SpO2 100%  Gen:   Awake, no distress   Resp:  Normal effort  MSK:   Moves extremities without difficulty  Other:    Medical Decision Making  Medically screening exam initiated at 12:32 PM.  Appropriate orders placed.  Haley Harding was informed that the remainder of the evaluation will be completed by another provider, this initial triage assessment does not replace that evaluation, and the importance of remaining in the ED until their evaluation is complete.  Reviewed urine dip from [place] that patient presents with today. Reviewed at patients request as she doesn't think she can give a urine sample and is frustrated that she has to submit another sample here today. Result are as follows:  '100mg'$  protein, trace ketones, 2.0 UBG, small bilirubin, no blood. Otherwise negative     Haley Harding 06/21/22 1235    Haley Harding, Leary Roca, PA-C 06/21/22 1359

## 2022-06-21 NOTE — ED Triage Notes (Signed)
Pt reports going to [facility] walk in clinic for RUQ pain and N/V that has caused her to have loss of appetite. Pt reports having a liver biopsy on 6/19. Pt reports she was sent here for further evaluation and CT scan.

## 2022-06-21 NOTE — Discharge Instructions (Signed)
Please follow-up with your gastroenterologist as well as your primary care doctor.  Call both of their clinics tomorrow morning to get follow-up soon as possible.  Try to drink plenty of fluids.  I would recommend trying Ensure because this has significant nutrition with it.  If you feel your pain is worsening, you develop vomiting, any fever, lightheadedness, shortness of breath or other new concerning symptom, please return to ER for reassessment.

## 2022-06-21 NOTE — ED Provider Notes (Signed)
[place] DEPT Provider Note   CSN: [id number] Arrival date & time: 06/21/22  1135     History  Chief Complaint  Patient presents with   Abdominal Pain   Emesis    Haley Harding is a 56 y.o. female.  Presents to the emergency permit due to concern for abdominal pain.  On 6/16 patient had outpatient liver biopsy completed by IR.  Procedure was complicated by a subcapsular liver hematoma.  She was admitted to the hospital, serial H&H monitored.  Patient discharged on 6/17.  Patient states that since that time she has been dealing with abdominal pain.  Pain was initially stable but seem to be getting worse over the past week or so.  Pain is worse in her left upper abdomen.  Seems to be worse after eating.  Pain is so bad that she is having a hard time eating anything.  She is able to still drink fluids.  Does have pain with drinking fluids.  Currently she denies any sort of nausea or vomiting.  States that she still has moderate pain even now.  States that heating pad seems to have helped.  Reviewed discharge summary from 6/17, liver biopsy complicated by subcapsular hematoma.  Managed conservatively.  Reviewed pathology report - liver core shows mild steatosis with no other diagnostic  pathologic abnormality  HPI     Home Medications Prior to Admission medications   Medication Sig Start Date End Date Taking? Authorizing Provider  acyclovir (ZOVIRAX) 800 MG tablet Take 1 tablet (800 mg total) by mouth 3 (three) times daily. Patient not taking: Reported on 06/03/2022 11/20/15   Janith Lima, MD  amLODipine (NORVASC) 5 MG tablet Take 1 tablet (5 mg total) by mouth daily. 11/20/15   Janith Lima, MD  buPROPion (WELLBUTRIN XL) 300 MG 24 hr tablet Take 300 mg by mouth daily.    [provider]  glipiZIDE (GLUCOTROL XL) 10 MG 24 hr tablet Take 10 mg by mouth daily with breakfast.    [provider]  ipratropium (ATROVENT) 0.06 % nasal  spray Place 1 spray into both nostrils daily as needed for rhinitis. 03/25/22   [provider]  lisinopril (ZESTRIL) 5 MG tablet Take 5 mg by mouth daily.    [provider]  metoprolol tartrate (LOPRESSOR) 25 MG tablet Take 25 mg by mouth daily.    [provider]  omeprazole (PRILOSEC) 20 MG capsule Take 1 capsule (20 mg total) by mouth daily. Yearly physical  due in Sept must see md for refills Patient taking differently: Take 20 mg by mouth daily as needed (heart burn). 05/11/16   Janith Lima, MD  pravastatin (PRAVACHOL) 20 MG tablet Take 20 mg by mouth daily.    [provider]  sertraline (ZOLOFT) 100 MG tablet Take 2 tablets (200 mg total) by mouth daily. 06/04/22   Geradine Girt, DO  traMADol (ULTRAM) 50 MG tablet Take 1 tablet (50 mg total) by mouth every 6 (six) hours as needed for severe pain. 06/04/22   Geradine Girt, DO  tretinoin (RETIN-A) 0.05 % cream Apply 1 Application topically daily as needed (bre ak out). 03/20/22   [provider]  valACYclovir (VALTREX) 500 MG tablet Take 500 mg by mouth daily as needed (breakout).    [provider]      Allergies    Duloxetine    Review of Systems   Review of Systems  Constitutional:  Negative for chills  and fever.  HENT:  Negative for ear pain and sore throat.   Eyes:  Negative for pain and visual disturbance.  Respiratory:  Negative for cough and shortness of breath.   Cardiovascular:  Negative for chest pain and palpitations.  Gastrointestinal:  Positive for abdominal pain. Negative for vomiting.  Genitourinary:  Negative for dysuria and hematuria.  Musculoskeletal:  Negative for arthralgias and back pain.  Skin:  Negative for color change and rash.  Neurological:  Negative for seizures and syncope.  All other systems reviewed and are negative.   Physical Exam Updated Vital Signs BP (!) 155/99   Pulse 71   Temp 97.9 F (36.6 C) (Oral)   Resp 18   SpO2 100%   Physical Exam Vitals and nursing note reviewed.  Constitutional:      General: She is not in acute distress.    Appearance: She is well-developed.  HENT:     Head: Normocephalic and atraumatic.  Eyes:     Conjunctiva/sclera: Conjunctivae normal.  Cardiovascular:     Rate and Rhythm: Normal rate and regular rhythm.     Heart sounds: No murmur heard. Pulmonary:     Effort: Pulmonary effort is normal. No respiratory distress.     Breath sounds: Normal breath sounds.  Abdominal:     Palpations: Abdomen is soft.     Tenderness: There is abdominal tenderness in the left upper quadrant. There is no guarding or rebound.  Musculoskeletal:        General: No swelling.     Cervical back: Neck supple.  Skin:    General: Skin is warm and dry.     Capillary Refill: Capillary refill takes less than 2 seconds.  Neurological:     Mental Status: She is alert.  Psychiatric:        Mood and Affect: Mood normal.     ED Results / Procedures / Treatments   Labs (all labs ordered are listed, but only abnormal results are displayed) Labs Reviewed  COMPREHENSIVE METABOLIC PANEL - Abnormal; Notable for the following components:      Result Value   Glucose, Bld 102 (*)    Total Protein 8.2 (*)    Alkaline Phosphatase 170 (*)    All other components within normal limits  CBC WITH DIFFERENTIAL/PLATELET - Abnormal; Notable for the following components:   Platelets 604 (*)    All other components within normal limits  URINALYSIS, ROUTINE W REFLEX MICROSCOPIC - Abnormal; Notable for the following components:   Color, Urine AMBER (*)    APPearance HAZY (*)    Ketones, ur 5 (*)    Protein, ur 30 (*)    Bacteria, UA RARE (*)    All other components within normal limits  LIPASE, BLOOD    EKG None  Radiology CT ABDOMEN PELVIS W CONTRAST  Result Date: 06/21/2022 CLINICAL DATA:  Right upper quadrant pain EXAM: CT ABDOMEN AND PELVIS WITH CONTRAST TECHNIQUE: Multidetector CT imaging of the abdomen  and pelvis was performed using the standard protocol following bolus administration of intravenous contrast. RADIATION DOSE REDUCTION: This exam was performed according to the departmental dose-optimization program which includes automated exposure control, adjustment of the mA and/or kV according to patient size and/or use of iterative reconstruction technique. CONTRAST:  121m OMNIPAQUE IOHEXOL 300 MG/ML  SOLN COMPARISON:  CT abdomen 06/03/2022 FINDINGS: Lower chest: No acute abnormality. Hepatobiliary: Redemonstration of an evolving hypodense subcapsular hematoma at the posterior and inferior right hepatic lobe, the hematoma is slightly enlarged  since previous study measuring up to approximately 6.8 x 3.3 x 5.8 cm, given its amorphous shape. There is also tract like hypodensity extending anteriorly from the subcapsular collection within the hepatic parenchyma which also was likely present on previous study and better visualized with contrast measuring up to 3.8 cm in length and 1.1 cm in width. A few stable hepatic cysts identified measuring up to 1.2 cm near the dome of the right hepatic lobe. Area of focal fatty infiltration anteriorly adjacent to the falciform ligament which is chronic. Gallbladder appears normal. No biliary ductal dilatation. Pancreas: Unremarkable. No pancreatic ductal dilatation or surrounding inflammatory changes. Spleen: Normal in size without focal abnormality. Adrenals/Urinary Tract: Adrenal glands appears stable and within normal limits. Kidneys appear normal. Urinary bladder is incompletely distended and not well evaluated. Stomach/Bowel: Small hiatal hernia. No bowel obstruction, free air or pneumatosis. No bowel wall edema. Appendix is normal. Vascular/Lymphatic: No significant vascular findings are present. No enlarged abdominal or pelvic lymph nodes. Reproductive: Prominent heterogeneous likely fibroid uterus. No suspicious adnexal mass identified. Other: Interval decreased size  of a small amount of hypodense evolving hematoma inferior to the stomach now measuring 1.7 cm. Small amount of likely intraperitoneal blood in the pelvis. Musculoskeletal: Degenerative changes at L5-S1. No suspicious bony lesions identified. IMPRESSION: 1. Evolving hepatic subcapsular hematoma which is more hypodense than on previous study, however is mildly enlarged since previous study. Decreased and nearly resolved intraperitoneal blood products inferior to the stomach. Small amount of evolving intraperitoneal blood in the pelvis. 2. Small hiatal hernia. 3. Fibroid uterus. Electronically Signed   By: Ofilia Neas M.D.   On: 06/21/2022 15:52    Procedures Procedures    Medications Ordered in ED Medications  metoCLOPramide (REGLAN) injection 10 mg (has no administration in time range)  fentaNYL (SUBLIMAZE) injection 50 mcg (has no administration in time range)  sodium chloride 0.9 % bolus 1,000 mL (has no administration in time range)  iohexol (OMNIPAQUE) 300 MG/ML solution 100 mL (100 mLs Intravenous Contrast Given 06/21/22 1514)  pantoprazole (PROTONIX) injection 40 mg (40 mg Intravenous Given 06/21/22 1642)    ED Course/ Medical Decision Making/ A&P                           Medical Decision Making Risk Prescription drug management.   56 year old lady presents to ER due to concern for abdominal pain nausea, vomiting.  Notably recent liver biopsy complicated by subcapsular hematoma.  Extensive chart review was completed for additional history. Reviewed discharge summary from 6/17, liver biopsy complicated by subcapsular hematoma.  Managed conservatively.  Reviewed pathology report - liver core shows mild steatosis with no other diagnostic  pathologic abnormality.  On exam patient currently is remarkably well-appearing in no acute distress with grossly normal vital signs.  She denies any fever at home and has had no fever here.  Reviewed labs, her hemoglobin is now within normal limits,  13, improved from 11 at time of discharge.  She has no leukocytosis.  No electrolyte derangement, no renal dysfunction to suggest acute kidney injury.  CT scan obtained to evaluate for any complications from her recent procedure.  She has evolving subcapsular hematoma more hypodense than prior study, slightly enlarged from prior study.  Decreased, nearly resolved intra peritoneal blood inferior to stomach.  I discussed case with Dr. Deno Etienne her gastroenterologist.  She states given the findings today she would advise discharge and close outpatient follow-up in her clinic.  She  did recommend touching base with radiology to clarify the initial report.  I discussed with Dr. Jimmye Norman the radiologist.  he said the CT today does not show any evidence of new or active bleeding, no evidence for infection.  I further discussed the case with Dr. Harlow Asa on-call with general surgery, he does not see any indication for intervention from general surgery standpoint and feels patient is appropriate for conservative management at this time.  He discussed the case with Dr. Zenia Resides, general surgery hepatobiliary specialist who is taking call at [facility] today.  He states that she has reviewed the images from today and from 6/16 and she agrees there is no indication for intervention or need for general surgery to be involved at this point.  I reassessed patient and she remains well-appearing.  She expressed concern over the pain associated with eating and some recent weight loss.  For better symptom control and in person formal consultation with gastroenterology, I offered to admit patient for further management.  Patient does not want to be admitted at this time and elected to be discharged home.  She is still tolerating liquids.  I encouraged close follow-up with her gastroenterologist and primary care doctor and reviewed strict return precautions should her symptoms worsen.  Given the work-up today and my extensive discussion with  consultants, feel she is appropriate for discharge home at this time.  I have updated her brother over the phone.  After the discussed management above, the patient was determined to be safe for discharge.  The patient was in agreement with this plan and all questions regarding their care were answered.  ED return precautions were discussed and the patient will return to the ED with any significant worsening of condition.        Final Clinical Impression(s) / ED Diagnoses Final diagnoses:  Subcapsular hematoma of liver  LUQ abdominal pain    Rx / DC Orders ED Discharge Orders     None         Lucrezia Starch, MD 06/21/22 [phone]

## 2022-06-29 DIAGNOSIS — R1013 Epigastric pain: Secondary | ICD-10-CM | POA: Diagnosis not present

## 2022-06-29 DIAGNOSIS — S36112D Contusion of liver, subsequent encounter: Secondary | ICD-10-CM | POA: Diagnosis not present

## 2022-07-04 DIAGNOSIS — K2930 Chronic superficial gastritis without bleeding: Secondary | ICD-10-CM | POA: Diagnosis not present

## 2022-07-04 DIAGNOSIS — R634 Abnormal weight loss: Secondary | ICD-10-CM | POA: Diagnosis not present

## 2022-07-04 DIAGNOSIS — K3189 Other diseases of stomach and duodenum: Secondary | ICD-10-CM | POA: Diagnosis not present

## 2022-07-04 DIAGNOSIS — K317 Polyp of stomach and duodenum: Secondary | ICD-10-CM | POA: Diagnosis not present

## 2022-07-04 DIAGNOSIS — R1013 Epigastric pain: Secondary | ICD-10-CM | POA: Diagnosis not present

## 2022-07-04 DIAGNOSIS — K449 Diaphragmatic hernia without obstruction or gangrene: Secondary | ICD-10-CM | POA: Diagnosis not present

## 2022-08-03 DIAGNOSIS — J050 Acute obstructive laryngitis [croup]: Secondary | ICD-10-CM | POA: Diagnosis not present

## 2022-10-17 ENCOUNTER — Emergency Department (HOSPITAL_BASED_OUTPATIENT_CLINIC_OR_DEPARTMENT_OTHER)
Admission: EM | Admit: 2022-10-17 | Discharge: 2022-10-17 | Disposition: A | Discharge status: Home / Self Care | Payer: BC Managed Care – PPO | Attending: Emergency Medicine | Admitting: Emergency Medicine

## 2022-10-17 ENCOUNTER — Emergency Department (HOSPITAL_BASED_OUTPATIENT_CLINIC_OR_DEPARTMENT_OTHER): Payer: BC Managed Care – PPO | Admitting: Radiology

## 2022-10-17 ENCOUNTER — Other Ambulatory Visit: Payer: Self-pay

## 2022-10-17 ENCOUNTER — Encounter (HOSPITAL_BASED_OUTPATIENT_CLINIC_OR_DEPARTMENT_OTHER): Payer: Self-pay

## 2022-10-17 DIAGNOSIS — Z79899 Other long term (current) drug therapy: Secondary | ICD-10-CM | POA: Insufficient documentation

## 2022-10-17 DIAGNOSIS — R0602 Shortness of breath: Secondary | ICD-10-CM | POA: Insufficient documentation

## 2022-10-17 DIAGNOSIS — I10 Essential (primary) hypertension: Secondary | ICD-10-CM | POA: Insufficient documentation

## 2022-10-17 DIAGNOSIS — M79602 Pain in left arm: Secondary | ICD-10-CM | POA: Diagnosis not present

## 2022-10-17 DIAGNOSIS — R200 Anesthesia of skin: Secondary | ICD-10-CM | POA: Insufficient documentation

## 2022-10-17 DIAGNOSIS — Z7984 Long term (current) use of oral hypoglycemic drugs: Secondary | ICD-10-CM | POA: Diagnosis not present

## 2022-10-17 DIAGNOSIS — M542 Cervicalgia: Secondary | ICD-10-CM | POA: Insufficient documentation

## 2022-10-17 DIAGNOSIS — J90 Pleural effusion, not elsewhere classified: Secondary | ICD-10-CM | POA: Diagnosis not present

## 2022-10-17 LAB — TROPONIN I (HIGH SENSITIVITY)
Troponin I (High Sensitivity): 3 ng/L (ref <18)
Troponin I (High Sensitivity): 3 ng/L (ref <18)

## 2022-10-17 LAB — D-DIMER, QUANTITATIVE: D-Dimer, Quant: 0.36 ug/mL-FEU (ref 0.00–0.50)

## 2022-10-17 LAB — CBC
HCT: 41.7 % (ref 36.0–46.0)
Hemoglobin: 13.9 g/dL (ref 12.0–15.0)
MCH: 28.3 pg (ref 26.0–34.0)
MCHC: 33.3 g/dL (ref 30.0–36.0)
MCV: 84.8 fL (ref 80.0–100.0)
Platelets: 506 10*3/uL — ABNORMAL HIGH (ref 150–400)
RBC: 4.92 MIL/uL (ref 3.87–5.11)
RDW: 14.2 % (ref 11.5–15.5)
WBC: 9.7 10*3/uL (ref 4.0–10.5)
nRBC: 0 % (ref 0.0–0.2)

## 2022-10-17 LAB — BASIC METABOLIC PANEL
Anion gap: 7 (ref 5–15)
BUN: 15 mg/dL (ref 6–20)
CO2: 27 mmol/L (ref 22–32)
Calcium: 9.8 mg/dL (ref 8.9–10.3)
Chloride: 106 mmol/L (ref 98–111)
Creatinine, Ser: 0.82 mg/dL (ref 0.44–1.00)
GFR, Estimated: >60 mL/min (ref >60)
Glucose, Bld: 117 mg/dL — ABNORMAL HIGH (ref 70–99)
Potassium: 3.4 mmol/L — ABNORMAL LOW (ref 3.5–5.1)
Sodium: 140 mmol/L (ref 135–145)

## 2022-10-17 MED ORDER — PREDNISONE 10 MG PO TABS: 40.0000 mg | ORAL_TABLET | Freq: Every day | ORAL | 0 refills | Status: AC

## 2022-10-17 MED ORDER — ALBUTEROL SULFATE HFA 108 (90 BASE) MCG/ACT IN AERS: 2.0000 | INHALATION_SPRAY | RESPIRATORY_TRACT | Status: DC | PRN

## 2022-10-17 NOTE — ED Provider Notes (Signed)
[place] EMERGENCY DEPT Provider Note   CSN: [id number] Arrival date & time: 10/17/22  1616     History  Chief Complaint  Patient presents with   Shortness of Breath   Shoulder Pain    Haley Harding is a 56 y.o. female.  With history of hypertension, depression, anxiety, anemia presents ED for evaluation of left arm and neck pain as well as shortness of breath.  Describes the neck and arm pain as sharp and shooting.  Is also intermittent.  Happens occasionally when she moves a specific way and eventually resolves.  Reports noticing shortness of breath yesterday when she tried to go to sleep.  She was still able to sleep through the night.  States that shortness of breath is mild and present at rest.  Also reports occasional left leg swelling when she has been sitting at home all day.  States this has been persistent for some time and she has seen her primary care provider about this who reportedly told her to elevate her legs at the end of the night.  Does report a mild pain in this leg when this occurs.  Denies chest pain, cough, dizziness, lightheadedness, recent surgery, history of cancer, recent travel.  No known injury to the left upper extremity.   Shortness of Breath Associated symptoms: neck pain   Shoulder Pain Associated symptoms: neck pain        Home Medications Prior to Admission medications   Medication Sig Start Date End Date Taking? Authorizing Provider  acyclovir (ZOVIRAX) 800 MG tablet Take 1 tablet (800 mg total) by mouth 3 (three) times daily. Patient not taking: Reported on 06/03/2022 11/20/15   Janith Lima, MD  amLODipine (NORVASC) 5 MG tablet Take 1 tablet (5 mg total) by mouth daily. 11/20/15   Janith Lima, MD  buPROPion (WELLBUTRIN XL) 300 MG 24 hr tablet Take 300 mg by mouth daily.    [provider]  glipiZIDE (GLUCOTROL XL) 10 MG 24 hr tablet Take 10 mg by mouth daily with breakfast.    [provider]   ipratropium (ATROVENT) 0.06 % nasal spray Place 1 spray into both nostrils daily as needed for rhinitis. 03/25/22   [provider]  lisinopril (ZESTRIL) 5 MG tablet Take 5 mg by mouth daily.    [provider]  metoprolol tartrate (LOPRESSOR) 25 MG tablet Take 25 mg by mouth daily.    [provider]  omeprazole (PRILOSEC) 20 MG capsule Take 1 capsule (20 mg total) by mouth daily. Yearly physical  due in Sept must see md for refills Patient taking differently: Take 20 mg by mouth daily as needed (heart burn). 05/11/16   Janith Lima, MD  pravastatin (PRAVACHOL) 20 MG tablet Take 20 mg by mouth daily.    [provider]  sertraline (ZOLOFT) 100 MG tablet Take 2 tablets (200 mg total) by mouth daily. 06/04/22   Geradine Girt, DO  traMADol (ULTRAM) 50 MG tablet Take 1 tablet (50 mg total) by mouth every 6 (six) hours as needed for severe pain. 06/04/22   Geradine Girt, DO  tretinoin (RETIN-A) 0.05 % cream Apply 1 Application topically daily as needed (bre ak out). 03/20/22   [provider]  valACYclovir (VALTREX) 500 MG tablet Take 500 mg by mouth daily as needed (breakout).    [provider]      Allergies    Duloxetine    Review of Systems   Review of Systems  Respiratory:  Positive for shortness of breath.   Musculoskeletal:  Positive for myalgias and neck pain.  All other systems reviewed and are negative.   Physical Exam Updated Vital Signs BP 135/89   Pulse 87   Temp 97.9 F (36.6 C) (Oral)   Resp (!) 21   Ht '5\' 7"'$  (1.702 m)   Wt 91.2 kg   SpO2 98%   BMI 31.48 kg/m  Physical Exam Vitals and nursing note reviewed.  Constitutional:      General: She is not in acute distress.    Appearance: She is well-developed. She is not ill-appearing, toxic-appearing or diaphoretic.  HENT:     Head: Normocephalic and atraumatic.  Eyes:     Conjunctiva/sclera: Conjunctivae normal.  Cardiovascular:     Rate and Rhythm: Normal  rate and regular rhythm.     Pulses: Normal pulses.     Heart sounds: Normal heart sounds. No murmur heard. Pulmonary:     Effort: Pulmonary effort is normal. No respiratory distress.     Breath sounds: Normal breath sounds. No decreased breath sounds, wheezing, rhonchi or rales.  Abdominal:     Palpations: Abdomen is soft.     Tenderness: There is no abdominal tenderness.  Musculoskeletal:        General: No swelling.     Cervical back: Normal range of motion and neck supple.  Skin:    General: Skin is warm and dry.     Capillary Refill: Capillary refill takes less than 2 seconds.  Neurological:     General: No focal deficit present.     Mental Status: She is alert and oriented to person, place, and time.     Comments: Sensation intact in all extremities.  Strength 5 out of 5 in all extremities  Psychiatric:        Mood and Affect: Mood normal.     ED Results / Procedures / Treatments   Labs (all labs ordered are listed, but only abnormal results are displayed) Labs Reviewed  BASIC METABOLIC PANEL - Abnormal; Notable for the following components:      Result Value   Potassium 3.4 (*)    Glucose, Bld 117 (*)    All other components within normal limits  CBC - Abnormal; Notable for the following components:   Platelets 506 (*)    All other components within normal limits  D-DIMER, QUANTITATIVE  TROPONIN I (HIGH SENSITIVITY)  TROPONIN I (HIGH SENSITIVITY)    EKG None  Radiology DG Chest 2 View  Result Date: 10/17/2022 CLINICAL DATA:  Shortness of breath. EXAM: CHEST - 2 VIEW COMPARISON:  06/11/21 FINDINGS: Pleural effusion. No pneumothorax. Focal airspace opacity. Normal cardiac and mediastinal contours. Vertebral body heights are maintained. No displaced rib fractures. IMPRESSION: No active cardiopulmonary disease. Electronically Signed   By: Marin Roberts M.D.   On: 10/17/2022 17:21    Procedures Procedures    Medications Ordered in ED Medications - No data to  display  ED Course/ Medical Decision Making/ A&P                           Medical Decision Making Amount and/or Complexity of Data Reviewed Labs: ordered. Radiology: ordered.  This patient presents to the ED for concern of left arm pain and shortness of breath, this involves an extensive number of treatment options, and is a complaint that carries with it a high risk of complications and morbidity.  The differential diagnosis  includes cervical radiculopathy, muscle strain, tendon or ligament sprain  The emergent differential diagnosis for shortness of breath includes, but is not limited to, Pulmonary edema, bronchoconstriction, Pneumonia, Pulmonary embolism, Pneumotherax/ Hemothorax, Dysrythmia, ACS.     Co morbidities that complicate the patient evaluation  Depression, anxiety, anemia, hypertension  My initial workup includes ACS rule out, D-dimer to rule out PE  Additional history obtained from: Nursing notes from this visit.  I ordered, reviewed and interpreted labs which include: Troponin, delta troponin, BNP, CBC, D-dimer.  All labs within normal limits.   I ordered imaging studies including x-ray and some nausea chest I independently visualized and interpreted imaging which showed normal I agree with the radiologist interpretation  Cardiac Monitoring:  The patient was maintained on a cardiac monitor.  I personally viewed and interpreted the cardiac monitored which showed an underlying rhythm of: NSR  Afebrile, hemodynamically stable.  Patient is a 56 year old female who presents ED for evaluation of 3 days of intermittent left arm pain that she describes as sharp and shooting as well as left hand numbness.  Shared decision making conversation was had with patient regarding further work-up including imaging versus p.o. steroid trial and patient chose to trial p.o. steroids and follow-up with her primary care provider if this does not work.  Patient's symptoms correlate well  with cervical radiculopathy and I believe a steroid trial is appropriate.  Patient also reports mild shortness of breath, however lung sounds are normal, chest x-ray is normal, D-dimer is negative, and patient overall looks very well.  Overall I have low suspicion for acute cardiopulmonary emergencies causing her shortness of breath.  Gave patient strict return precautions.  Stable at discharge.  At this time there does not appear to be any evidence of an acute emergency medical condition and the patient appears stable for discharge with appropriate outpatient follow up. Diagnosis was discussed with patient who verbalizes understanding of care plan and is agreeable to discharge. I have discussed return precautions with patient who verbalizes understanding. Patient encouraged to follow-up with their PCP within 1 week. All questions answered.  Patient's case discussed with Dr. Armandina Gemma who agrees with plan to discharge with follow-up.   Note: Portions of this report may have been transcribed using voice recognition software. Every effort was made to ensure accuracy; however, inadvertent computerized transcription errors may still be present.          Final Clinical Impression(s) / ED Diagnoses Final diagnoses:  None    Rx / DC Orders ED Discharge Orders     None         Roylene Reason, Hershal Coria 10/17/22 2309    Regan Lemming, MD 10/18/22 [phone]

## 2022-10-17 NOTE — Discharge Instructions (Addendum)
You have been seen today for your complaint of left arm pain and shortness of breath. Your lab work was reassuring and showed no abnormalities. Your imaging was reassuring and showed no abnormalities. Your discharge medications include prednisone.  This is a steroid.  You should take as prescribed and for the entire duration of the prescription.  You should take it with breakfast as this may cause dizziness or insomnia.. Follow up with: Your primary care provider in 1 week Please seek immediate medical care if you develop any of the following symptoms: Your shortness of breath gets worse. You have trouble breathing when you are resting. You feel light-headed or you faint. You have a cough that is not helped by medicines. You cough up blood. You have pain with breathing. You have pain in your chest, arms, shoulders, or belly (abdomen). You have a fever. At this time there does not appear to be the presence of an emergent medical condition, however there is always the potential for conditions to change. Please read and follow the below instructions.  Do not take your medicine if  develop an itchy rash, swelling in your mouth or lips, or difficulty breathing; call 911 and seek immediate emergency medical attention if this occurs.  You may review your lab tests and imaging results in their entirety on your MyChart account.  Please discuss all results of fully with your primary care provider and other specialist at your follow-up visit.  Note: Portions of this text may have been transcribed using voice recognition software. Every effort was made to ensure accuracy; however, inadvertent computerized transcription errors may still be present.

## 2022-10-17 NOTE — ED Triage Notes (Signed)
Pt states that she has had L sided neck, shoulder, and side pain since Friday. Pt states that pain has increased and that she is now [facility] with exertion. Pt denies injury.

## 2022-10-17 NOTE — ED Notes (Signed)
Pt verbalizes understanding of discharge instructions. Opportunity for questioning and answers were provided. Pt discharged from ED to home with family.    

## 2022-10-24 DIAGNOSIS — Z6830 Body mass index (BMI) 30.0-30.9, adult: Secondary | ICD-10-CM | POA: Diagnosis not present

## 2022-10-24 DIAGNOSIS — I10 Essential (primary) hypertension: Secondary | ICD-10-CM | POA: Diagnosis not present

## 2022-10-24 DIAGNOSIS — E6609 Other obesity due to excess calories: Secondary | ICD-10-CM | POA: Diagnosis not present

## 2022-10-24 DIAGNOSIS — E1169 Type 2 diabetes mellitus with other specified complication: Secondary | ICD-10-CM | POA: Diagnosis not present

## 2022-10-25 DIAGNOSIS — D250 Submucous leiomyoma of uterus: Secondary | ICD-10-CM | POA: Diagnosis not present

## 2022-10-25 DIAGNOSIS — N951 Menopausal and female climacteric states: Secondary | ICD-10-CM | POA: Diagnosis not present

## 2022-10-25 DIAGNOSIS — D251 Intramural leiomyoma of uterus: Secondary | ICD-10-CM | POA: Diagnosis not present

## 2022-10-25 DIAGNOSIS — Z01411 Encounter for gynecological examination (general) (routine) with abnormal findings: Secondary | ICD-10-CM | POA: Diagnosis not present

## 2022-11-04 DIAGNOSIS — Z0000 Encounter for general adult medical examination without abnormal findings: Secondary | ICD-10-CM | POA: Diagnosis not present

## 2022-11-04 DIAGNOSIS — Z23 Encounter for immunization: Secondary | ICD-10-CM | POA: Diagnosis not present

## 2022-11-04 DIAGNOSIS — E7800 Pure hypercholesterolemia, unspecified: Secondary | ICD-10-CM | POA: Diagnosis not present

## 2022-11-04 DIAGNOSIS — E1169 Type 2 diabetes mellitus with other specified complication: Secondary | ICD-10-CM | POA: Diagnosis not present

## 2022-11-04 DIAGNOSIS — E559 Vitamin D deficiency, unspecified: Secondary | ICD-10-CM | POA: Diagnosis not present

## 2022-11-04 DIAGNOSIS — D509 Iron deficiency anemia, unspecified: Secondary | ICD-10-CM | POA: Diagnosis not present

## 2023-03-15 IMAGING — CT CT ABDOMEN W/O CM
2 of 4 series · 16 of 46 positions shown, 18 images · non-contrast
Comparison: Imaging during ultrasound-guided biopsy of the liver
earlier today.

CLINICAL DATA: Significant pain after random biopsy of the liver
earlier today.

EXAM:
CT ABDOMEN WITHOUT CONTRAST
TECHNIQUE: Multidetector CT imaging of the abdomen was performed following the
standard protocol without IV contrast.
RADIATION DOSE REDUCTION: This exam was performed according to the
departmental dose-optimization program which includes automated
exposure control, adjustment of the mA and/or kV according to
patient size and/or use of iterative reconstruction technique.

[Series 2: axial st · axial · 0.82mm/px · z∈[-294,-34]mm · 13 of 61 slices shown, 15 images]
[im 5/61  soft-tissue]
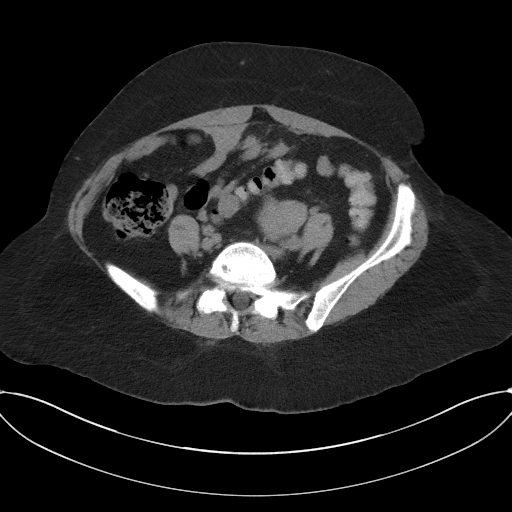
[im 5/61  bone]
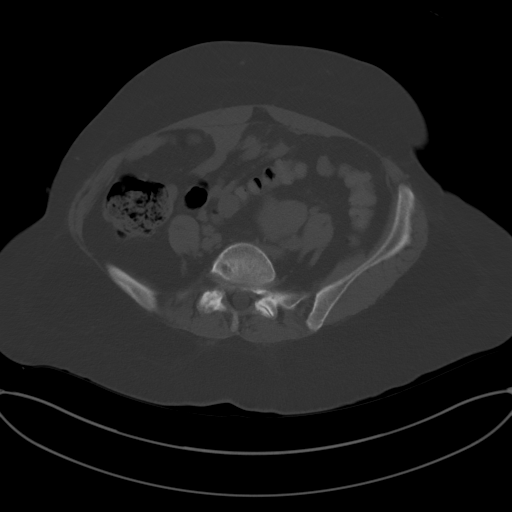
[im 9/61  soft-tissue]
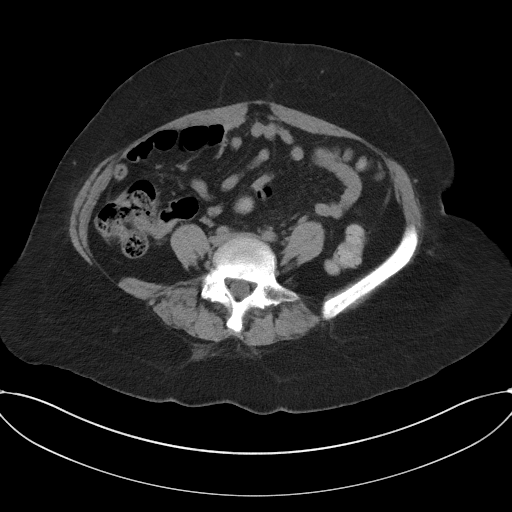
[im 13/61  soft-tissue]
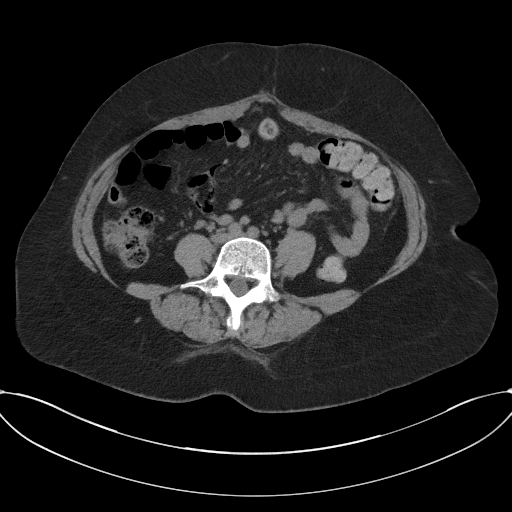
[im 17/61  soft-tissue]
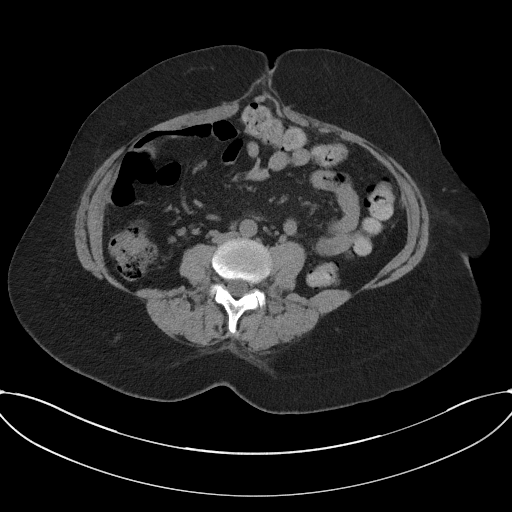
[im 21/61  soft-tissue]
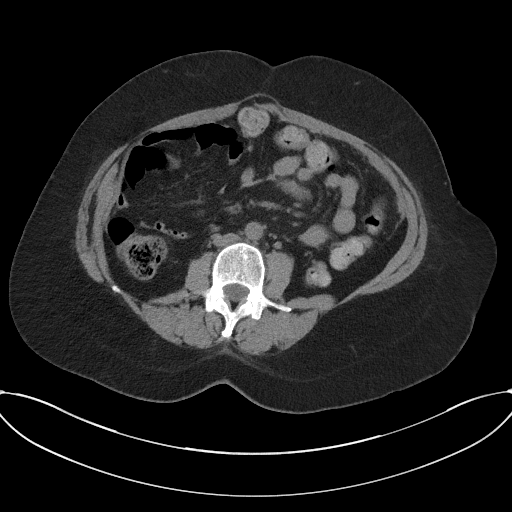
[im 25/61  soft-tissue]
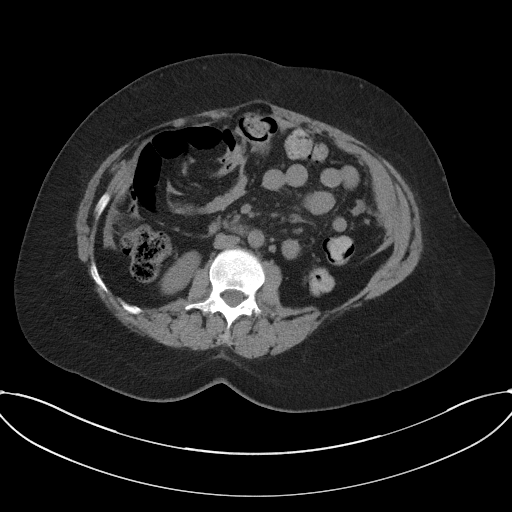
[im 33/61  soft-tissue]
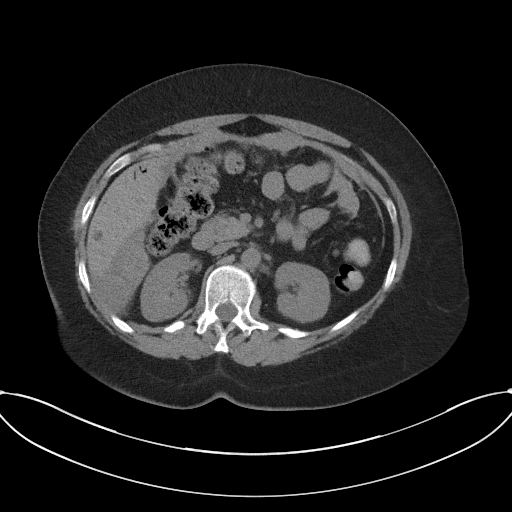
[im 37/61  soft-tissue]
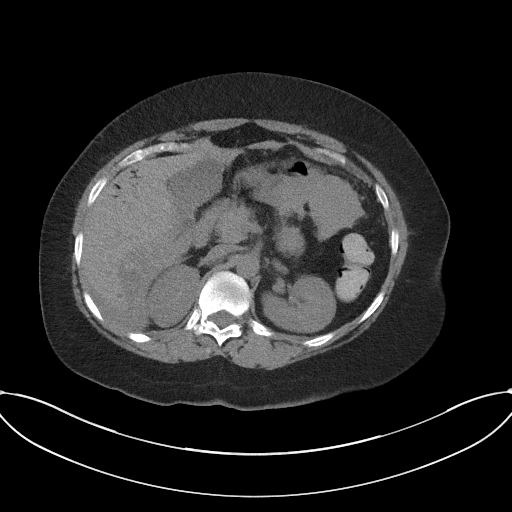
[im 41/61  soft-tissue]
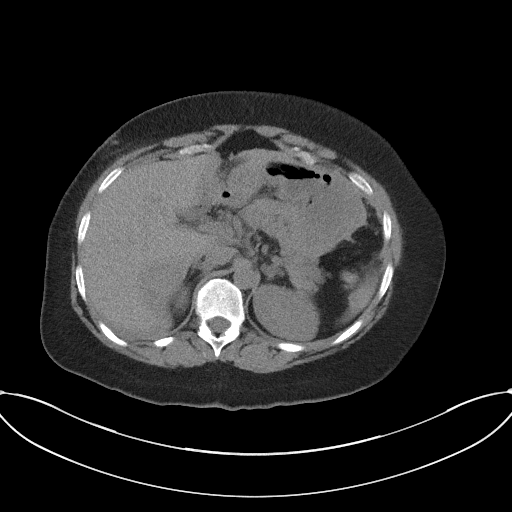
[im 41/61  bone]
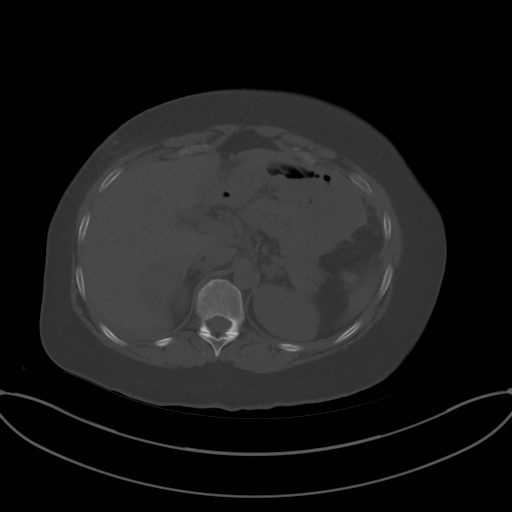
[im 45/61  soft-tissue]
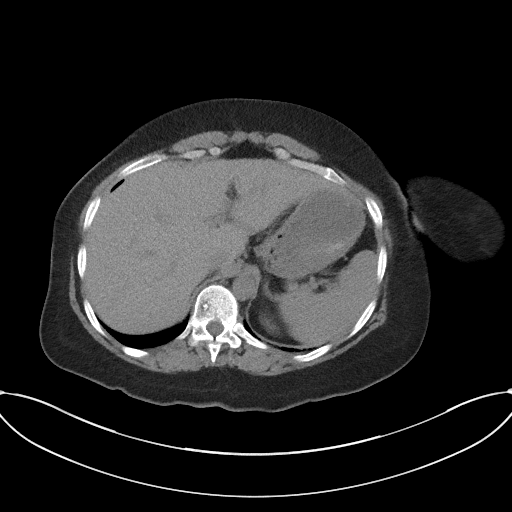
[im 49/61  soft-tissue]
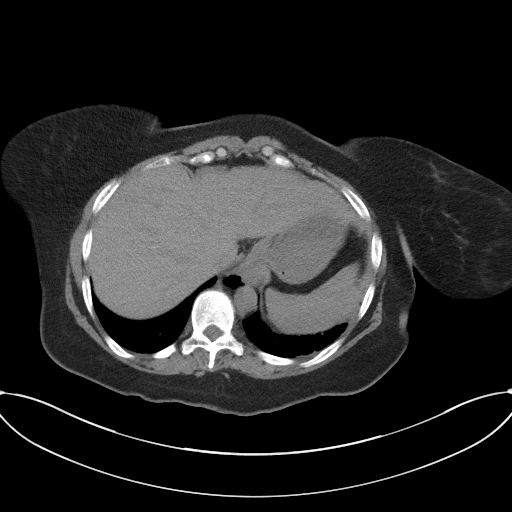
[im 53/61  soft-tissue]
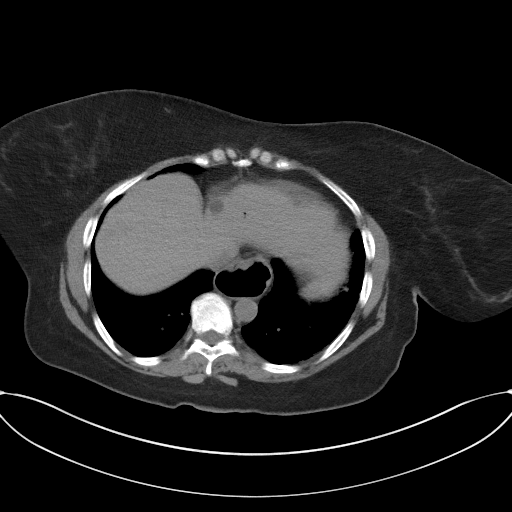
[im 57/61  soft-tissue]
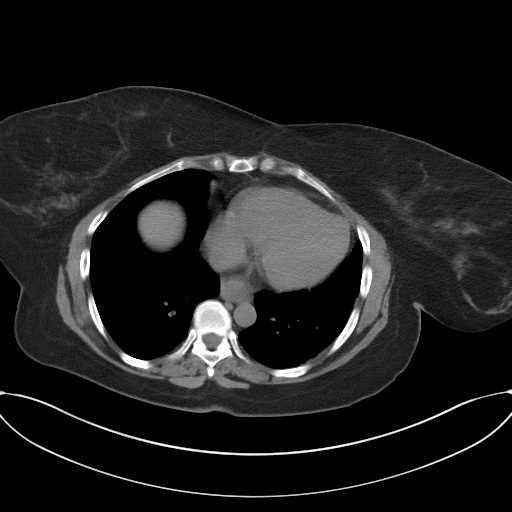

[Series 5: coronal st · coronal · 0.62mm/px · 3 of 96 slices shown]
[im 32/96  soft-tissue]
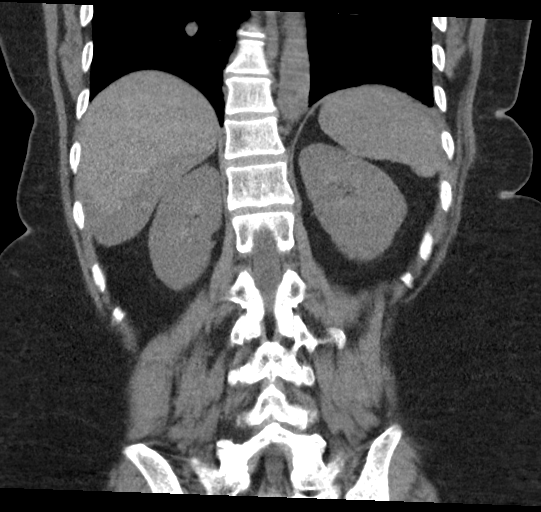
[im 43/96  soft-tissue]
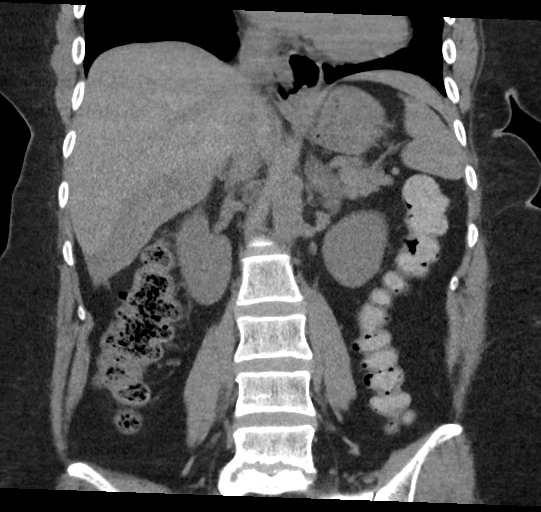
[im 53/96  soft-tissue]
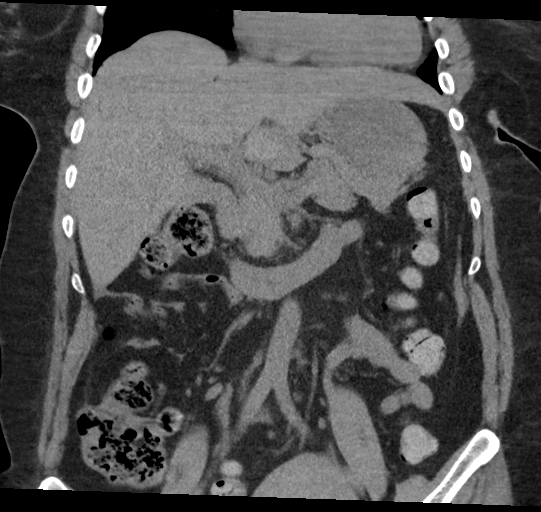

[16 of 46 positions shown; findings below may reference images not displayed]

FINDINGS: Lower chest: No acute abnormality.  Moderate-sized hiatal hernia.

Hepatobiliary: Subcapsular hemorrhage present around the inferior
aspect of the right lobe of the liver following biopsy. There also
is a small amount of pneumobilia. Maximal thickness of the
subcapsular hemorrhage posteriorly is approximately 2.8 cm. A small
amount of hemorrhage tracks anteriorly and abuts the stomach.

Pancreas: Unremarkable. No pancreatic ductal dilatation or
surrounding inflammatory changes.

Spleen: Normal in size without focal abnormality.

Adrenals/Urinary Tract: Adrenal glands are unremarkable. Kidneys are
normal, without renal calculi, focal lesion, or hydronephrosis.

Stomach/Bowel: Bowel shows no evidence of obstruction, ileus,
inflammation or lesion. No free intraperitoneal air.

Vascular/Lymphatic: No significant vascular findings are present. No
enlarged lymph nodes.

Other: No abdominal wall hernia or abnormality.

Musculoskeletal: No acute or significant osseous findings.
IMPRESSION: Acute subcapsular hemorrhage adjacent to the posterior and inferior
right lobe of the liver following liver biopsy. There is small
component of intraperitoneal blood anteriorly that abuts the stomach
also.

## 2023-05-08 DIAGNOSIS — E1169 Type 2 diabetes mellitus with other specified complication: Secondary | ICD-10-CM | POA: Diagnosis not present

## 2023-05-08 DIAGNOSIS — I10 Essential (primary) hypertension: Secondary | ICD-10-CM | POA: Diagnosis not present

## 2023-05-08 DIAGNOSIS — F324 Major depressive disorder, single episode, in partial remission: Secondary | ICD-10-CM | POA: Diagnosis not present

## 2023-05-08 DIAGNOSIS — E7800 Pure hypercholesterolemia, unspecified: Secondary | ICD-10-CM | POA: Diagnosis not present

## 2023-05-11 DIAGNOSIS — N950 Postmenopausal bleeding: Secondary | ICD-10-CM | POA: Diagnosis not present

## 2023-05-11 DIAGNOSIS — D251 Intramural leiomyoma of uterus: Secondary | ICD-10-CM | POA: Diagnosis not present

## 2023-06-07 DIAGNOSIS — D250 Submucous leiomyoma of uterus: Secondary | ICD-10-CM | POA: Diagnosis not present

## 2023-06-07 DIAGNOSIS — N852 Hypertrophy of uterus: Secondary | ICD-10-CM | POA: Diagnosis not present

## 2023-06-07 DIAGNOSIS — D252 Subserosal leiomyoma of uterus: Secondary | ICD-10-CM | POA: Diagnosis not present

## 2023-06-07 DIAGNOSIS — D251 Intramural leiomyoma of uterus: Secondary | ICD-10-CM | POA: Diagnosis not present

## 2023-06-07 DIAGNOSIS — N939 Abnormal uterine and vaginal bleeding, unspecified: Secondary | ICD-10-CM | POA: Diagnosis not present

## 2023-06-07 DIAGNOSIS — D259 Leiomyoma of uterus, unspecified: Secondary | ICD-10-CM | POA: Diagnosis not present

## 2023-06-09 DIAGNOSIS — Z1231 Encounter for screening mammogram for malignant neoplasm of breast: Secondary | ICD-10-CM | POA: Diagnosis not present

## 2023-06-09 DIAGNOSIS — Z1239 Encounter for other screening for malignant neoplasm of breast: Secondary | ICD-10-CM | POA: Diagnosis not present

## 2023-06-18 DIAGNOSIS — N939 Abnormal uterine and vaginal bleeding, unspecified: Secondary | ICD-10-CM | POA: Diagnosis not present

## 2023-06-29 DIAGNOSIS — D250 Submucous leiomyoma of uterus: Secondary | ICD-10-CM | POA: Diagnosis not present

## 2023-06-29 DIAGNOSIS — N939 Abnormal uterine and vaginal bleeding, unspecified: Secondary | ICD-10-CM | POA: Diagnosis not present

## 2023-06-29 DIAGNOSIS — N852 Hypertrophy of uterus: Secondary | ICD-10-CM | POA: Diagnosis not present

## 2023-10-13 DIAGNOSIS — R748 Abnormal levels of other serum enzymes: Secondary | ICD-10-CM | POA: Diagnosis not present

## 2023-11-07 DIAGNOSIS — R87610 Atypical squamous cells of undetermined significance on cytologic smear of cervix (ASC-US): Secondary | ICD-10-CM | POA: Diagnosis not present

## 2023-11-07 DIAGNOSIS — N939 Abnormal uterine and vaginal bleeding, unspecified: Secondary | ICD-10-CM | POA: Diagnosis not present

## 2023-11-07 DIAGNOSIS — Z124 Encounter for screening for malignant neoplasm of cervix: Secondary | ICD-10-CM | POA: Diagnosis not present

## 2023-11-07 DIAGNOSIS — D259 Leiomyoma of uterus, unspecified: Secondary | ICD-10-CM | POA: Diagnosis not present

## 2023-11-07 DIAGNOSIS — N841 Polyp of cervix uteri: Secondary | ICD-10-CM | POA: Diagnosis not present

## 2023-11-07 DIAGNOSIS — Z01411 Encounter for gynecological examination (general) (routine) with abnormal findings: Secondary | ICD-10-CM | POA: Diagnosis not present

## 2023-11-13 DIAGNOSIS — E119 Type 2 diabetes mellitus without complications: Secondary | ICD-10-CM | POA: Diagnosis not present

## 2023-11-13 DIAGNOSIS — D509 Iron deficiency anemia, unspecified: Secondary | ICD-10-CM | POA: Diagnosis not present

## 2023-11-13 DIAGNOSIS — I10 Essential (primary) hypertension: Secondary | ICD-10-CM | POA: Diagnosis not present

## 2023-11-13 DIAGNOSIS — Z0000 Encounter for general adult medical examination without abnormal findings: Secondary | ICD-10-CM | POA: Diagnosis not present

## 2023-11-13 DIAGNOSIS — F324 Major depressive disorder, single episode, in partial remission: Secondary | ICD-10-CM | POA: Diagnosis not present

## 2023-11-13 DIAGNOSIS — E559 Vitamin D deficiency, unspecified: Secondary | ICD-10-CM | POA: Diagnosis not present

## 2023-11-13 DIAGNOSIS — E7800 Pure hypercholesterolemia, unspecified: Secondary | ICD-10-CM | POA: Diagnosis not present

## 2023-11-30 DIAGNOSIS — K219 Gastro-esophageal reflux disease without esophagitis: Secondary | ICD-10-CM | POA: Diagnosis not present

## 2023-11-30 DIAGNOSIS — R109 Unspecified abdominal pain: Secondary | ICD-10-CM | POA: Diagnosis not present

## 2023-11-30 DIAGNOSIS — R110 Nausea: Secondary | ICD-10-CM | POA: Diagnosis not present

## 2023-12-21 DIAGNOSIS — N841 Polyp of cervix uteri: Secondary | ICD-10-CM | POA: Diagnosis not present

## 2023-12-27 ENCOUNTER — Observation Stay (HOSPITAL_COMMUNITY)
Admission: EM | Admit: 2023-12-27 | Discharge: 2023-12-31 | Disposition: A | Discharge status: Home / Self Care | Payer: BC Managed Care – PPO | Attending: Internal Medicine | Admitting: Internal Medicine

## 2023-12-27 ENCOUNTER — Other Ambulatory Visit: Payer: Self-pay

## 2023-12-27 ENCOUNTER — Encounter (HOSPITAL_COMMUNITY): Payer: Self-pay

## 2023-12-27 DIAGNOSIS — K529 Noninfective gastroenteritis and colitis, unspecified: Secondary | ICD-10-CM | POA: Diagnosis not present

## 2023-12-27 DIAGNOSIS — F32A Depression, unspecified: Secondary | ICD-10-CM | POA: Diagnosis not present

## 2023-12-27 DIAGNOSIS — Z79899 Other long term (current) drug therapy: Secondary | ICD-10-CM | POA: Diagnosis not present

## 2023-12-27 DIAGNOSIS — R1011 Right upper quadrant pain: Secondary | ICD-10-CM | POA: Diagnosis not present

## 2023-12-27 DIAGNOSIS — K449 Diaphragmatic hernia without obstruction or gangrene: Secondary | ICD-10-CM | POA: Diagnosis not present

## 2023-12-27 DIAGNOSIS — R112 Nausea with vomiting, unspecified: Secondary | ICD-10-CM

## 2023-12-27 DIAGNOSIS — R1013 Epigastric pain: Principal | ICD-10-CM | POA: Insufficient documentation

## 2023-12-27 DIAGNOSIS — R10811 Right upper quadrant abdominal tenderness: Secondary | ICD-10-CM | POA: Insufficient documentation

## 2023-12-27 DIAGNOSIS — E785 Hyperlipidemia, unspecified: Secondary | ICD-10-CM | POA: Diagnosis not present

## 2023-12-27 DIAGNOSIS — D500 Iron deficiency anemia secondary to blood loss (chronic): Secondary | ICD-10-CM | POA: Diagnosis not present

## 2023-12-27 DIAGNOSIS — I10 Essential (primary) hypertension: Secondary | ICD-10-CM | POA: Insufficient documentation

## 2023-12-27 DIAGNOSIS — E11649 Type 2 diabetes mellitus with hypoglycemia without coma: Secondary | ICD-10-CM | POA: Diagnosis not present

## 2023-12-27 DIAGNOSIS — R109 Unspecified abdominal pain: Secondary | ICD-10-CM | POA: Diagnosis not present

## 2023-12-27 DIAGNOSIS — E663 Overweight: Secondary | ICD-10-CM | POA: Insufficient documentation

## 2023-12-27 DIAGNOSIS — F419 Anxiety disorder, unspecified: Secondary | ICD-10-CM | POA: Insufficient documentation

## 2023-12-27 DIAGNOSIS — R160 Hepatomegaly, not elsewhere classified: Secondary | ICD-10-CM | POA: Diagnosis not present

## 2023-12-27 DIAGNOSIS — K760 Fatty (change of) liver, not elsewhere classified: Secondary | ICD-10-CM | POA: Diagnosis not present

## 2023-12-27 DIAGNOSIS — E871 Hypo-osmolality and hyponatremia: Secondary | ICD-10-CM | POA: Insufficient documentation

## 2023-12-27 LAB — COMPREHENSIVE METABOLIC PANEL
ALT: 24 U/L (ref 0–44)
AST: 22 U/L (ref 15–41)
Albumin: 4.4 g/dL (ref 3.5–5.0)
Alkaline Phosphatase: 140 U/L — ABNORMAL HIGH (ref 38–126)
Anion gap: 12 (ref 5–15)
BUN: 18 mg/dL (ref 6–20)
CO2: 22 mmol/L (ref 22–32)
Calcium: 9.7 mg/dL (ref 8.9–10.3)
Chloride: 105 mmol/L (ref 98–111)
Creatinine, Ser: 0.6 mg/dL (ref 0.44–1.00)
GFR, Estimated: >60 mL/min (ref >60)
Glucose, Bld: 77 mg/dL (ref 70–99)
Potassium: 4.2 mmol/L (ref 3.5–5.1)
Sodium: 139 mmol/L (ref 135–145)
Total Bilirubin: 0.8 mg/dL (ref 0.0–1.2)
Total Protein: 7.7 g/dL (ref 6.5–8.1)

## 2023-12-27 LAB — CBC
HCT: 46.2 % — ABNORMAL HIGH (ref 36.0–46.0)
Hemoglobin: 15.3 g/dL — ABNORMAL HIGH (ref 12.0–15.0)
MCH: 29.5 pg (ref 26.0–34.0)
MCHC: 33.1 g/dL (ref 30.0–36.0)
MCV: 89 fL (ref 80.0–100.0)
Platelets: 364 10*3/uL (ref 150–400)
RBC: 5.19 MIL/uL — ABNORMAL HIGH (ref 3.87–5.11)
RDW: 12.7 % (ref 11.5–15.5)
WBC: 7.9 10*3/uL (ref 4.0–10.5)
nRBC: 0 % (ref 0.0–0.2)

## 2023-12-27 LAB — HCG, SERUM, QUALITATIVE: Preg, Serum: NEGATIVE

## 2023-12-27 LAB — LIPASE, BLOOD: Lipase: 22 U/L (ref 11–51)

## 2023-12-27 NOTE — ED Triage Notes (Signed)
 Upper abdominal pain that started yesterday, last BM today. Pt reports nausea, 6 episodes of vomiting today. Pt was unable to hold medications down today

## 2023-12-28 ENCOUNTER — Emergency Department (HOSPITAL_COMMUNITY): Payer: BC Managed Care – PPO

## 2023-12-28 DIAGNOSIS — K449 Diaphragmatic hernia without obstruction or gangrene: Secondary | ICD-10-CM | POA: Diagnosis not present

## 2023-12-28 DIAGNOSIS — R109 Unspecified abdominal pain: Secondary | ICD-10-CM | POA: Diagnosis not present

## 2023-12-28 DIAGNOSIS — R160 Hepatomegaly, not elsewhere classified: Secondary | ICD-10-CM | POA: Diagnosis not present

## 2023-12-28 DIAGNOSIS — K529 Noninfective gastroenteritis and colitis, unspecified: Secondary | ICD-10-CM | POA: Diagnosis present

## 2023-12-28 DIAGNOSIS — K760 Fatty (change of) liver, not elsewhere classified: Secondary | ICD-10-CM | POA: Diagnosis not present

## 2023-12-28 DIAGNOSIS — R1011 Right upper quadrant pain: Secondary | ICD-10-CM | POA: Diagnosis not present

## 2023-12-28 LAB — HEMOGLOBIN A1C
Hgb A1c MFr Bld: 5.9 % — ABNORMAL HIGH (ref 4.8–5.6)
Mean Plasma Glucose: 122.63 mg/dL

## 2023-12-28 LAB — GLUCOSE, CAPILLARY
Glucose-Capillary: 171 mg/dL — ABNORMAL HIGH (ref 70–99)
Glucose-Capillary: 50 mg/dL — ABNORMAL LOW (ref 70–99)
Glucose-Capillary: 51 mg/dL — ABNORMAL LOW (ref 70–99)
Glucose-Capillary: 52 mg/dL — ABNORMAL LOW (ref 70–99)
Glucose-Capillary: 145 mg/dL — ABNORMAL HIGH (ref 70–99)

## 2023-12-28 LAB — HIV ANTIBODY (ROUTINE TESTING W REFLEX): HIV Screen 4th Generation wRfx: NONREACTIVE

## 2023-12-28 MED ORDER — ONDANSETRON HCL 4 MG PO TABS: 4.0000 mg | ORAL_TABLET | Freq: Four times a day (QID) | ORAL | 0 refills | Status: DC

## 2023-12-28 MED ORDER — INSULIN ASPART 100 UNIT/ML IJ SOLN
0.0000 [IU] | Freq: Every day | INTRAMUSCULAR | Status: DC
  Filled 2023-12-28: qty 0.05

## 2023-12-28 MED ORDER — ONDANSETRON HCL 4 MG/2ML IJ SOLN
4.0000 mg | Freq: Once | INTRAMUSCULAR | Status: AC
  Administered 2023-12-28: 4 mg via INTRAVENOUS
  Filled 2023-12-28: qty 2

## 2023-12-28 MED ORDER — HYDROCODONE-ACETAMINOPHEN 5-325 MG PO TABS
1.0000 | ORAL_TABLET | Freq: Once | ORAL | Status: DC
  Filled 2023-12-28: qty 1

## 2023-12-28 MED ORDER — TRAZODONE HCL 50 MG PO TABS: 25.0000 mg | ORAL_TABLET | Freq: Every evening | ORAL | Status: DC | PRN

## 2023-12-28 MED ORDER — ACETAMINOPHEN 650 MG RE SUPP
650.0000 mg | Freq: Four times a day (QID) | RECTAL | Status: DC | PRN
  Administered 2023-12-29: 650 mg via RECTAL
  Filled 2023-12-28: qty 1

## 2023-12-28 MED ORDER — ACETAMINOPHEN 325 MG PO TABS: 650.0000 mg | ORAL_TABLET | Freq: Four times a day (QID) | ORAL | Status: DC | PRN

## 2023-12-28 MED ORDER — MORPHINE SULFATE (PF) 4 MG/ML IV SOLN
4.0000 mg | Freq: Once | INTRAVENOUS | Status: AC
  Administered 2023-12-28: 4 mg via INTRAVENOUS
  Filled 2023-12-28: qty 1

## 2023-12-28 MED ORDER — DEXTROSE 50 % IV SOLN
25.0000 g | INTRAVENOUS | Status: AC
  Administered 2023-12-28: 25 g via INTRAVENOUS
  Filled 2023-12-28: qty 50

## 2023-12-28 MED ORDER — ALUM & MAG HYDROXIDE-SIMETH 200-200-20 MG/5ML PO SUSP
30.0000 mL | Freq: Once | ORAL | Status: AC
  Administered 2023-12-28: 30 mL via ORAL
  Filled 2023-12-28: qty 30

## 2023-12-28 MED ORDER — HYDROMORPHONE HCL 1 MG/ML IJ SOLN
0.5000 mg | INTRAMUSCULAR | Status: DC | PRN
  Administered 2023-12-28: 0.5 mg via INTRAVENOUS
  Filled 2023-12-28: qty 0.5

## 2023-12-28 MED ORDER — ENOXAPARIN SODIUM 40 MG/0.4ML IJ SOSY
40.0000 mg | PREFILLED_SYRINGE | INTRAMUSCULAR | Status: DC
  Administered 2023-12-28 – 2023-12-29 (×2): 40 mg via SUBCUTANEOUS
  Filled 2023-12-28 (×3): qty 0.4

## 2023-12-28 MED ORDER — PANTOPRAZOLE SODIUM 40 MG IV SOLR
40.0000 mg | Freq: Every day | INTRAVENOUS | Status: DC
  Administered 2023-12-28 – 2023-12-29 (×2): 40 mg via INTRAVENOUS
  Filled 2023-12-28 (×2): qty 10

## 2023-12-28 MED ORDER — DICYCLOMINE HCL 10 MG/5ML PO SOLN
10.0000 mg | Freq: Once | ORAL | Status: DC
  Filled 2023-12-28: qty 5

## 2023-12-28 MED ORDER — INSULIN ASPART 100 UNIT/ML IJ SOLN
0.0000 [IU] | Freq: Three times a day (TID) | INTRAMUSCULAR | Status: DC
  Filled 2023-12-28: qty 0.15

## 2023-12-28 MED ORDER — MORPHINE SULFATE (PF) 2 MG/ML IV SOLN: 2.0000 mg | INTRAVENOUS | Status: DC | PRN

## 2023-12-28 MED ORDER — METOCLOPRAMIDE HCL 5 MG/ML IJ SOLN
10.0000 mg | Freq: Four times a day (QID) | INTRAMUSCULAR | Status: DC | PRN
  Administered 2023-12-28 – 2023-12-31 (×2): 10 mg via INTRAVENOUS
  Filled 2023-12-28 (×2): qty 2

## 2023-12-28 MED ORDER — SODIUM CHLORIDE 0.9 % IV BOLUS
1000.0000 mL | Freq: Once | INTRAVENOUS | Status: AC
  Administered 2023-12-28: 1000 mL via INTRAVENOUS

## 2023-12-28 MED ORDER — IOHEXOL 300 MG/ML  SOLN
100.0000 mL | Freq: Once | INTRAMUSCULAR | Status: AC | PRN
  Administered 2023-12-28: 100 mL via INTRAVENOUS

## 2023-12-28 MED ORDER — DEXTROSE 50 % IV SOLN
25.0000 g | INTRAVENOUS | Status: AC
  Administered 2023-12-28: 25 g via INTRAVENOUS

## 2023-12-28 MED ORDER — DEXTROSE 50 % IV SOLN
INTRAVENOUS | Status: AC
  Filled 2023-12-28: qty 50

## 2023-12-28 MED ORDER — FAMOTIDINE 20 MG PO TABS
20.0000 mg | ORAL_TABLET | Freq: Once | ORAL | Status: AC
  Administered 2023-12-28: 20 mg via ORAL
  Filled 2023-12-28: qty 1

## 2023-12-28 NOTE — ED Provider Notes (Addendum)
 Accepted handoff at shift change from [facility], PA-C. Please see prior provider note for more detail.   Briefly: Patient is 58 y.o. presenting for abdominal pain with nausea and vomiting.  DDX: concern for cholecystitis, cholelithiasis, biliary colic, gastritis, others   Plan: Follow-up on CT abdomen pelvis and reassess.  If remaining workup unremarkable can discharge with PCP follow-up.   Physical Exam  BP (!) 128/97 (BP Location: Right Arm)   Pulse 76   Temp (!) 97.4 F (36.3 C) (Oral)   Resp 15   Ht 5' 7 (1.702 m)   Wt 80.7 kg   SpO2 100%   BMI 27.88 kg/m   Physical Exam  Procedures  Procedures  ED Course / MDM   Clinical Course as of 12/28/23 0846  Thu Dec 28, 2023  0633 Abdominal pain with n/v. RUQ negative. CT ab/pelvis pending. D/c if unremarkable.  [JR]    Clinical Course User Index [JR] Lang Norleen POUR, PA-C   Medical Decision Making Amount and/or Complexity of Data Reviewed Labs: ordered. Radiology: ordered.  Risk OTC drugs. Prescription drug management. Decision regarding hospitalization.   58 year old well-appearing female presenting for abdominal pain nausea and vomiting.  On reassessment, patient stated her symptoms had improved but she still had some epigastric pain and felt nauseous.  Attempted to treat with oral Zofran , Norco and a GI cocktail.  Shortly after receiving those medications she vomited multiple times stating her abdominal pain was much worse and that she was extremely nauseous.  Treated with IV Zofran , morphine  and 1 L of normal saline.  Admitted to hospital with Dr. Zella for intractable nausea and vomiting with epigastric pain.     Lang Norleen POUR, PA-C 12/28/23 1201    Neysa Caron PARAS, DO 12/28/23 1236

## 2023-12-28 NOTE — Progress Notes (Signed)
 Hypoglycemic Event  CBG: 50  Treatment: D50 50 mL (25 gm)  Symptoms: None  Follow-up CBG: Time:1815 CBG Result:171  Possible Reasons for Event: Vomiting and Inadequate meal intake  Comments/MD notified: See new orders    Peggye Ley

## 2023-12-28 NOTE — Discharge Instructions (Signed)
 Evaluation for your abdominal pain and vomiting was reassuring. Please follow up with your PCP.  If your symptoms worsen, you have fever or any other concerning symptom please return emerged part further evaluation.  I sent Zofran  to your pharmacy.  Recommend assertive hydration as well and advance your diet as tolerated.

## 2023-12-28 NOTE — Progress Notes (Signed)
   12/28/23 1753  Provider Notification  Provider Name/Title Zella Lloyd, MD  Date Provider Notified 12/28/23  Time Provider Notified 1751  Method of Notification Page  Notification Reason Critical Result  Test performed and critical result CBG 50  Date Critical Result Received 12/28/23  Time Critical Result Received 1750  Provider response See new orders  Date of Provider Response 12/28/23

## 2023-12-28 NOTE — ED Provider Notes (Signed)
 [facility] AT [facility] Provider Note   CSN: [id number] Arrival date & time: 12/27/23  2149     History  Chief Complaint  Patient presents with   Abdominal Pain    Haley Harding is a 58 y.o. female.  Patient with past medical history significant for hypertension, iron  deficiency anemia, generalized anxiety disorder presents to the emergency department complaining of upper abdominal pain rated 9 out of 10 in severity.  She states pain began yesterday morning at approximately 9:30 AM.  She reports 6+ episodes of emesis and has been unable to keep any fluids down.  She denies any chest pain, shortness of breath.  Pain is described as being in the upper and upper right quadrants of the abdomen.   Abdominal Pain      Home Medications Prior to Admission medications   Medication Sig Start Date End Date Taking? Authorizing Provider  acyclovir  (ZOVIRAX ) 800 MG tablet Take 1 tablet (800 mg total) by mouth 3 (three) times daily. Patient not taking: Reported on 06/03/2022 11/20/15   Joshua Debby CROME, MD  amLODipine  (NORVASC ) 5 MG tablet Take 1 tablet (5 mg total) by mouth daily. 11/20/15   Joshua Debby CROME, MD  buPROPion  (WELLBUTRIN  XL) 300 MG 24 hr tablet Take 300 mg by mouth daily.    [provider]  glipiZIDE (GLUCOTROL XL) 10 MG 24 hr tablet Take 10 mg by mouth daily with breakfast.    [provider]  ipratropium (ATROVENT) 0.06 % nasal spray Place 1 spray into both nostrils daily as needed for rhinitis. 03/25/22   [provider]  lisinopril  (ZESTRIL ) 5 MG tablet Take 5 mg by mouth daily.    [provider]  metoprolol  tartrate (LOPRESSOR ) 25 MG tablet Take 25 mg by mouth daily.    [provider]  omeprazole  (PRILOSEC) 20 MG capsule Take 1 capsule (20 mg total) by mouth daily. Yearly physical  due in Sept must see md for refills Patient taking differently: Take 20 mg by mouth daily as needed (heart burn).  05/11/16   Joshua Debby CROME, MD  pravastatin (PRAVACHOL) 20 MG tablet Take 20 mg by mouth daily.    [provider]  sertraline  (ZOLOFT ) 100 MG tablet Take 2 tablets (200 mg total) by mouth daily. 06/04/22   Vann, Jessica U, DO  traMADol  (ULTRAM ) 50 MG tablet Take 1 tablet (50 mg total) by mouth every 6 (six) hours as needed for severe pain. 06/04/22   Vann, Jessica U, DO  tretinoin (RETIN-A) 0.05 % cream Apply 1 Application topically daily as needed (bre ak out). 03/20/22   [provider]  valACYclovir (VALTREX) 500 MG tablet Take 500 mg by mouth daily as needed (breakout).    [provider]      Allergies    Duloxetine     Review of Systems   Review of Systems  Gastrointestinal:  Positive for abdominal pain.    Physical Exam Updated Vital Signs BP (!) 156/73   Pulse 70   Temp 97.9 F (36.6 C) (Oral)   Resp 16   Ht 5' 7 (1.702 m)   Wt 80.7 kg   SpO2 100%   BMI 27.88 kg/m  Physical Exam Vitals and nursing note reviewed.  Constitutional:      General: She is not in acute distress.    Appearance: She is well-developed.  HENT:     Head: Normocephalic and atraumatic.  Eyes:     Conjunctiva/sclera: Conjunctivae normal.  Cardiovascular:     Rate and Rhythm: Normal rate and regular rhythm.  Pulmonary:     Effort: Pulmonary effort is normal. No respiratory distress.     Breath sounds: Normal breath sounds.  Abdominal:     Palpations: Abdomen is soft.     Tenderness: There is abdominal tenderness in the right upper quadrant and epigastric area.  Musculoskeletal:        General: No swelling.     Cervical back: Neck supple.  Skin:    General: Skin is warm and dry.     Capillary Refill: Capillary refill takes less than 2 seconds.  Neurological:     Mental Status: She is alert.  Psychiatric:        Mood and Affect: Mood normal.     ED Results / Procedures / Treatments   Labs (all labs ordered are listed, but only abnormal results are  displayed) Labs Reviewed  COMPREHENSIVE METABOLIC PANEL - Abnormal; Notable for the following components:      Result Value   Alkaline Phosphatase 140 (*)    All other components within normal limits  CBC - Abnormal; Notable for the following components:   RBC 5.19 (*)    Hemoglobin 15.3 (*)    HCT 46.2 (*)    All other components within normal limits  LIPASE, BLOOD  HCG, SERUM, QUALITATIVE  URINALYSIS, ROUTINE W REFLEX MICROSCOPIC    EKG None  Radiology US  Abdomen Limited RUQ (LIVER/GB) Result Date: 12/28/2023 CLINICAL DATA:  Right upper quadrant pain. EXAM: ULTRASOUND ABDOMEN LIMITED RIGHT UPPER QUADRANT COMPARISON:  CT with IV contrast 06/21/2022 FINDINGS: Gallbladder: No gallstones or wall thickening visualized. No sonographic Murphy sign noted by sonographer. Common bile duct: Diameter: 2.1 mm, with no intrahepatic bile duct dilatation. Liver: No focal lesion identified. There is mild increased parenchymal echogenicity consistent with fatty replacement. This was seen previously. Portal vein is patent on color Doppler imaging with normal direction of blood flow towards the liver. There is no portal vein dilatation. Other: None. IMPRESSION: 1. No evidence of gallstones, cholecystitis or biliary dilatation. 2. Mild fatty replacement of the liver. Electronically Signed   By: Francis Quam M.D.   On: 12/28/2023 05:16    Procedures Procedures    Medications Ordered in ED Medications  morphine  (PF) 4 MG/ML injection 4 mg (4 mg Intravenous Given 12/28/23 0359)  ondansetron  (ZOFRAN ) injection 4 mg (4 mg Intravenous Given 12/28/23 0359)    ED Course/ Medical Decision Making/ A&P Clinical Course as of 12/28/23 0638  Thu Dec 28, 2023  0633 Abdominal pain with n/v. RUQ negative. CT ab/pelvis pending. D/c if unremarkable.  [JR]    Clinical Course User Index [JR] Haley Haley POUR, PA-C                                 Medical Decision Making Amount and/or Complexity of Data  Reviewed Labs: ordered. Radiology: ordered.  Risk Prescription drug management.   This patient presents to the ED for concern of abdominal pain, this involves an extensive number of treatment options, and is a complaint that carries with it a high risk of complications and morbidity.  The differential diagnosis includes cholecystitis, cholelithiasis, biliary colic, gastritis, others   Co morbidities that complicate the patient evaluation  Hypertension   Additional history obtained:   External records from outside source obtained and reviewed including OB/GYN note   Lab Tests:  I Ordered, and  personally interpreted labs.  The pertinent results include: Grossly unremarkable CMP, CBC, negative pregnancy test, lipase 22,   Imaging Studies ordered:  I ordered imaging studies including ultrasound of the right upper quadrant I independently visualized and interpreted imaging which showed  1. No evidence of gallstones, cholecystitis or biliary dilatation.  2. Mild fatty replacement of the liver.  CT abdomen pelvis with contrast ordered for further evaluation of patient's abdominal tenderness I agree with the radiologist interpretation   Cardiac Monitoring: / EKG:  The patient was maintained on a cardiac monitor.  I personally viewed and interpreted the cardiac monitored which showed an underlying rhythm of: Sinus rhythm   Problem List / ED Course / Critical interventions / Medication management   I ordered medication including morphine  for pain, Zofran  for nausea Reevaluation of the patient after these medicines showed that the patient improved I have reviewed the patients home medicines and have made adjustments as needed   Test / Admission - Considered:  Patient care being transferred to Haley Essex, PA-C at shift handoff. CT abdomen pelvis pending. RUQ ultrasound grossly unremarkable along with labs. Patient feeling better after medication. Disposition pending results  of CT, UA, and reassessment.          Final Clinical Impression(s) / ED Diagnoses Final diagnoses:  Epigastric pain  Nausea and vomiting, unspecified vomiting type    Rx / DC Orders ED Discharge Orders     None         Haley Harding 12/28/23 9361    Haley Charmaine FALCON, MD 12/31/23 [phone]

## 2023-12-28 NOTE — H&P (Signed)
 History and Physical  Haley Harding:[id number] DOB: [birth year] DOA: 12/27/2023  PCP: Harding, Vyvyan, MD   Chief Complaint: Abdominal pain, vomiting  HPI: Haley Harding is a 58 y.o. female with medical history significant for hypertension, non-insulin -dependent type 2 diabetes being admitted to the hospital with suspected viral gastroenteritis.  States she was in her usual state of health until last evening, when she had epigastric burning abdominal pain, which she thought was related to her GERD.  She took some over-the-counter acid reducers, but after this she started vomiting.  Vomited twice, and then decided to come to the ER for evaluation.  Denies any change in bowel or bladder habits, fevers, hematemesis or coffee-ground emesis.  Evaluation in the emergency department as noted below was quite benign, however patient has continued to vomit in the ER and states she has severe pain.  Review of Systems: Please see HPI for pertinent positives and negatives. A complete 10 system review of systems are otherwise negative.  Past Medical History:  Diagnosis Date   Anemia    Anxiety    Chest pain 08/2015   Depression    Herpes genitalia    Hypertension    Iron  deficiency anemia due to chronic blood loss    Mass of uterus 10/07/2013   Menorrhagia    Uterine cyst    Past Surgical History:  Procedure Laterality Date   ESOPHAGOGASTRODUODENOSCOPY N/A 01/24/2014   Procedure: ESOPHAGOGASTRODUODENOSCOPY (EGD);  Surgeon: Gordy CHRISTELLA Starch, MD;  Location: [facility];  Service: Gastroenterology;  Laterality: N/A;   TUBAL LIGATION     Social History:  reports that she has never smoked. She has never used smokeless tobacco. She reports that she does not drink alcohol and does not use drugs.  Allergies  Allergen Reactions   Duloxetine      headache    Family History  Problem Relation Age of Onset   Hypertension Mother    Hypertension Father    Diabetes Father    Cancer Neg Hx    Alcohol  abuse Neg Hx    Early death Neg Hx    Hearing loss Neg Hx    Heart disease Neg Hx    Hyperlipidemia Neg Hx    Kidney disease Neg Hx    Stroke Neg Hx      Prior to Admission medications   Medication Sig Start Date End Date Taking? Authorizing Provider  ondansetron  (ZOFRAN ) 4 MG tablet Take 1 tablet (4 mg total) by mouth every 6 (six) hours. 12/28/23  Yes Lang Norleen POUR, PA-C  acyclovir  (ZOVIRAX ) 800 MG tablet Take 1 tablet (800 mg total) by mouth 3 (three) times daily. Patient not taking: Reported on 06/03/2022 11/20/15   Joshua Debby CROME, MD  amLODipine  (NORVASC ) 5 MG tablet Take 1 tablet (5 mg total) by mouth daily. 11/20/15   Joshua Debby CROME, MD  buPROPion  (WELLBUTRIN  XL) 300 MG 24 hr tablet Take 300 mg by mouth daily.    [provider]  glipiZIDE (GLUCOTROL XL) 10 MG 24 hr tablet Take 10 mg by mouth daily with breakfast.    [provider]  ipratropium (ATROVENT) 0.06 % nasal spray Place 1 spray into both nostrils daily as needed for rhinitis. 03/25/22   [provider]  lisinopril  (ZESTRIL ) 5 MG tablet Take 5 mg by mouth daily.    [provider]  metoprolol  tartrate (LOPRESSOR ) 25 MG tablet Take 25 mg by mouth daily.    [provider]  omeprazole  (PRILOSEC) 20 MG  capsule Take 1 capsule (20 mg total) by mouth daily. Yearly physical  due in Sept must see md for refills Patient taking differently: Take 20 mg by mouth daily as needed (heart burn). 05/11/16   Joshua Debby CROME, MD  pravastatin (PRAVACHOL) 20 MG tablet Take 20 mg by mouth daily.    [provider]  sertraline  (ZOLOFT ) 100 MG tablet Take 2 tablets (200 mg total) by mouth daily. 06/04/22   Vann, Jessica U, DO  traMADol  (ULTRAM ) 50 MG tablet Take 1 tablet (50 mg total) by mouth every 6 (six) hours as needed for severe pain. 06/04/22   Vann, Jessica U, DO  tretinoin (RETIN-A) 0.05 % cream Apply 1 Application topically daily as needed (bre ak out). 03/20/22   [provider]   valACYclovir (VALTREX) 500 MG tablet Take 500 mg by mouth daily as needed (breakout).    [provider]    Physical Exam: BP (!) 128/97 (BP Location: Right Arm)   Pulse 76   Temp (!) 97.4 F (36.3 C) (Oral)   Resp 15   Ht 5' 7 (1.702 m)   Wt 80.7 kg   SpO2 100%   BMI 27.88 kg/m  General:  Alert, oriented, calm, in no acute distress, looks comfortable and nontoxic Cardiovascular: RRR, no murmurs or rubs, no peripheral edema  Respiratory: clear to auscultation bilaterally, no wheezes, no crackles  Abdomen: soft, tender, nondistended, normal bowel tones heard  Skin: dry, no rashes  Musculoskeletal: no joint effusions, normal range of motion  Psychiatric: appropriate affect, normal speech  Neurologic: extraocular muscles intact, clear speech, moving all extremities with intact sensorium         Labs on Admission:  Basic Metabolic Panel: Recent Labs  Lab 12/27/23 2243  NA 139  K 4.2  CL 105  CO2 22  GLUCOSE 77  BUN 18  CREATININE 0.60  CALCIUM  9.7   Liver Function Tests: Recent Labs  Lab 12/27/23 2243  AST 22  ALT 24  ALKPHOS 140*  BILITOT 0.8  PROT 7.7  ALBUMIN 4.4   Recent Labs  Lab 12/27/23 2243  LIPASE 22   No results for input(s): AMMONIA in the last 168 hours. CBC: Recent Labs  Lab 12/27/23 2243  WBC 7.9  HGB 15.3*  HCT 46.2*  MCV 89.0  PLT 364   Cardiac Enzymes: No results for input(s): CKTOTAL, CKMB, CKMBINDEX, TROPONINI in the last 168 hours. BNP (last 3 results) No results for input(s): BNP in the last 8760 hours.  ProBNP (last 3 results) No results for input(s): PROBNP in the last 8760 hours.  CBG: No results for input(s): GLUCAP in the last 168 hours.  Radiological Exams on Admission: CT ABDOMEN PELVIS W CONTRAST Result Date: 12/28/2023 CLINICAL DATA:  Abdominal pain beginning yesterday morning. Vomiting. EXAM: CT ABDOMEN AND PELVIS WITH CONTRAST TECHNIQUE: Multidetector CT imaging of the abdomen and  pelvis was performed using the standard protocol following bolus administration of intravenous contrast. RADIATION DOSE REDUCTION: This exam was performed according to the departmental dose-optimization program which includes automated exposure control, adjustment of the mA and/or kV according to patient size and/or use of iterative reconstruction technique. CONTRAST:  OMNIPAQUE  IOHEXOL  300 MG/ML  SOLN COMPARISON:  06/21/2022. FINDINGS: Lower chest: No acute abnormality. Hepatobiliary: Liver normal in size. Multiple low-attenuation liver masses measuring up to 1.4 cm are stable consistent with cysts. Focal hypoattenuation noted anteriorly along the falciform ligament consistent with focal fatty infiltration, also stable. Normal gallbladder. No bile duct dilation.  Pancreas: Unremarkable. No pancreatic ductal dilatation or surrounding inflammatory changes. Spleen: Normal in size without focal abnormality. Adrenals/Urinary Tract: Adrenal glands are unremarkable. Kidneys are normal, without renal calculi, focal lesion, or hydronephrosis. Bladder is unremarkable. Stomach/Bowel: Small hiatal hernia. Stomach otherwise unremarkable. Small bowel and colon are normal in caliber. No wall thickening or inflammation. Normal appendix visualized. Vascular/Lymphatic: No significant vascular findings are present. No enlarged abdominal or pelvic lymph nodes. Reproductive: Uterus enlarged with a lobulated contour consistent with multiple fibroids, stable. No adnexal masses. Other: No abdominal wall hernia or abnormality. No abdominopelvic ascites. Musculoskeletal: No fracture or acute finding. No bone lesion. Degenerative changes at L5-S1 with a grade 1 anterolisthesis. IMPRESSION: 1. No acute findings within the abdomen or pelvis. No findings to account for the patient's abdominal pain. No bowel obstruction or inflammation. Electronically Signed   By: Alm Parkins M.D.   On: 12/28/2023 07:27   US  Abdomen Limited RUQ  (LIVER/GB) Result Date: 12/28/2023 CLINICAL DATA:  Right upper quadrant pain. EXAM: ULTRASOUND ABDOMEN LIMITED RIGHT UPPER QUADRANT COMPARISON:  CT with IV contrast 06/21/2022 FINDINGS: Gallbladder: No gallstones or wall thickening visualized. No sonographic Murphy sign noted by sonographer. Common bile duct: Diameter: 2.1 mm, with no intrahepatic bile duct dilatation. Liver: No focal lesion identified. There is mild increased parenchymal echogenicity consistent with fatty replacement. This was seen previously. Portal vein is patent on color Doppler imaging with normal direction of blood flow towards the liver. There is no portal vein dilatation. Other: None. IMPRESSION: 1. No evidence of gallstones, cholecystitis or biliary dilatation. 2. Mild fatty replacement of the liver. Electronically Signed   By: Francis Quam M.D.   On: 12/28/2023 05:16   Assessment/Plan ZOHAL RENY is a 58 y.o. female with medical history significant for hypertension, non-insulin -dependent type 2 diabetes being admitted to the hospital with suspected viral gastroenteritis.  Gastroenteritis-suspected due to intractable abdominal discomfort, vomiting, but no evidence of obstruction, bacterial infection, etc. seen on imaging.  No known sick contacts, though she does have a young grandson who is in school. -Observation admission -Clear liquid diet, with plan to advance as tolerated -Supportive care with IV Reglan  and Dilaudid  as needed  Hypertension-continue home metoprolol   Diabetes-update A1c, carb controlled diet, sliding scale in the meantime  DVT prophylaxis: Lovenox      Code Status: Full Code  Consults called: None  Admission status: Observation  Time spent: 48 minutes  Yanessa Hocevar CHRISTELLA Gail MD [facility] Pager [phone]  If 7PM-7AM, please contact night-coverage [email or web address] Password [email or web address]  12/28/2023, 12:02 PM

## 2023-12-29 DIAGNOSIS — E119 Type 2 diabetes mellitus without complications: Secondary | ICD-10-CM

## 2023-12-29 DIAGNOSIS — I10 Essential (primary) hypertension: Secondary | ICD-10-CM | POA: Diagnosis not present

## 2023-12-29 DIAGNOSIS — K529 Noninfective gastroenteritis and colitis, unspecified: Secondary | ICD-10-CM | POA: Diagnosis not present

## 2023-12-29 LAB — BASIC METABOLIC PANEL
Anion gap: 12 (ref 5–15)
BUN: 18 mg/dL (ref 6–20)
CO2: 26 mmol/L (ref 22–32)
Calcium: 9.3 mg/dL (ref 8.9–10.3)
Chloride: 103 mmol/L (ref 98–111)
Creatinine, Ser: 0.82 mg/dL (ref 0.44–1.00)
GFR, Estimated: >60 mL/min (ref >60)
Glucose, Bld: 94 mg/dL (ref 70–99)
Potassium: 4.1 mmol/L (ref 3.5–5.1)
Sodium: 141 mmol/L (ref 135–145)

## 2023-12-29 LAB — CBC
HCT: 41.2 % (ref 36.0–46.0)
Hemoglobin: 13.9 g/dL (ref 12.0–15.0)
MCH: 29.9 pg (ref 26.0–34.0)
MCHC: 33.7 g/dL (ref 30.0–36.0)
MCV: 88.6 fL (ref 80.0–100.0)
Platelets: 325 10*3/uL (ref 150–400)
RBC: 4.65 MIL/uL (ref 3.87–5.11)
RDW: 13 % (ref 11.5–15.5)
WBC: 5.4 10*3/uL (ref 4.0–10.5)
nRBC: 0 % (ref 0.0–0.2)

## 2023-12-29 LAB — GLUCOSE, CAPILLARY
Glucose-Capillary: 127 mg/dL — ABNORMAL HIGH (ref 70–99)
Glucose-Capillary: 61 mg/dL — ABNORMAL LOW (ref 70–99)
Glucose-Capillary: 70 mg/dL (ref 70–99)
Glucose-Capillary: 75 mg/dL (ref 70–99)
Glucose-Capillary: 75 mg/dL (ref 70–99)
Glucose-Capillary: 83 mg/dL (ref 70–99)
Glucose-Capillary: 83 mg/dL (ref 70–99)

## 2023-12-29 MED ORDER — DEXTROSE 5 % IV SOLN: INTRAVENOUS | Status: DC

## 2023-12-29 MED ORDER — PROCHLORPERAZINE EDISYLATE 10 MG/2ML IJ SOLN
10.0000 mg | Freq: Four times a day (QID) | INTRAMUSCULAR | Status: DC | PRN
  Administered 2023-12-29 – 2023-12-30 (×2): 10 mg via INTRAVENOUS
  Filled 2023-12-29 (×2): qty 2

## 2023-12-29 MED ORDER — HYDRALAZINE HCL 20 MG/ML IJ SOLN
10.0000 mg | Freq: Four times a day (QID) | INTRAMUSCULAR | Status: DC | PRN
  Administered 2023-12-29: 10 mg via INTRAVENOUS
  Filled 2023-12-29: qty 1

## 2023-12-29 MED ORDER — DEXTROSE 50 % IV SOLN
12.5000 g | INTRAVENOUS | Status: AC
  Administered 2023-12-29: 12.5 g via INTRAVENOUS
  Filled 2023-12-29: qty 50

## 2023-12-29 MED ORDER — SODIUM CHLORIDE 0.9 % IV BOLUS
1000.0000 mL | Freq: Once | INTRAVENOUS | Status: AC
  Administered 2023-12-29: 1000 mL via INTRAVENOUS

## 2023-12-29 MED ORDER — PANTOPRAZOLE SODIUM 40 MG IV SOLR
40.0000 mg | Freq: Two times a day (BID) | INTRAVENOUS | Status: DC
  Administered 2023-12-29 – 2023-12-31 (×4): 40 mg via INTRAVENOUS
  Filled 2023-12-29 (×4): qty 10

## 2023-12-29 MED ORDER — KCL IN DEXTROSE-NACL 40-5-0.9 MEQ/L-%-% IV SOLN
INTRAVENOUS | Status: AC
  Filled 2023-12-29: qty 1000

## 2023-12-29 NOTE — Progress Notes (Signed)
 PROGRESS NOTE    Haley Harding  FMW:[id number] DOB: [birth year] DOA: 12/27/2023 PCP: Sun, Vyvyan, MD   Brief Narrative:  The patient is a 58 year old overweight African-American female with a past medical history significant for but limited to hypertension, diabetes mellitus type 2 that is non-insulin -dependent, as well as other comorbidities including anemia, anxiety, and depression who presents to the hospital with abdominal pain and vomiting.  She states that she was in her usual state of health the evening before last when she started developing epigastric burning abdominal discomfort and pain which she thought was related to GERD.  She took some over-the-counter acid reducers but then after that she started vomiting and vomited about twice and then decided come to the ED for further evaluation.  She denies any changes in bowel or bladder habits or fevers or hematemesis or coffee-ground emesis.  Evaluation in the ED was relatively benign however she continued to vomit and states that she has severe pain so she was admitted for further evaluation. CT scan of the Abd/Pelvis done and showed no acute findings within the abdomen or pelvis. No findings to account for the patient's abdominal pain. No bowel obstruction or Inflammation patient also had a right upper quadrant ultrasound which showed No evidence of gallstones, cholecystitis or biliary dilatation. Mild fatty replacement of the liver.  Continues to have nausea and vomiting so we will change her antiemetics.  If not improving will consider GI consultation.  Assessment and Plan:  Nausea, Vomiting, and Abdominal Pain and Epigastric Burning  -Likely Viral Gastroenteritis -C/w Supportive Care -Given another 1 Liter Bolus and will change to D5W at 50 mL/h to D5 normal saline at 75 mL/hr x 1 Day -Continue with antiemetics with metoclopramide  10 mg IV every 6 as needed for nausea vomiting as well as Compazine  10 mg IV every 6 as needed for  refractory nausea vomiting -On clear liquid diet and not tolerating much p.o. at all; will go to full liquids just to see if she can tolerate anything better -CT scan and right upper quadrant ultrasound unrevealing as above -If not improving will need to get GI involved  Essential HTN -Unable to tolerate much po adequately and will continue FLD -Add IV Hydralazine  10 mg q6hprn for SBP >160 or DBP >100 -Continue to Monitor BP per Protocol -Currently holding her amlodipine  5 mg p.o. daily, lisinopril  5 mg for daily, Toprol  tartrate 25 mL p.o. twice daily -Last BP reading was 151/76  Non-Insulin -Dependent Diabetes Mellitus Type 2 with multiple hypoglycemic episodes -Hemoglobin A1c was 5.9 and relatively well-controlled -Currently holding her glipizide 10 mg p.o. nightly -C/w Moderate Novolog  SSI AC/HS -CBG and glucose trend Recent Labs  Lab 12/28/23 2118 12/28/23 2216 12/29/23 0045 12/29/23 0116 12/29/23 0351 12/29/23 0736 12/29/23 1212  GLUCAP 51* 145* 61* 127* 70 83 75   Recent Labs  Lab 12/27/23 2243 12/29/23 0428  GLUCOSE 77 94  -Changing IV fluid hydration as above  Hyperlipidemia -Currently holding her pravastatin 20 was p.o. daily until she is able to tolerate more p.o. intake  Depression and Anxiety -Currently holding her Sertraline  200 mg p.o. twice daily and Bupropion  300 was p.o. daily dose is able to tolerate p.o. intake  Overweight -Complicates overall prognosis and care -Estimated body mass index is 27.31 kg/m as calculated from the following:   Height as of this encounter: 5' 7 (1.702 m).   Weight as of this encounter: 79.1 kg.  -Weight Loss and Dietary Counseling given  DVT prophylaxis: enoxaparin  (  LOVENOX ) injection 40 mg Start: 12/28/23 1400 SCDs Start: 12/28/23 1200    Code Status: Full Code Family Communication: No family currently at bedside  Disposition Plan:  Level of care: Med-Surg Status is: Observation The patient will require care  spanning > 2 midnights and should be moved to inpatient because: The patient is still not able to take in adequate oral intake so we will place on IV fluid hydration and continue to monitor with supportive care   Consultants:  None  Procedures:  As delineated as above  Antimicrobials:  Anti-infectives (From admission, onward)    None       Subjective: Seen and examined, still complaining of some nausea and abdominal discomfort.  No chest pain or lightheadedness or dizziness.  Continues not taking oral intake adequately.  No other concerns or complaints at this time.  Objective: Vitals:   12/29/23 1100 12/29/23 1102 12/29/23 1217 12/29/23 1526  BP: (!) 168/96 (!) 168/96 (!) 153/86 (!) 151/76  Pulse:  75 70   Resp:  18    Temp: 98.7 F (37.1 C)  98.1 F (36.7 C)   TempSrc:      SpO2:  100% 100%   Weight:      Height:        Intake/Output Summary (Last 24 hours) at 12/29/2023 1610 Last data filed at 12/28/2023 2100 Gross per 24 hour  Intake 180 ml  Output --  Net 180 ml   Filed Weights   12/27/23 2222 12/28/23 1600  Weight: 80.7 kg 79.1 kg   Examination: Physical Exam:  Constitutional: WN/WD overweight African-American female no acute distress Respiratory: Diminished to auscultation bilaterally, no wheezing, rales, rhonchi or crackles. Normal respiratory effort and patient is not tachypenic. No accessory muscle use.  Unlabored breathing Cardiovascular: RRR, no murmurs / rubs / gallops. S1 and S2 auscultated. No extremity edema. Abdomen: Soft, tender to palpate and distended slightly secondary to body habitus. Bowel sounds positive.  GU: Deferred. Musculoskeletal: No clubbing / cyanosis of digits/nails. No joint deformity upper and lower extremities. Skin: No rashes, lesions, ulcers. No induration; Warm and dry.  Neurologic: CN 2-12 grossly intact with no focal deficits. Romberg sign and cerebellar reflexes not assessed.  Psychiatric: Normal judgment and insight. Alert  and oriented x 3. Normal mood and appropriate affect.   Data Reviewed: I have personally reviewed following labs and imaging studies  CBC: Recent Labs  Lab 12/27/23 2243 12/29/23 0428  WBC 7.9 5.4  HGB 15.3* 13.9  HCT 46.2* 41.2  MCV 89.0 88.6  PLT 364 325   Basic Metabolic Panel: Recent Labs  Lab 12/27/23 2243 12/29/23 0428  NA 139 141  K 4.2 4.1  CL 105 103  CO2 22 26  GLUCOSE 77 94  BUN 18 18  CREATININE 0.60 0.82  CALCIUM  9.7 9.3   GFR: Estimated Creatinine Clearance: 83 mL/min (by C-G formula based on SCr of 0.82 mg/dL). Liver Function Tests: Recent Labs  Lab 12/27/23 2243  AST 22  ALT 24  ALKPHOS 140*  BILITOT 0.8  PROT 7.7  ALBUMIN 4.4   Recent Labs  Lab 12/27/23 2243  LIPASE 22   No results for input(s): AMMONIA in the last 168 hours. Coagulation Profile: No results for input(s): INR, PROTIME in the last 168 hours. Cardiac Enzymes: No results for input(s): CKTOTAL, CKMB, CKMBINDEX, TROPONINI in the last 168 hours. BNP (last 3 results) No results for input(s): PROBNP in the last 8760 hours. HbA1C: Recent Labs    12/28/23 1713  HGBA1C 5.9*   CBG: Recent Labs  Lab 12/29/23 0045 12/29/23 0116 12/29/23 0351 12/29/23 0736 12/29/23 1212  GLUCAP 61* 127* 70 83 75   Lipid Profile: No results for input(s): CHOL, HDL, LDLCALC, TRIG, CHOLHDL, LDLDIRECT in the last 72 hours. Thyroid  Function Tests: No results for input(s): TSH, T4TOTAL, FREET4, T3FREE, THYROIDAB in the last 72 hours. Anemia Panel: No results for input(s): VITAMINB12, FOLATE, FERRITIN, TIBC, IRON , RETICCTPCT in the last 72 hours. Sepsis Labs: No results for input(s): PROCALCITON, LATICACIDVEN in the last 168 hours.  No results found for this or any previous visit (from the past 240 hours).   Radiology Studies: CT ABDOMEN PELVIS W CONTRAST Result Date: 12/28/2023 CLINICAL DATA:  Abdominal pain beginning yesterday morning.  Vomiting. EXAM: CT ABDOMEN AND PELVIS WITH CONTRAST TECHNIQUE: Multidetector CT imaging of the abdomen and pelvis was performed using the standard protocol following bolus administration of intravenous contrast. RADIATION DOSE REDUCTION: This exam was performed according to the departmental dose-optimization program which includes automated exposure control, adjustment of the mA and/or kV according to patient size and/or use of iterative reconstruction technique. CONTRAST:  OMNIPAQUE  IOHEXOL  300 MG/ML  SOLN COMPARISON:  06/21/2022. FINDINGS: Lower chest: No acute abnormality. Hepatobiliary: Liver normal in size. Multiple low-attenuation liver masses measuring up to 1.4 cm are stable consistent with cysts. Focal hypoattenuation noted anteriorly along the falciform ligament consistent with focal fatty infiltration, also stable. Normal gallbladder. No bile duct dilation. Pancreas: Unremarkable. No pancreatic ductal dilatation or surrounding inflammatory changes. Spleen: Normal in size without focal abnormality. Adrenals/Urinary Tract: Adrenal glands are unremarkable. Kidneys are normal, without renal calculi, focal lesion, or hydronephrosis. Bladder is unremarkable. Stomach/Bowel: Small hiatal hernia. Stomach otherwise unremarkable. Small bowel and colon are normal in caliber. No wall thickening or inflammation. Normal appendix visualized. Vascular/Lymphatic: No significant vascular findings are present. No enlarged abdominal or pelvic lymph nodes. Reproductive: Uterus enlarged with a lobulated contour consistent with multiple fibroids, stable. No adnexal masses. Other: No abdominal wall hernia or abnormality. No abdominopelvic ascites. Musculoskeletal: No fracture or acute finding. No bone lesion. Degenerative changes at L5-S1 with a grade 1 anterolisthesis. IMPRESSION: 1. No acute findings within the abdomen or pelvis. No findings to account for the patient's abdominal pain. No bowel obstruction or  inflammation. Electronically Signed   By: Alm Parkins M.D.   On: 12/28/2023 07:27   US  Abdomen Limited RUQ (LIVER/GB) Result Date: 12/28/2023 CLINICAL DATA:  Right upper quadrant pain. EXAM: ULTRASOUND ABDOMEN LIMITED RIGHT UPPER QUADRANT COMPARISON:  CT with IV contrast 06/21/2022 FINDINGS: Gallbladder: No gallstones or wall thickening visualized. No sonographic Murphy sign noted by sonographer. Common bile duct: Diameter: 2.1 mm, with no intrahepatic bile duct dilatation. Liver: No focal lesion identified. There is mild increased parenchymal echogenicity consistent with fatty replacement. This was seen previously. Portal vein is patent on color Doppler imaging with normal direction of blood flow towards the liver. There is no portal vein dilatation. Other: None. IMPRESSION: 1. No evidence of gallstones, cholecystitis or biliary dilatation. 2. Mild fatty replacement of the liver. Electronically Signed   By: Francis Quam M.D.   On: 12/28/2023 05:16   Scheduled Meds:  enoxaparin  (LOVENOX ) injection  40 mg Subcutaneous Q24H   insulin  aspart  0-15 Units Subcutaneous TID WC   insulin  aspart  0-5 Units Subcutaneous QHS   pantoprazole  (PROTONIX ) IV  40 mg Intravenous Q12H   Continuous Infusions:  dextrose  5 % and 0.9 % NaCl with KCl 40 mEq/L  LOS: 0 days   Alejandro Marker, DO [facility] Available via Epic secure chat 7am-7pm After these hours, please refer to coverage provider listed on [email or web address] 12/29/2023, 4:10 PM

## 2023-12-29 NOTE — Plan of Care (Signed)

## 2023-12-29 NOTE — Hospital Course (Addendum)
 The patient is a 58 year old overweight African-American female with a past medical history significant for but limited to hypertension, diabetes mellitus type 2 that is non-insulin -dependent, as well as other comorbidities including anemia, anxiety, and depression who presents to the hospital with abdominal pain and vomiting.  She states that she was in her usual state of health the evening before last when she started developing epigastric burning abdominal discomfort and pain which she thought was related to GERD.  She took some over-the-counter acid reducers but then after that she started vomiting and vomited about twice and then decided come to the ED for further evaluation.  She denies any changes in bowel or bladder habits or fevers or hematemesis or coffee-ground emesis.  Evaluation in the ED was relatively benign however she continued to vomit and states that she has severe pain so she was admitted for further evaluation. CT scan of the Abd/Pelvis done and showed no acute findings within the abdomen or pelvis. No findings to account for the patient's abdominal pain. No bowel obstruction or Inflammation patient also had a right upper quadrant ultrasound which showed No evidence of gallstones, cholecystitis or biliary dilatation. Mild fatty replacement of the liver.  Continued to have nausea and vomiting so we changed her antiemetics. She is slowly improving so will advance Diet to a Soft diet today but if she fails to make any more progress will consider a GI consultation.   Assessment and Plan:  Nausea, Vomiting, and Abdominal Pain and Epigastric Burning  -Likely Viral Gastroenteritis -C/w Supportive Care -Given a 1 Liter Bolus yesterday and will change to D5W at 50 mL/h to D5 normal saline at 75 mL/hr x 1 Day which will expire today  -Continue with antiemetics with metoclopramide  10 mg IV every 6 as needed for nausea vomiting as well as Compazine  10 mg IV every 6 as needed for refractory  nausea vomiting -Holding her home PPI with omeprazole  and will substitute with IV Pantoprazole  40 mg but changed to twice daily -Was on a full liquid diet and unable to tolerate this yesterday so today we will go to soft diet.  -CT scan and right upper quadrant ultrasound unrevealing as above -Symptoms improved significantly and she is back to her baseline had no more nausea or vomiting.  She is stable for discharge and will send her prescription for ondansetron  in case she becomes nauseous again  Essential HTN -Unable to tolerate much po adequately and will continue FLD -Add IV Hydralazine  10 mg q6hprn for SBP >160 or DBP >100 -Continue to Monitor BP per Protocol -Currently holding her amlodipine  5 mg p.o. daily, lisinopril  5 mg for daily, Toprol  tartrate 25 mL p.o. twice daily -Last BP reading was 151/94  Non-Insulin -Dependent Diabetes Mellitus Type 2 with multiple hypoglycemic episodes -Hemoglobin A1c was 5.9 and relatively well-controlled -Currently holding her glipizide 10 mg p.o. nightly but likely can be resumed at discharge show she is eating again more -C/w Moderate Novolog  SSI AC/HS -CBG and glucose trend Recent Labs  Lab 12/29/23 2052 12/30/23 0750 12/30/23 1157 12/30/23 1630 12/30/23 2049 12/31/23 0742 12/31/23 1128  GLUCAP 83 91 94 102* 86 78 68*   Recent Labs  Lab 12/27/23 2243 12/29/23 0428 12/30/23 0538 12/31/23 0950  GLUCOSE 77 94 100* 107*  -IVF stopped -Follow-up in outpatient setting with PCP and have further medication adjustments at that time  Hyperlipidemia -Currently holding her Pravastatin 20 was p.o. daily until she is able to tolerate po intake regularly   Hyponatremia -Mild.  Na+ Trend: Recent Labs  Lab 12/27/23 2243 12/29/23 0428 12/30/23 0538 12/31/23 0950  NA 139 141 139 133*  -Continue to Monitor and Trend and repeat CMP within 1 week  Depression and Anxiety -Currently holding her Sertraline  200 mg p.o. twice daily and Bupropion  300  was p.o. daily dose is able to tolerate p.o. intake regularly   Overweight -Complicates overall prognosis and care -Estimated body mass index is 27.31 kg/m as calculated from the following:   Height as of this encounter: 5' 7 (1.702 m).   Weight as of this encounter: 79.1 kg.  -Weight Loss and Dietary Counseling given

## 2023-12-30 DIAGNOSIS — R112 Nausea with vomiting, unspecified: Secondary | ICD-10-CM | POA: Diagnosis not present

## 2023-12-30 DIAGNOSIS — K529 Noninfective gastroenteritis and colitis, unspecified: Secondary | ICD-10-CM | POA: Diagnosis not present

## 2023-12-30 DIAGNOSIS — I10 Essential (primary) hypertension: Secondary | ICD-10-CM | POA: Diagnosis not present

## 2023-12-30 DIAGNOSIS — E119 Type 2 diabetes mellitus without complications: Secondary | ICD-10-CM | POA: Diagnosis not present

## 2023-12-30 DIAGNOSIS — E871 Hypo-osmolality and hyponatremia: Secondary | ICD-10-CM | POA: Diagnosis not present

## 2023-12-30 LAB — GLUCOSE, CAPILLARY
Glucose-Capillary: 91 mg/dL (ref 70–99)
Glucose-Capillary: 94 mg/dL (ref 70–99)
Glucose-Capillary: 102 mg/dL — ABNORMAL HIGH (ref 70–99)
Glucose-Capillary: 86 mg/dL (ref 70–99)

## 2023-12-30 LAB — CBC WITH DIFFERENTIAL/PLATELET
Abs Immature Granulocytes: 0.01 10*3/uL (ref 0.00–0.07)
Basophils Absolute: 0 10*3/uL (ref 0.0–0.1)
Basophils Relative: 1 %
Eosinophils Absolute: 0.1 10*3/uL (ref 0.0–0.5)
Eosinophils Relative: 2 %
HCT: 40.7 % (ref 36.0–46.0)
Hemoglobin: 14 g/dL (ref 12.0–15.0)
Immature Granulocytes: 0 %
Lymphocytes Relative: 27 %
Lymphs Abs: 1.3 10*3/uL (ref 0.7–4.0)
MCH: 29.8 pg (ref 26.0–34.0)
MCHC: 34.4 g/dL (ref 30.0–36.0)
MCV: 86.6 fL (ref 80.0–100.0)
Monocytes Absolute: 0.5 10*3/uL (ref 0.1–1.0)
Monocytes Relative: 10 %
Neutro Abs: 2.9 10*3/uL (ref 1.7–7.7)
Neutrophils Relative %: 60 %
Platelets: 318 10*3/uL (ref 150–400)
RBC: 4.7 MIL/uL (ref 3.87–5.11)
RDW: 12.8 % (ref 11.5–15.5)
WBC: 4.7 10*3/uL (ref 4.0–10.5)
nRBC: 0 % (ref 0.0–0.2)

## 2023-12-30 LAB — COMPREHENSIVE METABOLIC PANEL
ALT: 18 U/L (ref 0–44)
AST: 17 U/L (ref 15–41)
Albumin: 3.8 g/dL (ref 3.5–5.0)
Alkaline Phosphatase: 114 U/L (ref 38–126)
Anion gap: 9 (ref 5–15)
BUN: 8 mg/dL (ref 6–20)
CO2: 25 mmol/L (ref 22–32)
Calcium: 9.2 mg/dL (ref 8.9–10.3)
Chloride: 105 mmol/L (ref 98–111)
Creatinine, Ser: 0.63 mg/dL (ref 0.44–1.00)
GFR, Estimated: >60 mL/min (ref >60)
Glucose, Bld: 100 mg/dL — ABNORMAL HIGH (ref 70–99)
Potassium: 4 mmol/L (ref 3.5–5.1)
Sodium: 139 mmol/L (ref 135–145)
Total Bilirubin: 0.6 mg/dL (ref 0.0–1.2)
Total Protein: 7 g/dL (ref 6.5–8.1)

## 2023-12-30 LAB — PHOSPHORUS: Phosphorus: 3.3 mg/dL (ref 2.5–4.6)

## 2023-12-30 LAB — MAGNESIUM: Magnesium: 2.1 mg/dL (ref 1.7–2.4)

## 2023-12-30 MED ORDER — LISINOPRIL 20 MG PO TABS
20.0000 mg | ORAL_TABLET | Freq: Every day | ORAL | Status: DC
  Administered 2023-12-30 – 2023-12-31 (×2): 20 mg via ORAL
  Filled 2023-12-30 (×2): qty 1

## 2023-12-30 MED ORDER — METOPROLOL TARTRATE 50 MG PO TABS
50.0000 mg | ORAL_TABLET | Freq: Every morning | ORAL | Status: DC
  Administered 2023-12-30 – 2023-12-31 (×2): 50 mg via ORAL
  Filled 2023-12-30 (×2): qty 1

## 2023-12-30 NOTE — Progress Notes (Signed)
 PROGRESS NOTE    Haley Harding  FMW:[id number] DOB: [birth year] DOA: 12/27/2023 PCP: Sun, Vyvyan, MD   Brief Narrative:  The patient is a 58 year old overweight African-American female with a past medical history significant for but limited to hypertension, diabetes mellitus type 2 that is non-insulin -dependent, as well as other comorbidities including anemia, anxiety, and depression who presents to the hospital with abdominal pain and vomiting.  She states that she was in her usual state of health the evening before last when she started developing epigastric burning abdominal discomfort and pain which she thought was related to GERD.  She took some over-the-counter acid reducers but then after that she started vomiting and vomited about twice and then decided come to the ED for further evaluation.  She denies any changes in bowel or bladder habits or fevers or hematemesis or coffee-ground emesis.  Evaluation in the ED was relatively benign however she continued to vomit and states that she has severe pain so she was admitted for further evaluation. CT scan of the Abd/Pelvis done and showed no acute findings within the abdomen or pelvis. No findings to account for the patient's abdominal pain. No bowel obstruction or Inflammation patient also had a right upper quadrant ultrasound which showed No evidence of gallstones, cholecystitis or biliary dilatation. Mild fatty replacement of the liver.  Continued to have nausea and vomiting so we changed her antiemetics. She is slowly improving so will advance Diet to a Soft diet today but if she fails to make any more progress will consider a GI consultation.   Assessment and Plan:  Nausea, Vomiting, and Abdominal Pain and Epigastric Burning  -Likely Viral Gastroenteritis -C/w Supportive Care -Given a 1 Liter Bolus yesterday and will change to D5W at 50 mL/h to D5 normal saline at 75 mL/hr x 1 Day which will expire today  -Continue with antiemetics  with metoclopramide  10 mg IV every 6 as needed for nausea vomiting as well as Compazine  10 mg IV every 6 as needed for refractory nausea vomiting -Holding her home PPI with omeprazole  and will substitute with IV Pantoprazole  40 mg but changed to twice daily -Was on a full liquid diet and unable to tolerate this yesterday so today we will go to soft diet.  -CT scan and right upper quadrant ultrasound unrevealing as above -Symptoms are improving currently but If worsen and not improving will need to get GI involved  Essential HTN -Unable to tolerate much po adequately and will continue FLD -Add IV Hydralazine  10 mg q6hprn for SBP >160 or DBP >100 -Continue to Monitor BP per Protocol -Currently holding her amlodipine  5 mg p.o. daily, lisinopril  5 mg for daily, Toprol  tartrate 25 mL p.o. twice daily -Last BP reading was 151/94  Non-Insulin -Dependent Diabetes Mellitus Type 2 with multiple hypoglycemic episodes -Hemoglobin A1c was 5.9 and relatively well-controlled -Currently holding her glipizide 10 mg p.o. nightly -C/w Moderate Novolog  SSI AC/HS -CBG and glucose trend Recent Labs  Lab 12/29/23 0351 12/29/23 0736 12/29/23 1212 12/29/23 1651 12/29/23 2052 12/30/23 0750 12/30/23 1157  GLUCAP 70 83 75 75 83 91 94   Recent Labs  Lab 12/27/23 2243 12/29/23 0428 12/30/23 0538  GLUCOSE 77 94 100*  -IVF to stop today   Hyperlipidemia -Currently holding her Pravastatin 20 was p.o. daily until she is able to tolerate po intake regularly   Depression and Anxiety -Currently holding her Sertraline  200 mg p.o. twice daily and Bupropion  300 was p.o. daily dose is able to tolerate p.o. intake  regularly   Overweight -Complicates overall prognosis and care -Estimated body mass index is 27.31 kg/m as calculated from the following:   Height as of this encounter: 5' 7 (1.702 m).   Weight as of this encounter: 79.1 kg.  -Weight Loss and Dietary Counseling given   DVT prophylaxis: enoxaparin   (LOVENOX ) injection 40 mg Start: 12/28/23 1400 SCDs Start: 12/28/23 1200    Code Status: Full Code Family Communication: No family present at bedside  Disposition Plan:  Level of care: Med-Surg Status is: Observation The patient will require care spanning > 2 midnights and should be moved to inpatient because: Needs to tolerate diet adequately    Consultants:  None  Procedures:  As delineated as above   Antimicrobials:  Anti-infectives (From admission, onward)    None       Subjective: Seen and examined at bedside and she is feeling better today.  Not as nauseous and still having some abdominal pain but not as much as it was yesterday.  Able to tolerate full liquid diet yesterday.  Will go to soft today.  No other concerns or complaints at this time and feels like she is improving  Objective: Vitals:   12/29/23 1942 12/29/23 1958 12/29/23 2015 12/30/23 0518  BP: (!) 165/80 (!) 165/80 (!) 150/82 (!) 151/94  Pulse: 64  75 77  Resp: 17   17  Temp: 97.8 F (36.6 C)   97.6 F (36.4 C)  TempSrc:      SpO2: 100%   100%  Weight:      Height:        Intake/Output Summary (Last 24 hours) at 12/30/2023 1240 Last data filed at 12/29/2023 2018 Gross per 24 hour  Intake 1650.53 ml  Output 2 ml  Net 1648.53 ml   Filed Weights   12/27/23 2222 12/28/23 1600  Weight: 80.7 kg 79.1 kg   Examination: Physical Exam:  Constitutional: WN/WD overweight African-American female in no acute distress Respiratory: Slightly to auscultation bilaterally, no wheezing, rales, rhonchi or crackles. Normal respiratory effort and patient is not tachypenic. No accessory muscle use.  Unlabored breathing Cardiovascular: RRR, no murmurs / rubs / gallops. S1 and S2 auscultated. No extremity edema. Abdomen: Soft, mildly-tender, distended secondary to body habitus. Bowel sounds positive.  GU: Deferred. Musculoskeletal: No clubbing / cyanosis of digits/nails. No joint deformity upper and lower  extremities.  Skin: No rashes, lesions, ulcers on limited skin evaluation. No induration; Warm and dry.  Neurologic: CN 2-12 grossly intact with no focal deficits.  Romberg sign and cerebellar reflexes not assessed.  Psychiatric: Normal judgment and insight. Alert and oriented x 3. Normal mood and appropriate affect.   Data Reviewed: I have personally reviewed following labs and imaging studies  CBC: Recent Labs  Lab 12/27/23 2243 12/29/23 0428 12/30/23 0538  WBC 7.9 5.4 4.7  NEUTROABS  --   --  2.9  HGB 15.3* 13.9 14.0  HCT 46.2* 41.2 40.7  MCV 89.0 88.6 86.6  PLT 364 325 318   Basic Metabolic Panel: Recent Labs  Lab 12/27/23 2243 12/29/23 0428 12/30/23 0538  NA 139 141 139  K 4.2 4.1 4.0  CL 105 103 105  CO2 22 26 25   GLUCOSE 77 94 100*  BUN 18 18 8   CREATININE 0.60 0.82 0.63  CALCIUM  9.7 9.3 9.2  MG  --   --  2.1  PHOS  --   --  3.3   GFR: Estimated Creatinine Clearance: 85 mL/min (by C-G formula based on  SCr of 0.63 mg/dL). Liver Function Tests: Recent Labs  Lab 12/27/23 2243 12/30/23 0538  AST 22 17  ALT 24 18  ALKPHOS 140* 114  BILITOT 0.8 0.6  PROT 7.7 7.0  ALBUMIN 4.4 3.8   Recent Labs  Lab 12/27/23 2243  LIPASE 22   No results for input(s): AMMONIA in the last 168 hours. Coagulation Profile: No results for input(s): INR, PROTIME in the last 168 hours. Cardiac Enzymes: No results for input(s): CKTOTAL, CKMB, CKMBINDEX, TROPONINI in the last 168 hours. BNP (last 3 results) No results for input(s): PROBNP in the last 8760 hours. HbA1C: Recent Labs    12/28/23 1713  HGBA1C 5.9*   CBG: Recent Labs  Lab 12/29/23 1212 12/29/23 1651 12/29/23 2052 12/30/23 0750 12/30/23 1157  GLUCAP 75 75 83 91 94   Lipid Profile: No results for input(s): CHOL, HDL, LDLCALC, TRIG, CHOLHDL, LDLDIRECT in the last 72 hours. Thyroid  Function Tests: No results for input(s): TSH, T4TOTAL, FREET4, T3FREE, THYROIDAB in the  last 72 hours. Anemia Panel: No results for input(s): VITAMINB12, FOLATE, FERRITIN, TIBC, IRON , RETICCTPCT in the last 72 hours. Sepsis Labs: No results for input(s): PROCALCITON, LATICACIDVEN in the last 168 hours.  No results found for this or any previous visit (from the past 240 hours).   Radiology Studies: No results found.  Scheduled Meds:  enoxaparin  (LOVENOX ) injection  40 mg Subcutaneous Q24H   insulin  aspart  0-15 Units Subcutaneous TID WC   insulin  aspart  0-5 Units Subcutaneous QHS   pantoprazole  (PROTONIX ) IV  40 mg Intravenous Q12H   Continuous Infusions:  dextrose  5 % and 0.9 % NaCl with KCl 40 mEq/L 40 mL/hr at 12/29/23 1824    LOS: 0 days   Alejandro Marker, DO [facility] Available via Epic secure chat 7am-7pm After these hours, please refer to coverage provider listed on [email or web address] 12/30/2023, 12:40 PM

## 2023-12-30 NOTE — Plan of Care (Signed)

## 2023-12-31 DIAGNOSIS — R112 Nausea with vomiting, unspecified: Secondary | ICD-10-CM | POA: Diagnosis not present

## 2023-12-31 DIAGNOSIS — K529 Noninfective gastroenteritis and colitis, unspecified: Secondary | ICD-10-CM | POA: Diagnosis not present

## 2023-12-31 DIAGNOSIS — E871 Hypo-osmolality and hyponatremia: Secondary | ICD-10-CM | POA: Diagnosis not present

## 2023-12-31 LAB — CBC WITH DIFFERENTIAL/PLATELET
Abs Immature Granulocytes: 0.01 10*3/uL (ref 0.00–0.07)
Basophils Absolute: 0 10*3/uL (ref 0.0–0.1)
Basophils Relative: 1 %
Eosinophils Absolute: 0.1 10*3/uL (ref 0.0–0.5)
Eosinophils Relative: 2 %
HCT: 44.2 % (ref 36.0–46.0)
Hemoglobin: 15 g/dL (ref 12.0–15.0)
Immature Granulocytes: 0 %
Lymphocytes Relative: 25 %
Lymphs Abs: 1.6 10*3/uL (ref 0.7–4.0)
MCH: 29.9 pg (ref 26.0–34.0)
MCHC: 33.9 g/dL (ref 30.0–36.0)
MCV: 88.2 fL (ref 80.0–100.0)
Monocytes Absolute: 0.5 10*3/uL (ref 0.1–1.0)
Monocytes Relative: 8 %
Neutro Abs: 4.3 10*3/uL (ref 1.7–7.7)
Neutrophils Relative %: 64 %
Platelets: 385 10*3/uL (ref 150–400)
RBC: 5.01 MIL/uL (ref 3.87–5.11)
RDW: 12.8 % (ref 11.5–15.5)
WBC: 6.6 10*3/uL (ref 4.0–10.5)
nRBC: 0 % (ref 0.0–0.2)

## 2023-12-31 LAB — COMPREHENSIVE METABOLIC PANEL
ALT: 18 U/L (ref 0–44)
AST: 18 U/L (ref 15–41)
Albumin: 4.3 g/dL (ref 3.5–5.0)
Alkaline Phosphatase: 120 U/L (ref 38–126)
Anion gap: 5 (ref 5–15)
BUN: 14 mg/dL (ref 6–20)
CO2: 27 mmol/L (ref 22–32)
Calcium: 9.4 mg/dL (ref 8.9–10.3)
Chloride: 101 mmol/L (ref 98–111)
Creatinine, Ser: 0.82 mg/dL (ref 0.44–1.00)
GFR, Estimated: >60 mL/min (ref >60)
Glucose, Bld: 107 mg/dL — ABNORMAL HIGH (ref 70–99)
Potassium: 4.2 mmol/L (ref 3.5–5.1)
Sodium: 133 mmol/L — ABNORMAL LOW (ref 135–145)
Total Bilirubin: 0.6 mg/dL (ref 0.0–1.2)
Total Protein: 7.7 g/dL (ref 6.5–8.1)

## 2023-12-31 LAB — GLUCOSE, CAPILLARY
Glucose-Capillary: 78 mg/dL (ref 70–99)
Glucose-Capillary: 68 mg/dL — ABNORMAL LOW (ref 70–99)

## 2023-12-31 LAB — PHOSPHORUS: Phosphorus: 3.7 mg/dL (ref 2.5–4.6)

## 2023-12-31 LAB — MAGNESIUM: Magnesium: 2.2 mg/dL (ref 1.7–2.4)

## 2023-12-31 MED ORDER — ONDANSETRON 4 MG PO TBDP: 4.0000 mg | ORAL_TABLET | Freq: Three times a day (TID) | ORAL | 0 refills | Status: AC | PRN

## 2023-12-31 NOTE — Plan of Care (Signed)
 Pt received, educated, and understands AVS discharge instructions. Pt belongings all with pt.

## 2023-12-31 NOTE — TOC Progression Note (Signed)
 Transition of Care [facility]) - Progression Note    Patient Details  Name: Haley Harding MRN: [id number] Date of Birth: 02-Apr-1966  Transition of Care [facility]) CM/SW Contact  Mitzie LOISE [place], [place] Phone Number: 12/31/2023, 1:19 PM  Clinical Narrative:    Patient will be going home with family support system. Patient has coordinated transport home from the assistance of family. No identified needs at time of discharge. TOC signing off please reconsult with any other TOC needs.       Expected Discharge Plan and Services         Expected Discharge Date: 12/31/23                                     Social Determinants of Health (SDOH) Interventions SDOH Screenings   Food Insecurity: No Food Insecurity (12/29/2023)  Housing: Low Risk  (12/29/2023)  Transportation Needs: No Transportation Needs (12/29/2023)  Utilities: Not At Risk (12/29/2023)  Tobacco Use: Low Risk  (12/27/2023)    Readmission Risk Interventions     No data to display

## 2023-12-31 NOTE — Plan of Care (Signed)

## 2023-12-31 NOTE — Discharge Summary (Signed)
 Physician Discharge Summary   Patient: Haley Harding MRN: [id number] DOB: 05/11/1966  Admit date:     12/27/2023  Discharge date: 12/31/23  Discharge Physician: Alejandro Marker, DO   PCP: Sun, Vyvyan, MD   Recommendations at discharge:   Follow-up with PCP within 1-2 and repeat CBC, CMP, mag, Phos within 1 week Follow-up with Gastroenterology in outpatient setting if necessary  Discharge Diagnoses: Principal Problem:   Gastroenteritis  Resolved Problems:   * No resolved hospital problems. [facility] Course: The patient is a 58 year old overweight African-American female with a past medical history significant for but limited to hypertension, diabetes mellitus type 2 that is non-insulin -dependent, as well as other comorbidities including anemia, anxiety, and depression who presents to the hospital with abdominal pain and vomiting.  She states that she was in her usual state of health the evening before last when she started developing epigastric burning abdominal discomfort and pain which she thought was related to GERD.  She took some over-the-counter acid reducers but then after that she started vomiting and vomited about twice and then decided come to the ED for further evaluation.  She denies any changes in bowel or bladder habits or fevers or hematemesis or coffee-ground emesis.  Evaluation in the ED was relatively benign however she continued to vomit and states that she has severe pain so she was admitted for further evaluation. CT scan of the Abd/Pelvis done and showed no acute findings within the abdomen or pelvis. No findings to account for the patient's abdominal pain. No bowel obstruction or Inflammation patient also had a right upper quadrant ultrasound which showed No evidence of gallstones, cholecystitis or biliary dilatation. Mild fatty replacement of the liver.  Continued to have nausea and vomiting so we changed her antiemetics. She is slowly improving so will advance  Diet to a Soft diet today but if she fails to make any more progress will consider a GI consultation.   Assessment and Plan:  Nausea, Vomiting, and Abdominal Pain and Epigastric Burning  -Likely Viral Gastroenteritis -C/w Supportive Care -Given a 1 Liter Bolus yesterday and will change to D5W at 50 mL/h to D5 normal saline at 75 mL/hr x 1 Day which will expire today  -Continue with antiemetics with metoclopramide  10 mg IV every 6 as needed for nausea vomiting as well as Compazine  10 mg IV every 6 as needed for refractory nausea vomiting -Holding her home PPI with omeprazole  and will substitute with IV Pantoprazole  40 mg but changed to twice daily -Was on a full liquid diet and unable to tolerate this yesterday so today we will go to soft diet.  -CT scan and right upper quadrant ultrasound unrevealing as above -Symptoms improved significantly and she is back to her baseline had no more nausea or vomiting.  She is stable for discharge and will send her prescription for ondansetron  in case she becomes nauseous again  Essential HTN -Unable to tolerate much po adequately and will continue FLD -Add IV Hydralazine  10 mg q6hprn for SBP >160 or DBP >100 -Continue to Monitor BP per Protocol -Currently holding her amlodipine  5 mg p.o. daily, lisinopril  5 mg for daily, Toprol  tartrate 25 mL p.o. twice daily -Last BP reading was 151/94  Non-Insulin -Dependent Diabetes Mellitus Type 2 with multiple hypoglycemic episodes -Hemoglobin A1c was 5.9 and relatively well-controlled -Currently holding her glipizide 10 mg p.o. nightly but likely can be resumed at discharge show she is eating again more -C/w Moderate Novolog  SSI AC/HS -CBG and glucose  trend Recent Labs  Lab 12/29/23 2052 12/30/23 0750 12/30/23 1157 12/30/23 1630 12/30/23 2049 12/31/23 0742 12/31/23 1128  GLUCAP 83 91 94 102* 86 78 68*   Recent Labs  Lab 12/27/23 2243 12/29/23 0428 12/30/23 0538 12/31/23 0950  GLUCOSE 77 94 100* 107*   -IVF stopped -Follow-up in outpatient setting with PCP and have further medication adjustments at that time  Hyperlipidemia -Currently holding her Pravastatin 20 was p.o. daily until she is able to tolerate po intake regularly   Hyponatremia -Mild. Na+ Trend: Recent Labs  Lab 12/27/23 2243 12/29/23 0428 12/30/23 0538 12/31/23 0950  NA 139 141 139 133*  -Continue to Monitor and Trend and repeat CMP within 1 week  Depression and Anxiety -Currently holding her Sertraline  200 mg p.o. twice daily and Bupropion  300 was p.o. daily dose is able to tolerate p.o. intake regularly   Overweight -Complicates overall prognosis and care -Estimated body mass index is 27.31 kg/m as calculated from the following:   Height as of this encounter: 5' 7 (1.702 m).   Weight as of this encounter: 79.1 kg.  -Weight Loss and Dietary Counseling given  Consultants: None Procedures performed: As delineated as above Disposition: Home Diet recommendation:  Discharge Diet Orders (From admission, onward)     Start     Ordered   12/31/23 0000  Diet - low sodium heart healthy        12/31/23 1251           Cardiac diet DISCHARGE MEDICATION: Allergies as of 12/31/2023       Reactions   Duloxetine  Other (See Comments)   Headache   Semaglutide Other (See Comments)   Ozempic- GI Intolerance        Medication List     STOP taking these medications    acyclovir  800 MG tablet Commonly known as: ZOVIRAX    amLODipine  5 MG tablet Commonly known as: NORVASC    sertraline  100 MG tablet Commonly known as: ZOLOFT    traMADol  50 MG tablet Commonly known as: ULTRAM        TAKE these medications    buPROPion  300 MG 24 hr tablet Commonly known as: WELLBUTRIN  XL Take 300 mg by mouth daily.   glipiZIDE 10 MG 24 hr tablet Commonly known as: GLUCOTROL XL Take 10 mg by mouth daily with breakfast.   Ibuprofen 200 MG Caps Take 200-600 mg by mouth every 6 (six) hours as needed (for  headaches).   ipratropium 0.06 % nasal spray Commonly known as: ATROVENT Place 1 spray into both nostrils daily as needed for rhinitis.   lisinopril  20 MG tablet Commonly known as: ZESTRIL  Take 20 mg by mouth daily.   metoprolol  tartrate 50 MG tablet Commonly known as: LOPRESSOR  Take 50 mg by mouth in the morning.   omeprazole  40 MG capsule Commonly known as: PRILOSEC Take 40 mg by mouth daily before breakfast. What changed: Another medication with the same name was removed. Continue taking this medication, and follow the directions you see here.   ondansetron  4 MG disintegrating tablet Commonly known as: ZOFRAN -ODT Take 1 tablet (4 mg total) by mouth every 8 (eight) hours as needed for nausea or vomiting.   pravastatin 20 MG tablet Commonly known as: PRAVACHOL Take 20 mg by mouth daily.   tretinoin 0.05 % cream Commonly known as: RETIN-A Apply 1 Application topically daily as needed (to increase collagen production).   valACYclovir 500 MG tablet Commonly known as: VALTREX Take 500 mg by mouth 2 (two) times daily as  needed (for outbreaks- for 3 days).        Follow-up Information     Sun, Vyvyan, MD.   Specialty: Family Medicine Contact information: [address] [place] [address] [phone]                Discharge Exam: Fredricka Weights   12/27/23 2222 12/28/23 1600  Weight: 80.7 kg 79.1 kg   Vitals:   12/31/23 1157 12/31/23 1246  BP: (!) 154/101 (!) 132/90  Pulse: 75 67  Resp: 18   Temp: 97.6 F (36.4 C)   SpO2: 100%    Examination: Physical Exam:  Constitutional: WN/WD overweight African-American female no acute distress appears calm Respiratory: Diminished to auscultation bilaterally, no wheezing, rales, rhonchi or crackles. Normal respiratory effort and patient is not tachypenic. No accessory muscle use.  Unlabored breathing Cardiovascular: RRR, no murmurs / rubs / gallops. S1 and S2 auscultated. No extremity  edema. Abdomen: Soft, non-tender, distended secondary to body habitus. Bowel sounds positive.  GU: Deferred. Musculoskeletal: No clubbing / cyanosis of digits/nails. No joint deformity upper and lower extremities. Skin: No rashes, lesions, ulcers on the skin evaluation. No induration; Warm and dry.  Neurologic: CN 2-12 grossly intact with no focal deficits. Romberg sign and cerebellar reflexes not assessed.  Psychiatric: Normal judgment and insight. Alert and oriented x 3. Normal mood and appropriate affect.   Condition at discharge: stable  The results of significant diagnostics from this hospitalization (including imaging, microbiology, ancillary and laboratory) are listed below for reference.   Imaging Studies: CT ABDOMEN PELVIS W CONTRAST Result Date: 12/28/2023 CLINICAL DATA:  Abdominal pain beginning yesterday morning. Vomiting. EXAM: CT ABDOMEN AND PELVIS WITH CONTRAST TECHNIQUE: Multidetector CT imaging of the abdomen and pelvis was performed using the standard protocol following bolus administration of intravenous contrast. RADIATION DOSE REDUCTION: This exam was performed according to the departmental dose-optimization program which includes automated exposure control, adjustment of the mA and/or kV according to patient size and/or use of iterative reconstruction technique. CONTRAST:  OMNIPAQUE  IOHEXOL  300 MG/ML  SOLN COMPARISON:  06/21/2022. FINDINGS: Lower chest: No acute abnormality. Hepatobiliary: Liver normal in size. Multiple low-attenuation liver masses measuring up to 1.4 cm are stable consistent with cysts. Focal hypoattenuation noted anteriorly along the falciform ligament consistent with focal fatty infiltration, also stable. Normal gallbladder. No bile duct dilation. Pancreas: Unremarkable. No pancreatic ductal dilatation or surrounding inflammatory changes. Spleen: Normal in size without focal abnormality. Adrenals/Urinary Tract: Adrenal glands are unremarkable. Kidneys are  normal, without renal calculi, focal lesion, or hydronephrosis. Bladder is unremarkable. Stomach/Bowel: Small hiatal hernia. Stomach otherwise unremarkable. Small bowel and colon are normal in caliber. No wall thickening or inflammation. Normal appendix visualized. Vascular/Lymphatic: No significant vascular findings are present. No enlarged abdominal or pelvic lymph nodes. Reproductive: Uterus enlarged with a lobulated contour consistent with multiple fibroids, stable. No adnexal masses. Other: No abdominal wall hernia or abnormality. No abdominopelvic ascites. Musculoskeletal: No fracture or acute finding. No bone lesion. Degenerative changes at L5-S1 with a grade 1 anterolisthesis. IMPRESSION: 1. No acute findings within the abdomen or pelvis. No findings to account for the patient's abdominal pain. No bowel obstruction or inflammation. Electronically Signed   By: Alm Parkins M.D.   On: 12/28/2023 07:27   US  Abdomen Limited RUQ (LIVER/GB) Result Date: 12/28/2023 CLINICAL DATA:  Right upper quadrant pain. EXAM: ULTRASOUND ABDOMEN LIMITED RIGHT UPPER QUADRANT COMPARISON:  CT with IV contrast 06/21/2022 FINDINGS: Gallbladder: No gallstones or wall thickening visualized. No sonographic  Murphy sign noted by sonographer. Common bile duct: Diameter: 2.1 mm, with no intrahepatic bile duct dilatation. Liver: No focal lesion identified. There is mild increased parenchymal echogenicity consistent with fatty replacement. This was seen previously. Portal vein is patent on color Doppler imaging with normal direction of blood flow towards the liver. There is no portal vein dilatation. Other: None. IMPRESSION: 1. No evidence of gallstones, cholecystitis or biliary dilatation. 2. Mild fatty replacement of the liver. Electronically Signed   By: Francis Quam M.D.   On: 12/28/2023 05:16   Microbiology: Results for orders placed or performed during the hospital encounter of 06/16/13  Urine culture     Status: None    Collection Time: 06/16/13  1:14 PM   Specimen: Urine, Clean Catch  Result Value Ref Range Status   Specimen Description URINE, CLEAN CATCH  Final   Special Requests NONE  Final   Culture  Setup Time 06/16/2013 21:00  Final   Colony Count >=100,000 COLONIES/ML  Final   Culture   Final    Multiple bacterial morphotypes present, none predominant. Suggest appropriate recollection if clinically indicated.   Report Status 06/18/2013 FINAL  Final   Labs: CBC: Recent Labs  Lab 12/27/23 2243 12/29/23 0428 12/30/23 0538 12/31/23 0950  WBC 7.9 5.4 4.7 6.6  NEUTROABS  --   --  2.9 4.3  HGB 15.3* 13.9 14.0 15.0  HCT 46.2* 41.2 40.7 44.2  MCV 89.0 88.6 86.6 88.2  PLT 364 325 318 385   Basic Metabolic Panel: Recent Labs  Lab 12/27/23 2243 12/29/23 0428 12/30/23 0538 12/31/23 0950  NA 139 141 139 133*  K 4.2 4.1 4.0 4.2  CL 105 103 105 101  CO2 22 26 25 27   GLUCOSE 77 94 100* 107*  BUN 18 18 8 14   CREATININE 0.60 0.82 0.63 0.82  CALCIUM  9.7 9.3 9.2 9.4  MG  --   --  2.1 2.2  PHOS  --   --  3.3 3.7   Liver Function Tests: Recent Labs  Lab 12/27/23 2243 12/30/23 0538 12/31/23 0950  AST 22 17 18   ALT 24 18 18   ALKPHOS 140* 114 120  BILITOT 0.8 0.6 0.6  PROT 7.7 7.0 7.7  ALBUMIN 4.4 3.8 4.3   CBG: Recent Labs  Lab 12/30/23 1157 12/30/23 1630 12/30/23 2049 12/31/23 0742 12/31/23 1128  GLUCAP 94 102* 86 78 68*   Discharge time spent: greater than 30 minutes.  Signed: Alejandro Marker, DO [facility] 01/03/2024

## 2024-01-15 DIAGNOSIS — I10 Essential (primary) hypertension: Secondary | ICD-10-CM | POA: Diagnosis not present

## 2024-01-15 DIAGNOSIS — A084 Viral intestinal infection, unspecified: Secondary | ICD-10-CM | POA: Diagnosis not present

## 2024-01-15 DIAGNOSIS — E119 Type 2 diabetes mellitus without complications: Secondary | ICD-10-CM | POA: Diagnosis not present

## 2024-03-08 DIAGNOSIS — M79672 Pain in left foot: Secondary | ICD-10-CM | POA: Diagnosis not present

## 2024-03-08 DIAGNOSIS — M79675 Pain in left toe(s): Secondary | ICD-10-CM | POA: Diagnosis not present

## 2024-03-08 DIAGNOSIS — S92512A Displaced fracture of proximal phalanx of left lesser toe(s), initial encounter for closed fracture: Secondary | ICD-10-CM | POA: Diagnosis not present

## 2024-04-21 DIAGNOSIS — I10 Essential (primary) hypertension: Secondary | ICD-10-CM | POA: Diagnosis not present

## 2024-04-21 DIAGNOSIS — S92512K Displaced fracture of proximal phalanx of left lesser toe(s), subsequent encounter for fracture with nonunion: Secondary | ICD-10-CM | POA: Diagnosis not present

## 2024-04-21 DIAGNOSIS — M79675 Pain in left toe(s): Secondary | ICD-10-CM | POA: Diagnosis not present

## 2024-05-14 DIAGNOSIS — E1169 Type 2 diabetes mellitus with other specified complication: Secondary | ICD-10-CM | POA: Diagnosis not present

## 2024-05-14 DIAGNOSIS — F324 Major depressive disorder, single episode, in partial remission: Secondary | ICD-10-CM | POA: Diagnosis not present

## 2024-05-14 DIAGNOSIS — E7800 Pure hypercholesterolemia, unspecified: Secondary | ICD-10-CM | POA: Diagnosis not present

## 2024-05-14 DIAGNOSIS — I10 Essential (primary) hypertension: Secondary | ICD-10-CM | POA: Diagnosis not present

## 2024-05-20 DIAGNOSIS — M79672 Pain in left foot: Secondary | ICD-10-CM | POA: Diagnosis not present

## 2024-05-20 DIAGNOSIS — S92515A Nondisplaced fracture of proximal phalanx of left lesser toe(s), initial encounter for closed fracture: Secondary | ICD-10-CM | POA: Diagnosis not present

## 2024-06-08 DIAGNOSIS — Z1231 Encounter for screening mammogram for malignant neoplasm of breast: Secondary | ICD-10-CM | POA: Diagnosis not present

## 2024-06-08 DIAGNOSIS — Z1239 Encounter for other screening for malignant neoplasm of breast: Secondary | ICD-10-CM | POA: Diagnosis not present

## 2024-08-14 DIAGNOSIS — F324 Major depressive disorder, single episode, in partial remission: Secondary | ICD-10-CM | POA: Diagnosis not present

## 2024-08-14 DIAGNOSIS — E119 Type 2 diabetes mellitus without complications: Secondary | ICD-10-CM | POA: Diagnosis not present

## 2024-08-14 DIAGNOSIS — I10 Essential (primary) hypertension: Secondary | ICD-10-CM | POA: Diagnosis not present

## 2024-11-19 DIAGNOSIS — Z0001 Encounter for general adult medical examination with abnormal findings: Secondary | ICD-10-CM | POA: Diagnosis not present

## 2024-11-19 DIAGNOSIS — Z8742 Personal history of other diseases of the female genital tract: Secondary | ICD-10-CM | POA: Diagnosis not present

## 2024-11-19 DIAGNOSIS — R87610 Atypical squamous cells of undetermined significance on cytologic smear of cervix (ASC-US): Secondary | ICD-10-CM | POA: Diagnosis not present

## 2024-11-19 DIAGNOSIS — Z1151 Encounter for screening for human papillomavirus (HPV): Secondary | ICD-10-CM | POA: Diagnosis not present

## 2024-11-19 DIAGNOSIS — Z124 Encounter for screening for malignant neoplasm of cervix: Secondary | ICD-10-CM | POA: Diagnosis not present

## 2024-11-26 DIAGNOSIS — F324 Major depressive disorder, single episode, in partial remission: Secondary | ICD-10-CM | POA: Diagnosis not present

## 2024-11-26 DIAGNOSIS — E119 Type 2 diabetes mellitus without complications: Secondary | ICD-10-CM | POA: Diagnosis not present

## 2024-11-26 DIAGNOSIS — Z0000 Encounter for general adult medical examination without abnormal findings: Secondary | ICD-10-CM | POA: Diagnosis not present

## 2024-11-26 DIAGNOSIS — E559 Vitamin D deficiency, unspecified: Secondary | ICD-10-CM | POA: Diagnosis not present

## 2024-11-26 DIAGNOSIS — Z23 Encounter for immunization: Secondary | ICD-10-CM | POA: Diagnosis not present

## 2024-11-26 DIAGNOSIS — I10 Essential (primary) hypertension: Secondary | ICD-10-CM | POA: Diagnosis not present

## 2024-11-26 DIAGNOSIS — E7800 Pure hypercholesterolemia, unspecified: Secondary | ICD-10-CM | POA: Diagnosis not present
